# Patient Record
Sex: Female | Born: 1935 | Race: White | Hispanic: No | Smoking: Never smoker
Health system: Southern US, Community
[De-identification: ages and names within clinical notes are randomized; demographics above are authoritative.]

## PROBLEM LIST (undated history)

## (undated) ENCOUNTER — Emergency Department: Discharge status: Absent without leave | Payer: Medicare Other

## (undated) DIAGNOSIS — I38 Endocarditis, valve unspecified: Secondary | ICD-10-CM

## (undated) DIAGNOSIS — I5189 Other ill-defined heart diseases: Secondary | ICD-10-CM

## (undated) DIAGNOSIS — R51 Headache with orthostatic component, not elsewhere classified: Secondary | ICD-10-CM

## (undated) DIAGNOSIS — R52 Pain, unspecified: Secondary | ICD-10-CM

## (undated) DIAGNOSIS — I779 Disorder of arteries and arterioles, unspecified: Secondary | ICD-10-CM

## (undated) DIAGNOSIS — E039 Hypothyroidism, unspecified: Secondary | ICD-10-CM

## (undated) DIAGNOSIS — I209 Angina pectoris, unspecified: Secondary | ICD-10-CM

## (undated) DIAGNOSIS — E785 Hyperlipidemia, unspecified: Secondary | ICD-10-CM

## (undated) DIAGNOSIS — I447 Left bundle-branch block, unspecified: Secondary | ICD-10-CM

## (undated) DIAGNOSIS — K551 Chronic vascular disorders of intestine: Secondary | ICD-10-CM

## (undated) DIAGNOSIS — R42 Dizziness and giddiness: Secondary | ICD-10-CM

## (undated) DIAGNOSIS — I2510 Atherosclerotic heart disease of native coronary artery without angina pectoris: Secondary | ICD-10-CM

## (undated) DIAGNOSIS — I480 Paroxysmal atrial fibrillation: Secondary | ICD-10-CM

## (undated) DIAGNOSIS — H9190 Unspecified hearing loss, unspecified ear: Secondary | ICD-10-CM

## (undated) DIAGNOSIS — M1990 Unspecified osteoarthritis, unspecified site: Secondary | ICD-10-CM

## (undated) DIAGNOSIS — I10 Essential (primary) hypertension: Secondary | ICD-10-CM

## (undated) DIAGNOSIS — K819 Cholecystitis, unspecified: Secondary | ICD-10-CM

## (undated) DIAGNOSIS — J439 Emphysema, unspecified: Secondary | ICD-10-CM

## (undated) DIAGNOSIS — R011 Cardiac murmur, unspecified: Secondary | ICD-10-CM

## (undated) DIAGNOSIS — K219 Gastro-esophageal reflux disease without esophagitis: Secondary | ICD-10-CM

## (undated) HISTORY — DX: Other ill-defined heart diseases: I51.89

## (undated) HISTORY — DX: Hyperlipidemia, unspecified: E78.5

## (undated) HISTORY — DX: Disorder of arteries and arterioles, unspecified: I77.9

## (undated) HISTORY — PX: CATARACT EXTRACTION: SUR2

## (undated) HISTORY — DX: Endocarditis, valve unspecified: I38

## (undated) HISTORY — DX: Dizziness and giddiness: R42

## (undated) HISTORY — PX: CORONARY ANGIOPLASTY: SHX604

## (undated) HISTORY — DX: Cholecystitis, unspecified: K81.9

## (undated) HISTORY — DX: Headache: R51

## (undated) HISTORY — DX: Essential (primary) hypertension: I10

## (undated) HISTORY — PX: KNEE SURGERY: SHX244

## (undated) HISTORY — DX: Atherosclerotic heart disease of native coronary artery without angina pectoris: I25.10

## (undated) HISTORY — DX: Cardiac murmur, unspecified: R01.1

## (undated) HISTORY — DX: Left bundle-branch block, unspecified: I44.7

## (undated) HISTORY — DX: Chronic vascular disorders of intestine: K55.1

## (undated) HISTORY — PX: BREAST BIOPSY: SHX20

## (undated) HISTORY — DX: Gastro-esophageal reflux disease without esophagitis: K21.9

## (undated) HISTORY — DX: Headache with orthostatic component, not elsewhere classified: R51.0

## (undated) HISTORY — DX: Paroxysmal atrial fibrillation: I48.0

## (undated) HISTORY — DX: Emphysema, unspecified: J43.9

---
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
## 1968-07-17 HISTORY — PX: OTHER SURGICAL HISTORY: SHX169

## 1998-07-17 HISTORY — PX: CARDIAC CATHETERIZATION: SHX172

## 1999-07-18 HISTORY — PX: CORONARY ARTERY BYPASS GRAFT: SHX141

## 2004-05-10 ENCOUNTER — Ambulatory Visit: Payer: Self-pay | Admitting: Ophthalmology

## 2004-05-17 ENCOUNTER — Ambulatory Visit: Payer: Self-pay | Admitting: Ophthalmology

## 2004-10-31 ENCOUNTER — Ambulatory Visit: Payer: Self-pay | Admitting: Internal Medicine

## 2005-12-01 ENCOUNTER — Ambulatory Visit: Payer: Self-pay | Admitting: Internal Medicine

## 2006-09-21 ENCOUNTER — Ambulatory Visit: Payer: Self-pay | Admitting: Cardiology

## 2007-02-15 ENCOUNTER — Ambulatory Visit: Payer: Self-pay | Admitting: Internal Medicine

## 2007-02-21 ENCOUNTER — Ambulatory Visit: Payer: Self-pay | Admitting: Internal Medicine

## 2008-03-17 ENCOUNTER — Ambulatory Visit: Payer: Self-pay | Admitting: Cardiology

## 2008-03-24 ENCOUNTER — Ambulatory Visit: Payer: Self-pay

## 2008-03-26 ENCOUNTER — Ambulatory Visit: Payer: Self-pay | Admitting: Internal Medicine

## 2008-09-22 ENCOUNTER — Ambulatory Visit: Payer: Self-pay | Admitting: Cardiology

## 2009-04-07 ENCOUNTER — Ambulatory Visit: Payer: Self-pay | Admitting: Internal Medicine

## 2009-04-15 ENCOUNTER — Encounter: Payer: Self-pay | Admitting: Cardiology

## 2009-04-15 ENCOUNTER — Ambulatory Visit: Payer: Self-pay | Admitting: Cardiology

## 2009-04-15 DIAGNOSIS — I25810 Atherosclerosis of coronary artery bypass graft(s) without angina pectoris: Secondary | ICD-10-CM | POA: Insufficient documentation

## 2009-04-15 DIAGNOSIS — I447 Left bundle-branch block, unspecified: Secondary | ICD-10-CM

## 2009-07-17 HISTORY — PX: BREAST BIOPSY: SHX20

## 2010-04-12 ENCOUNTER — Ambulatory Visit: Payer: Self-pay | Admitting: Internal Medicine

## 2010-04-13 ENCOUNTER — Ambulatory Visit: Payer: Self-pay | Admitting: Internal Medicine

## 2010-05-16 ENCOUNTER — Ambulatory Visit: Payer: Self-pay | Admitting: General Surgery

## 2010-06-10 ENCOUNTER — Ambulatory Visit: Payer: Self-pay | Admitting: Internal Medicine

## 2010-06-10 ENCOUNTER — Encounter: Payer: Self-pay | Admitting: Internal Medicine

## 2010-06-10 DIAGNOSIS — K219 Gastro-esophageal reflux disease without esophagitis: Secondary | ICD-10-CM

## 2010-06-10 DIAGNOSIS — R011 Cardiac murmur, unspecified: Secondary | ICD-10-CM | POA: Insufficient documentation

## 2010-08-16 NOTE — Assessment & Plan Note (Signed)
Summary: EC6/AMD   Visit Type:  Follow-up Referring Provider:  Maisie Fus Wall,M.D. Primary Provider:  Elio Forget.  CC:  Denies chest pain or shortness of breath..  History of Present Illness: Kayla [name] is an 77 y/o woman with h/o CAD s/p CABG in 2001 at [facility], LBBB, GERD and HTN. Myoview in 2009 was normal.   Previously followed by Dr. Daleen Squibb. Here for routine f/u.   Doing well. 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 Denies CP or SOB. No problems with meds. Only complaint is that she has severe GERD which is well controlled with Zantac. BP well controlled. Cholesterol recently checked by Dr. Welton Flakes - and was told it was good.   Current Medications (verified): 1)  Levothyroxine Sodium 100 Mcg Tabs (Levothyroxine Sodium) .[name].. 1 By Mouth Once Daily 2)  Amlodipine Besylate 2.5 Mg Tabs (Amlodipine Besylate) .... Take One Tablet By Mouth Daily 3)  Diovan Hct 320-12.5 Mg Tabs (Valsartan-Hydrochlorothiazide) .[name].. 1 By Mouth Once Daily 4)  Aspir-Low 81 Mg Tbec (Aspirin) .... One Tablet Once Daily 5)  B Complex-B12  Tabs (B Complex Vitamins) .... Daily 6)  Zantac 150 Mg Tabs (Ranitidine Hcl) .... One Tablet Once Daily 7)  Vitamin D3 2000 Unit Caps (Cholecalciferol) .... One Tablet Once Daily 8)  Calcium/vitamin D/minerals 600-200 Mg-Unit Tabs (Calcium Carbonate-Vit D-Min) .... One Tablet Daily  Allergies (verified): No Known Drug Allergies  Past History:  Past Medical History: Last updated: April 23, 2009 CAD Hypertension Mild orthostatics Chronic left bundle branch block  Past Surgical History: Last updated: 2009/04/23 CBAG Right leg steel plate implant in right eye thyroid   Family History: Last updated: 04/23/2009 Father: deceased 70; accident Mother:deceased 79; heart failure Siblings:4 sisters-livinig 1 sister deceaed 59; heart failure 1 brother deceased 26; heart attack 1 brother: deceaed 53; freak accident  Social History: Last updated: 04-23-09 Part Time   Widowed  Tobacco Use - No.  Alcohol Use - no Regular Exercise - yes Drug Use - no  Risk Factors: Exercise: yes (04/23/2009)  Risk Factors: Smoking Status: never (April 23, 2009)  Review of Systems       As per HPI and past medical history; otherwise all systems negative.   Vital Signs:  Patient profile:   77 year old female Height:      62 inches Weight:      166.50 pounds BMI:     30.56 Pulse rate:   62 / minute BP sitting:   120 / 68  (left arm) Cuff size:   large  Vitals Entered By: Bishop Dublin, CMA (June 10, 2010 12:41 PM)  Physical Exam  General:  Elderly well appearing. no resp difficulty HEENT: normal Neck: supple. no JVD. Carotids 2+ bilat; no bruits. No lymphadenopathy or thryomegaly appreciated. Cor: PMI nondisplaced. Regular rate & rhythm. No rubs, gallops. soft 2/6 SEM at RUSB. S2 preserved  Lungs: clear Abdomen: soft, nontender, nondistended. No hepatosplenomegaly. No bruits or masses. Good bowel sounds. Extremities: no cyanosis, clubbing, rash, edema Neuro: alert & orientedx3, cranial nerves grossly intact. moves all 4 extremities w/o difficulty. affect pleasant    Impression & Recommendations:  Problem # 1:  CAD, AUTOLOGOUS BYPASS GRAFT (ICD-414.02) Stable. No evidence of ischemia. Continue current regimen.  Problem # 2:  MURMUR (ICD-785.2) Appears to have mild aortic sclerosis on exam - doubt signifcant stenosis. Will continue to follow. Can cosnider ech in future as needed.   Problem # 3:  HEARTBURN (ICD-787.1) Suggested that if this gets worse may want to try PPI rather than  H2 blocker. She will f/u with Dr. Park Breed.   Patient Instructions: 1)  Your physician recommends that you schedule a follow-up appointment in: 1 year 2)  Your physician recommends that you continue on your current medications as directed. Please refer to the Current Medication list given to you today.

## 2010-11-23 ENCOUNTER — Ambulatory Visit: Payer: Self-pay | Admitting: General Surgery

## 2010-11-29 NOTE — Assessment & Plan Note (Signed)
[facility] OFFICE NOTE   NAME:Kayla [name]                        MRN:          [id number]  DATE:03/17/2008                            DOB:          1927/07/16    [name] returns today for further management of her coronary artery  disease and left bundle-branch block.   I saw her last on September 21, 2006.  Since that time, she has had some  increased fatigue and dyspnea on exertion.  She also has some occasional  exertional chest discomfort when she gets on her exercise bike.  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   She is status post coronary artery bypass grafting x3 by Dr. Quincy Simmonds at [facility] in 2001.  I have yet to receive records that  requested.   She has no known allergies.   Her current meds are:  1. Centrum Multivitamin.  2. Levothyroxine 100 mcg a day.  3. B12 shot every 30 days.  4. Aspirin 81 mg a day.  5. Triamterene/hydrochlorothiazide 37.5/25 mg 1 daily.  6. Diovan 160 mg a day.   She does not smoke.  She does use caffeine.   PHYSICAL EXAMINATION:  GENERAL:  Today, she is very pleasant.  VITAL SIGNS:  Her blood pressure is 140/80, pulse 64 and regular, and  she weighs 173, which is actually up 16 pounds since a 77-year and half  ago.  HEENT:  Normocephalic and atraumatic.  PERRLA.  Extraocular movements  are intact.  Sclerae are clear.  Facial symmetry is normal.  Dentition  satisfactory.  NECK:  Carotid upstrokes are equal bilaterally without bruits.  No JVD.  Thyroid is not enlarged.  Trachea is midline.  LUNGS:  Clear to auscultation and percussion.  HEART:  A nondisplaced PMI.  There is a soft systolic murmur along the  left sternal border of no clinical consequence.  S2 splits.  ABDOMEN:  Soft.  Good bowel sounds.  No pulsatile mass.  EXTREMITIES:  No edema.  Pulses are present.  She has some venous  varicosities.  No sign of  DVT.  MUSCULOSKELETAL:  No major thoracic or back deformities.  She does have  chronic arthritic changes.  SKIN:  No significant ecchymoses.   EKG shows normal sinus rhythm with a left bundle, which is old.   ASSESSMENT AND PLAN:  Kayla [name] seems to be having maybe some  exertional angina.  That was hard to sort out.  Her weight has gone up  substantially.  A lot of this could be just deconditioning.   PLAN:  1. Adenosine rest stress Myoview.  We need to quantitate her LV      function as well as evaluate any significant ischemia.  We have to      interpret this in light of her left bundle.  2. I have made no change in her medical program.   Assuming that her adenosine Myoview is unremarkable, we will see her  back in a year.     Maisie Fus  Argie Ramming, MD, [facility]  Electronically Signed    TCW/MedQ  DD: 03/17/2008  DT: 03/18/2008  Job #: [id number]   cc:   Beverely Risen, MD

## 2010-11-29 NOTE — Assessment & Plan Note (Signed)
[facility] OFFICE NOTE   NAME:Kayla [name], Kayla [name]                        MRN:          [id number]  DATE:09/22/2008                            DOB:          [birth year]    Kayla [name] comes in today.  She has had some nonspecific dizziness,  sometimes posture related but mostly just spontaneously.  She has had a  little bit of chest tightness here and there, but is not clearly  exertional.   She has known coronary artery disease.  Last visit, please refer to that  note, we were concerned about some inconsistent chest discomfort.  Stress Myoview showed EF of 68% with no ischemia.   She saw Dr. Beverely Risen, who ordered a Holter monitor.  I have reviewed  this in entirety.  It shows sinus rhythm, sinus brady, and her left  bundle-branch block, which is old.  Her symptoms correlate with sinus  brady and sinus rhythm.  I have reviewed these and agreed with the  findings.  She does have some PVCs, but they are infrequent.  She also  has a PACs, but no significant sustained SVT and certainly no V-tach.   Her blood pressure has been elevated and she has been having some  headaches.  Dr. Welton Flakes advised to go up on her Diovan from 160 to 320, but  she has not done that yet.   Her daughter is with her today.   Her meds are the same as last visit, except now she is on Flonase,  Zyrtec, and some calcium and D.   PHYSICAL EXAMINATION:  VITAL SIGNS:  Her blood pressure today is 180/80,  her pulse is 66 and regular, and her weight is 170.4.  GENERAL:  She is alert and oriented x3.  She is overweight.  Skin is  warm and dry.  Her affect is depressed.  She is a poor historian.  HEENT:  Essentially normal.  NECK:  Supple.  Carotid upstrokes were equal bilaterally without bruits.  Thyroid is not enlarged.  Trachea is midline.  LUNGS:  Clear to auscultation and percussion.  HEART:  A nondisplaced PMI, normal S1 paradoxus S2.  ABDOMEN:   Soft, good bowel sounds.  EXTREMITIES:  No edema.  Pulses are present.   Apparently, she has had a carotid Doppler which showed minimal plaque.  I do not have a copy of this.   EKG demonstrates normal sinus rhythm with a left bundle, which is old.   ASSESSMENT:  1. Essential hypertension, poorly controlled systolically.  This may      be associated with some of her symptoms.  2. Mild orthostatic symptoms.  3. Stable coronary artery disease.  4. Chronic left bundle-branch block.  5. Symptoms of dizziness, do not correlate with an arrhythmia.   RECOMMENDATIONS:  1. Increase Diovan as outlined by Dr. Welton Flakes.  2. Check blood pressure at home with a goal of less than or equal to      140/90.   I will see her back in 6 months.      Thomas C. Wall,  MD, [facility]  Electronically Signed    TCW/MedQ  DD: 09/22/2008  DT: 09/22/2008  Job #: [id number]   cc:   Beverely Risen, MD

## 2010-12-02 NOTE — Assessment & Plan Note (Signed)
[facility]                            [place] OFFICE NOTE   NAME:Kayla [name], Kayla [name]                        MRN:          [id number]  DATE:09/21/2006                            DOB:          03-31-1927    I was asked by Dr. Welton Flakes to evaluate and establish the new patient,  Kayla [name].  She is a delightful 77 year old widowed white female,  mother of a patient of mine, who comes today for the above reason.   She is having no complaints of angina or ischemia.  She denies any  orthopnea, PND, or peripheral edema.   She has had coronary artery bypass grafting x3 by Dr. Quincy Simmonds at  [facility] in 2001.  Details are not known.  She has also been told  that she has a leaky valve, but really does not need to be too concerned  about it.   PAST MEDICAL HISTORY:  She has no known dye allergies.   CURRENT MEDICATIONS:  1. Centrum.  2. Multivitamin.  3. Aspirin 325 a day.  4. Levoxyl 112 mcg a day.  5. Diovan 160 daily.  6. Triamterine/hydrochlorothiazide 37.5/25 daily.   She does not smoke, drink, or use any illicit drugs.  She does use  caffeine.  She does enjoy her stationary bike and walking.   Her previous surgeries, other than her bypass, she has had a steel plate  put in her right leg.  She has had a thyroidectomy in 1979.   FAMILY HISTORY:  Remarkable for heart disease, particularly prematurely  in her brother, who died at age 39.   SOCIAL HISTORY:  She is retired.  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  She  is widowed and has 6 children.  She lives just down the street here in  [place].   REVIEW OF SYSTEMS:  All review of systems points were reviewed.  She  does have chronic arthritis and the thyroid disease mentioned above.   EXAM:  Her blood pressure is 126/78, pulse is 68 and regular, weight is  157.  She is 5 feet 1 inches.  HEENT:  Normocephalic, atraumatic.  PERRLA.  Extraocular muscles are  intact.  Sclerae clear.  Facial  symmetry is normal.  PERRLA.  Extraocular movements intact.  Carotid upstrokes are equal bilaterally without bruits.  There is no  JVD.  Thyroid is not enlarged.  Neck is supple.  LUNGS:  Clear to auscultation.  HEART:  Reveals a normal S1 and a paradoxically split S2.  She has a  left bundle on her EKG.  There is a soft systolic murmur along the left  sternal border.  I could not here any mitral regurgitation murmur.  ABDOMEN:  Soft with good bowel sounds.  No midline bruit.  There is no  hepatomegaly.  EXTREMITIES:  No cyanosis or clubbing.  She does have 1+ bilateral  pitting edema.  SHE has some distal varicosities in the lower  extremities.  NEURO:  Exam is intact.   EKG shows normal sinus rhythm with a left bundle branch block.   ASSESSMENT:  1. Coronary  artery disease status post coronary artery bypass grafting      x3, details unknown.  She is currently having no symptoms of      angina, ischemia, or congestive heart failure.  2. Hypertension, well-controlled.  3. Question lipid status.  She is not on a Statin, but she says that      her cholesterol is really good.  I will leave this to Dr. Welton Flakes.  4. Left bundle branch block.  I assume this is old.   Her last evaluation was by Dr. Welton Flakes, cardiologist here in [place].  We will send request for his records, including what sounds like an  echocardiogram, as well as an EKG and perhaps a stress test.   I will see her back again in a year otherwise.     Thomas C. Daleen Squibb, MD, [facility]  Electronically Signed    TCW/MedQ  DD: 09/21/2006  DT: 09/21/2006  Job #: [id number]   cc:   Dr. Everlean Cherry

## 2011-01-02 ENCOUNTER — Encounter: Payer: Self-pay | Admitting: Cardiovascular Disease

## 2011-06-01 ENCOUNTER — Ambulatory Visit: Payer: Self-pay | Admitting: General Surgery

## 2011-06-23 ENCOUNTER — Encounter: Payer: Self-pay | Admitting: Cardiovascular Disease

## 2011-07-07 ENCOUNTER — Ambulatory Visit (INDEPENDENT_AMBULATORY_CARE_PROVIDER_SITE_OTHER): Payer: Medicare PPO | Admitting: Cardiovascular Disease

## 2011-07-07 ENCOUNTER — Encounter: Payer: Self-pay | Admitting: Cardiovascular Disease

## 2011-07-07 DIAGNOSIS — E785 Hyperlipidemia, unspecified: Secondary | ICD-10-CM | POA: Insufficient documentation

## 2011-07-07 DIAGNOSIS — R011 Cardiac murmur, unspecified: Secondary | ICD-10-CM

## 2011-07-07 DIAGNOSIS — R12 Heartburn: Secondary | ICD-10-CM

## 2011-07-07 DIAGNOSIS — E782 Mixed hyperlipidemia: Secondary | ICD-10-CM | POA: Insufficient documentation

## 2011-07-07 DIAGNOSIS — I25810 Atherosclerosis of coronary artery bypass graft(s) without angina pectoris: Secondary | ICD-10-CM

## 2011-07-07 MED ORDER — LOVASTATIN 20 MG PO TABS: 20.0000 mg | ORAL_TABLET | Freq: Every day | ORAL | Status: DC

## 2011-07-07 NOTE — Assessment & Plan Note (Signed)
Suspect mid degree of aortic valve stenosis. No symptoms.

## 2011-07-07 NOTE — Assessment & Plan Note (Signed)
Not at goal LDL <70. H/o CABG Will start lovastatin 20 mg daily

## 2011-07-07 NOTE — Patient Instructions (Signed)
You are doing well. Please start lovastatin one a day in the evening  Please call us if you have new issues that need to be addressed before your next appt.  Your physician wants you to follow-up in: 6 months.  You will receive a reminder letter in the mail two months in advance. If you don't receive a letter, please call our office to schedule the follow-up appointment.

## 2011-07-07 NOTE — Assessment & Plan Note (Signed)
Currently with no symptoms of angina. No further workup at this time. Not on a statin.

## 2011-07-07 NOTE — Progress Notes (Signed)
Patient ID: Kayla [name], female    DOB: 1926/12/06, 77 y.o.   MRN: [id number]  HPI Comments: Kayla [name] is an 77 y/o woman with h/o CAD s/p CABG in 2001 at [facility], LBBB, hyperlipidemia, GERD and HTN.  Myoview in 2009 was normal.  Here for routine f/u.     Doing well. 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 Denies CP or SOB. No problems with meds.BP well controlled. She does report that the diovan is expensive even though it is supposed to be generic. "My cholesterol is god I was told".  EKG shows NSR with LBBB, rate 63 bpm  CHol 177, LDL 104, HDL 53      Outpatient Encounter Prescriptions as of 07/07/2011  Medication Sig Dispense Refill  . amLODipine (NORVASC) 2.5 MG tablet Take 2.5 mg by mouth daily.        [name] aspirin 81 MG tablet Take 81 mg by mouth daily.        . B Complex Vitamins (B COMPLEX-B12) TABS Take 1 tablet by mouth daily.        . Calcium Carbonate-Vitamin D (CALCIUM-VITAMIN D) 600-200 MG-UNIT CAPS Take 1 tablet by mouth daily.        . Cholecalciferol (VITAMIN D3) 2000 UNITS TABS Take 1 tablet by mouth daily.        . Garlic 1000 MG CAPS Take 1 capsule by mouth.        . levothyroxine (SYNTHROID, LEVOTHROID) 100 MCG tablet Take 100 mcg by mouth daily.        . ranitidine (ZANTAC) 150 MG capsule Take 150 mg by mouth daily.        . valsartan-hydrochlorothiazide (DIOVAN-HCT) 320-12.5 MG per tablet Take 1 tablet by mouth daily.           Review of Systems  Constitutional: Negative.   HENT: Negative.   Eyes: Negative.   Respiratory: Negative.   Cardiovascular: Negative.   Gastrointestinal: Negative.   Musculoskeletal: Negative.   Skin: Negative.   Neurological: Negative.   Hematological: Negative.   Psychiatric/Behavioral: Negative.   All other systems reviewed and are negative.    BP 128/70  Pulse 63  Ht 5\' 2"  (1.575 m)  Wt 168 lb 1.9 oz (76.259 kg)  BMI 30.75 kg/m2   Physical Exam  Nursing note and vitals reviewed. Constitutional: She is  oriented to person, place, and time. She appears well-developed and well-nourished.  HENT:  Head: Normocephalic.  Nose: Nose normal.  Mouth/Throat: Oropharynx is clear and moist.  Eyes: Conjunctivae are normal. Pupils are equal, round, and reactive to light.  Neck: Normal range of motion. Neck supple. No JVD present.  Cardiovascular: Normal rate, regular rhythm, S1 normal, S2 normal and intact distal pulses.  Exam reveals no gallop and no friction rub.   Murmur heard.  Crescendo systolic murmur is present with a grade of 2/6       Nonpitting LE swelling  Pulmonary/Chest: Effort normal and breath sounds normal. No respiratory distress. She has no wheezes. She has no rales. She exhibits no tenderness.  Abdominal: Soft. Bowel sounds are normal. She exhibits no distension. There is no tenderness.  Musculoskeletal: Normal range of motion. She exhibits edema. She exhibits no tenderness.  Lymphadenopathy:    She has no cervical adenopathy.  Neurological: She is alert and oriented to person, place, and time. Coordination normal.  Skin: Skin is warm and dry. No rash noted. No erythema.  Psychiatric: She has a normal mood and affect. Her  behavior is normal. Judgment and thought content normal.         Assessment and Plan

## 2012-03-22 ENCOUNTER — Encounter: Payer: Self-pay | Admitting: Cardiovascular Disease

## 2012-03-22 ENCOUNTER — Ambulatory Visit (INDEPENDENT_AMBULATORY_CARE_PROVIDER_SITE_OTHER): Payer: Medicare Other | Admitting: Cardiovascular Disease

## 2012-03-22 VITALS — BP 110/60 | HR 71 | Ht 62.0 in | Wt 160.5 lb

## 2012-03-22 DIAGNOSIS — R011 Cardiac murmur, unspecified: Secondary | ICD-10-CM

## 2012-03-22 DIAGNOSIS — E785 Hyperlipidemia, unspecified: Secondary | ICD-10-CM

## 2012-03-22 DIAGNOSIS — I25810 Atherosclerosis of coronary artery bypass graft(s) without angina pectoris: Secondary | ICD-10-CM

## 2012-03-22 DIAGNOSIS — I447 Left bundle-branch block, unspecified: Secondary | ICD-10-CM

## 2012-03-22 NOTE — Assessment & Plan Note (Signed)
Currently with no symptoms of angina. No further workup at this time. Continue current medication regimen. 

## 2012-03-22 NOTE — Assessment & Plan Note (Signed)
Chronic left bundle branch block. No further workup at this time 

## 2012-03-22 NOTE — Assessment & Plan Note (Signed)
Currently on low-dose statin. Goal LDL less than 70. Most recent lipids not available. Repeat blood work scheduled with PMD December 2013

## 2012-03-22 NOTE — Assessment & Plan Note (Signed)
Murmur likely from aortic valve sclerosis. No symptoms. We did discuss echocardiogram. She would like to do this on her next visit.

## 2012-03-22 NOTE — Patient Instructions (Addendum)
You are doing well. No medication changes were made. If you continue to have dizzy episodes, cut the losartan HCT in 1/2 or hold the amlodipine  Please call us if you have new issues that need to be addressed before your next appt.  Your physician wants you to follow-up in: 6 months.  You will receive a reminder letter in the mail two months in advance. If you don't receive a letter, please call our office to schedule the follow-up appointment.

## 2012-03-22 NOTE — Progress Notes (Signed)
Patient ID: Kayla [name], female    DOB: Nov 09, 1926, 78 y.o.   MRN: [id number]  HPI Comments: Kayla [name] is an 78 y/o woman with h/o CAD s/p CABG in 2001 at [facility], LBBB, hyperlipidemia, GERD and HTN.  Myoview in 2009 was normal.  Here for routine f/u.      she reports that she is doing well. She does not do regular exercise. She does have occasional dizziness when standing up. She had significant stress when she was recently in [place] for several months at a time. She is scheduled to have blood work in December 2013.  EKG shows NSR with LBBB, rate 72 bpm  Previous  CHol 177, LDL 104, HDL 53      Outpatient Encounter Prescriptions as of 03/22/2012  Medication Sig Dispense Refill  . amLODipine (NORVASC) 2.5 MG tablet Take 2.5 mg by mouth daily.        [name] aspirin 81 MG tablet Take 81 mg by mouth daily.        . B Complex Vitamins (B COMPLEX-B12) TABS Take 1 tablet by mouth daily.        . Calcium Carbonate-Vitamin D (CALCIUM-VITAMIN D) 600-200 MG-UNIT CAPS Take 1 tablet by mouth daily.        . Cholecalciferol (VITAMIN D3) 2000 UNITS TABS Take 1 tablet by mouth daily.        . Garlic 1000 MG CAPS Take 1 capsule by mouth.        . levothyroxine (SYNTHROID, LEVOTHROID) 100 MCG tablet Take 100 mcg by mouth daily.        [name] losartan-hydrochlorothiazide (HYZAAR) 100-25 MG per tablet Take 1 tablet by mouth daily.      [name] lovastatin (MEVACOR) 20 MG tablet Take 1 tablet (20 mg total) by mouth at bedtime.  30 tablet  11  . ranitidine (ZANTAC) 150 MG capsule Take 150 mg by mouth daily.        [name] DISCONTD: valsartan-hydrochlorothiazide (DIOVAN-HCT) 320-12.5 MG per tablet Take 1 tablet by mouth daily.           Review of Systems  Constitutional: Negative.   HENT: Negative.   Eyes: Negative.   Respiratory: Negative.   Cardiovascular: Negative.   Gastrointestinal: Negative.   Musculoskeletal: Negative.   Skin: Negative.   Neurological: Positive for dizziness.  Hematological: Negative.     Psychiatric/Behavioral: Negative.   All other systems reviewed and are negative.    BP 110/60  Pulse 71  Ht 5\' 2"  (1.575 m)  Wt 160 lb 8 oz (72.802 kg)  BMI 29.36 kg/m2  Physical Exam  Nursing note and vitals reviewed. Constitutional: She is oriented to person, place, and time. She appears well-developed and well-nourished.  HENT:  Head: Normocephalic.  Nose: Nose normal.  Mouth/Throat: Oropharynx is clear and moist.  Eyes: Conjunctivae are normal. Pupils are equal, round, and reactive to light.  Neck: Normal range of motion. Neck supple. No JVD present.  Cardiovascular: Normal rate, regular rhythm, S1 normal, S2 normal and intact distal pulses.  Exam reveals no gallop and no friction rub.   Murmur heard.  Crescendo systolic murmur is present with a grade of 2/6  Pulmonary/Chest: Effort normal and breath sounds normal. No respiratory distress. She has no wheezes. She has no rales. She exhibits no tenderness.  Abdominal: Soft. Bowel sounds are normal. She exhibits no distension. There is no tenderness.  Musculoskeletal: Normal range of motion. She exhibits no edema and no tenderness.  Lymphadenopathy:    She  has no cervical adenopathy.  Neurological: She is alert and oriented to person, place, and time. Coordination normal.  Skin: Skin is warm and dry. No rash noted. No erythema.  Psychiatric: She has a normal mood and affect. Her behavior is normal. Judgment and thought content normal.         Assessment and Plan

## 2012-06-03 ENCOUNTER — Ambulatory Visit: Payer: Self-pay

## 2012-07-17 HISTORY — PX: UPPER GI ENDOSCOPY: SHX6162

## 2012-09-14 ENCOUNTER — Observation Stay (HOSPITAL_COMMUNITY)
Admission: EM | Admit: 2012-09-14 | Discharge: 2012-09-15 | Disposition: A | Discharge status: Home / Self Care | Payer: Medicare Other | Attending: Internal Medicine | Admitting: Internal Medicine

## 2012-09-14 ENCOUNTER — Other Ambulatory Visit: Payer: Self-pay

## 2012-09-14 ENCOUNTER — Emergency Department (HOSPITAL_COMMUNITY): Payer: Medicare Other

## 2012-09-14 ENCOUNTER — Encounter (HOSPITAL_COMMUNITY): Payer: Self-pay | Admitting: *Deleted

## 2012-09-14 DIAGNOSIS — R0989 Other specified symptoms and signs involving the circulatory and respiratory systems: Secondary | ICD-10-CM | POA: Insufficient documentation

## 2012-09-14 DIAGNOSIS — I209 Angina pectoris, unspecified: Secondary | ICD-10-CM | POA: Insufficient documentation

## 2012-09-14 DIAGNOSIS — I2510 Atherosclerotic heart disease of native coronary artery without angina pectoris: Principal | ICD-10-CM | POA: Insufficient documentation

## 2012-09-14 DIAGNOSIS — R0609 Other forms of dyspnea: Secondary | ICD-10-CM | POA: Insufficient documentation

## 2012-09-14 DIAGNOSIS — I10 Essential (primary) hypertension: Secondary | ICD-10-CM | POA: Insufficient documentation

## 2012-09-14 DIAGNOSIS — R0789 Other chest pain: Secondary | ICD-10-CM

## 2012-09-14 HISTORY — DX: Unspecified osteoarthritis, unspecified site: M19.90

## 2012-09-14 LAB — CBC WITH DIFFERENTIAL/PLATELET
Basophils Absolute: 0 10*3/uL (ref 0.0–0.1)
Eosinophils Relative: 4 % (ref 0–5)
Lymphocytes Relative: 18 % (ref 12–46)
Neutro Abs: 4.8 10*3/uL (ref 1.7–7.7)
Platelets: 162 10*3/uL (ref 150–400)
RDW: 13.6 % (ref 11.5–15.5)
WBC: 6.9 10*3/uL (ref 4.0–10.5)

## 2012-09-14 LAB — COMPREHENSIVE METABOLIC PANEL
ALT: 12 U/L (ref 0–35)
AST: 16 U/L (ref 0–37)
Albumin: 3.5 g/dL (ref 3.5–5.2)
CO2: 27 mEq/L (ref 19–32)
Calcium: 9.1 mg/dL (ref 8.4–10.5)
Chloride: 101 mEq/L (ref 96–112)
GFR calc non Af Amer: 32 mL/min — ABNORMAL LOW (ref >90)
Sodium: 138 mEq/L (ref 135–145)

## 2012-09-14 LAB — POCT I-STAT TROPONIN I

## 2012-09-14 LAB — PROTIME-INR: INR: 0.95 (ref 0.00–1.49)

## 2012-09-14 LAB — TROPONIN I: Troponin I: <0.3 ng/mL (ref <0.30)

## 2012-09-14 MED ORDER — AMLODIPINE BESYLATE 2.5 MG PO TABS
2.5000 mg | ORAL_TABLET | Freq: Every day | ORAL | Status: DC
  Administered 2012-09-14 – 2012-09-15 (×2): 2.5 mg via ORAL
  Filled 2012-09-14 (×2): qty 1

## 2012-09-14 MED ORDER — LOSARTAN POTASSIUM-HCTZ 100-25 MG PO TABS: 1.0000 | ORAL_TABLET | Freq: Every day | ORAL | Status: DC

## 2012-09-14 MED ORDER — NITROGLYCERIN 2 % TD OINT
1.0000 [in_us] | TOPICAL_OINTMENT | Freq: Once | TRANSDERMAL | Status: AC
  Administered 2012-09-14: 1 [in_us] via TOPICAL
  Filled 2012-09-14: qty 1

## 2012-09-14 MED ORDER — FAMOTIDINE 20 MG PO TABS
20.0000 mg | ORAL_TABLET | Freq: Two times a day (BID) | ORAL | Status: DC
  Administered 2012-09-14 – 2012-09-15 (×3): 20 mg via ORAL
  Filled 2012-09-14 (×4): qty 1

## 2012-09-14 MED ORDER — ASPIRIN 81 MG PO CHEW
81.0000 mg | CHEWABLE_TABLET | Freq: Every day | ORAL | Status: DC
  Administered 2012-09-14 – 2012-09-15 (×2): 81 mg via ORAL
  Filled 2012-09-14 (×2): qty 1

## 2012-09-14 MED ORDER — ENOXAPARIN SODIUM 40 MG/0.4ML ~~LOC~~ SOLN
40.0000 mg | SUBCUTANEOUS | Status: DC
  Administered 2012-09-14 – 2012-09-15 (×2): 40 mg via SUBCUTANEOUS
  Filled 2012-09-14 (×2): qty 0.4

## 2012-09-14 MED ORDER — MORPHINE SULFATE 2 MG/ML IJ SOLN
2.0000 mg | Freq: Once | INTRAMUSCULAR | Status: AC
  Administered 2012-09-14: 2 mg via INTRAVENOUS
  Filled 2012-09-14: qty 1

## 2012-09-14 MED ORDER — LEVOTHYROXINE SODIUM 100 MCG PO TABS
100.0000 ug | ORAL_TABLET | Freq: Every day | ORAL | Status: DC
Start: 2012-09-14 — End: 2012-09-15
  Administered 2012-09-14 – 2012-09-15 (×2): 100 ug via ORAL
  Filled 2012-09-14 (×3): qty 1

## 2012-09-14 MED ORDER — HYDROCHLOROTHIAZIDE 25 MG PO TABS
25.0000 mg | ORAL_TABLET | Freq: Every day | ORAL | Status: DC
  Administered 2012-09-14 – 2012-09-15 (×2): 25 mg via ORAL
  Filled 2012-09-14 (×2): qty 1

## 2012-09-14 MED ORDER — SODIUM CHLORIDE 0.9 % IJ SOLN
3.0000 mL | Freq: Two times a day (BID) | INTRAMUSCULAR | Status: DC
  Administered 2012-09-14 – 2012-09-15 (×3): 3 mL via INTRAVENOUS

## 2012-09-14 MED ORDER — LOSARTAN POTASSIUM 50 MG PO TABS
100.0000 mg | ORAL_TABLET | Freq: Every day | ORAL | Status: DC
  Administered 2012-09-14 – 2012-09-15 (×2): 100 mg via ORAL
  Filled 2012-09-14 (×2): qty 2

## 2012-09-14 MED ORDER — SIMVASTATIN 10 MG PO TABS
10.0000 mg | ORAL_TABLET | Freq: Every day | ORAL | Status: DC
  Administered 2012-09-14: 10 mg via ORAL
  Filled 2012-09-14 (×2): qty 1

## 2012-09-14 NOTE — Plan of Care (Signed)
Problem: Phase I Progression Outcomes Goal: Anginal pain relieved Outcome: Completed/Met Date Met:  09/14/12 Pt has not had any c/o pain, goal met Goal: Aspirin unless contraindicated Outcome: Completed/Met Date Met:  09/14/12 Pt taking ASA, goal met Goal: Voiding-avoid urinary catheter unless indicated Outcome: Completed/Met Date Met:  09/14/12 Pt voiding adequate amts of urine no need for foley

## 2012-09-14 NOTE — H&P (Signed)
Patient ID: Kayla [name] MRN: [id number], DOB/AGE: [birth year]   Admit date: 09/14/2012   Primary Physician: Lyndon Code, MD Primary Cardiologist: Dr. Odis Luster   Problem List  Past Medical History  Diagnosis Date  . CAD (coronary artery disease)   . Hypertension   . Orthostatic headache     Mild  . Bundle branch block, left     Chronic    Past Surgical History  Procedure Laterality Date  . Coronary artery bypass graft  2001     Allergies  No Known Allergies  HPI  79 y/o female with a h/o CAD s/p CABG in 2001 at [facility] and no subsequent cardiac issues (normal Myoview in 2009) who p/w chest discomfort.  She describes burning/tightness across her chest that awoke her from sleep last night.  No radiation; there was associated dyspnea and nausea.  It lasted hours.  It improved with NTG.  It did not feel like her pain that she had prior to her bypass.  Otherwise, she has been in her normal state of health.  Home Medications  Prior to Admission medications   Medication Sig Start Date End Date Taking? Authorizing Provider  amLODipine (NORVASC) 2.5 MG tablet Take 2.5 mg by mouth daily.     Yes Historical Provider, MD  aspirin 81 MG tablet Take 81 mg by mouth daily.     Yes Historical Provider, MD  B Complex Vitamins (B COMPLEX-B12) TABS Take 1 tablet by mouth daily.     Yes Historical Provider, MD  calcium carbonate (TUMS - DOSED IN MG ELEMENTAL CALCIUM) 500 MG chewable tablet Chew 1 tablet by mouth daily.   Yes Historical Provider, MD  Cholecalciferol (VITAMIN D3) 2000 UNITS TABS Take 1 tablet by mouth daily.     Yes Historical Provider, MD  Garlic 1000 MG CAPS Take 1 capsule by mouth.     Yes Historical Provider, MD  levothyroxine (SYNTHROID, LEVOTHROID) 100 MCG tablet Take 100 mcg by mouth daily.     Yes Historical Provider, MD  losartan-hydrochlorothiazide (HYZAAR) 100-25 MG per tablet Take 1 tablet by mouth daily.   Yes Historical Provider, MD  lovastatin (MEVACOR) 20 MG  tablet Take 1 tablet (20 mg total) by mouth at bedtime. 07/07/11 09/14/12 Yes Antonieta Iba, MD  ranitidine (ZANTAC) 150 MG capsule Take 150 mg by mouth daily.     Yes Historical Provider, MD    Family History  Family History  Problem Relation Age of Onset  . Heart failure Mother 79  . Heart failure Sister 70  . Heart attack Brother 95    Social History  History   Social History  . Marital Status: Widowed    Spouse Name: N/A    Number of Children: N/A  . Years of Education: N/A   Occupational History  . Part time    Social History Main Topics  . Smoking status: Never Smoker   . Smokeless tobacco: Not on file  . Alcohol Use: No  . Drug Use: No  . Sexually Active: Not on file   Other Topics Concern  . Not on file   Social History Narrative   Regular exercise: Yes     Review of Systems 12 point review of systems reviewed and are otherwise negative except as noted above.  Physical Exam  Blood pressure 123/59, pulse 63, temperature 98.3 F (36.8 C), temperature source Oral, resp. rate 13, SpO2 99.00%.  General: Pleasant, NAD Psych: Normal affect. Neuro: Alert and oriented X 3. Moves  all extremities spontaneously. HEENT: Normal  Neck: Supple without bruits or JVD. Lungs:  Resp regular and unlabored; scant RLL crackles posteriorly Heart: RRR no s3, s4; 2/6 systolic murmur heard throughout precordium Abdomen: Soft, non-tender, non-distended Extremities: trace ankle edema. DP/Radials 2+ and equal bilaterally.  Labs  Troponin 0.01  Lab Results  Component Value Date   WBC 6.9 09/14/2012   HGB 11.5* 09/14/2012   HCT 33.8* 09/14/2012   MCV 89.7 09/14/2012   PLT 162 09/14/2012    Recent Labs Lab 09/14/12 0250  NA 138  K 4.3  CL 101  CO2 27  BUN 34*  CREATININE 1.48*  CALCIUM 9.1  PROT 6.1  BILITOT 0.2*  ALKPHOS 51  ALT 12  AST 16  GLUCOSE 152*     Radiology/Studies  Dg Chest Portable 1 View  09/14/2012  *RADIOLOGY REPORT*  Clinical Data: Chest pain   PORTABLE CHEST - 1 VIEW  Comparison: None.  Findings: Heart size upper normal. Hypoaeration with mild interstitial prominence.  Status post median sternotomy and CABG. Bibasilar areas of scarring or atelectasis.  Otherwise, no confluent airspace opacity.  No pleural effusion or pneumothorax. No acute osseous finding.  IMPRESSION: Heart size upper normal status post CABG.  Mild lung base atelectasis or scarring.  Mild interstitial prominence may be accentuated by hypoaeration. Mild interstitial edema not excluded.   Original Report Authenticated By: Jearld Lesch, M.D.     ECG- wandering baseline; NSR, LBBB (old), PVC  ASSESSMENT AND PLAN  1) Chest discomfort- ddx includes NSTEMI, although I doubt this is the case given initial negative troponin and given how comfortable she looks.  Nonetheless, her grafts are >2 years old.  Will repeat troponins x2 and repeat ECG.  Will not heparinize at this point.  Continue outpatient CAD meds.  2) CAD s/p CABG- continue aspirin, statin  3) HTN- continue home BP meds  4) Murmur- likely aortic sclerosis vs. Anemia (flow murmur); will get TTE as this was to be done as outpatient  5) Hypothyroid- continue home Levothyroxine  6) CKD- unknown baseline  7) Anemia- unknown baseline, no signs of bleeding  8) Prophylaxis- Lovenox 40 mg daily   Signed, Samyukta Cura, MD 09/14/2012, 6:08 AM

## 2012-09-14 NOTE — Progress Notes (Signed)
The patient arrived to 67.  The patient was oriented to the unit and placed on telemetry.  VS were taken and the patient was assessed.  The call bell was placed within reach.

## 2012-09-14 NOTE — ED Notes (Signed)
Per EMS:  Around 45 min ago pt began experiencing sub sternal, non-radiating chest pain.  Pt thought it was indigestion and took some tums without relief and that's when she called EMS.  Pt took 324 ASA at home.  En route pt had 2 SL NTG and that took her pain from 7/10 to 5/10.  Pt denies SOB.  Pt was diaphoretic.  No n/v, denies lightheadedness/dizziness.  Pt c&o x 4.

## 2012-09-14 NOTE — Progress Notes (Signed)
Patient ID: Kayla [name], female   DOB: [birth year], 79 y.o.   MRN: [id number] H and P reviewed. No chest pain. Will observe, check enzymes and plan dc tomorrow if no pain and enzymes negative for outpatient stress test.  Lewayne Bunting, M.D.

## 2012-09-14 NOTE — ED Provider Notes (Signed)
History     CSN: [id number]  Arrival date & time 09/14/12  0200   First MD Initiated Contact with Patient 09/14/12 0222      No chief complaint on file.   (Consider location/radiation/quality/duration/timing/severity/associated sxs/prior treatment) HPI  Patient reports she has 3 vessel bypass surgery done in the year 2000. She reports she's been doing well. She reports a 2345 this evening she was sleeping and was awakened with a central chest pain that she states was "hurting" and describes it as aching and burning. She states she took 1 regular aspirin at home just prior to EMS arrival and they gave her 2 sublingual nitroglycerin which brought her pain down from a 10 out of 10 to a current 1/10. She states this is similar to the chest pain she had before when she needed to have her bypass surgery. She describes nausea and diaphoresis but denies vomiting or radiation of the pain.  PCP Dr Herbert Seta [facility] Dr Tildon Husky  Past Medical History  Diagnosis Date  . CAD (coronary artery disease)   . Hypertension   . Orthostatic headache     Mild  . Bundle branch block, left     Chronic    Past Surgical History  Procedure Laterality Date  . Coronary artery bypass graft  2001    Family History  Problem Relation Age of Onset  . Heart failure Mother 60  . Heart failure Sister 76  . Heart attack Brother 42    History  Substance Use Topics  . Smoking status: Never Smoker   . Smokeless tobacco: Not on file  . Alcohol Use: No  lives alone  OB History   Grav Para Term Preterm Abortions TAB SAB Ect Mult Living                  Review of Systems  All other systems reviewed and are negative.    Allergies  Review of patient's allergies indicates no known allergies.  Home Medications   Current Outpatient Rx  Name  Route  Sig  Dispense  Refill  . amLODipine (NORVASC) 2.5 MG tablet   Oral   Take 2.5 mg by mouth daily.           [name] aspirin 81 MG tablet    Oral   Take 81 mg by mouth daily.           . B Complex Vitamins (B COMPLEX-B12) TABS   Oral   Take 1 tablet by mouth daily.           . calcium carbonate (TUMS - DOSED IN MG ELEMENTAL CALCIUM) 500 MG chewable tablet   Oral   Chew 1 tablet by mouth daily.         . Cholecalciferol (VITAMIN D3) 2000 UNITS TABS   Oral   Take 1 tablet by mouth daily.           . Garlic 1000 MG CAPS   Oral   Take 1 capsule by mouth.           . levothyroxine (SYNTHROID, LEVOTHROID) 100 MCG tablet   Oral   Take 100 mcg by mouth daily.           [name] losartan-hydrochlorothiazide (HYZAAR) 100-25 MG per tablet   Oral   Take 1 tablet by mouth daily.         [name] lovastatin (MEVACOR) 20 MG tablet   Oral   Take 1 tablet (20 mg total) by mouth  at bedtime.   30 tablet   11   . ranitidine (ZANTAC) 150 MG capsule   Oral   Take 150 mg by mouth daily.             BP 155/55  Pulse 76  Temp(Src) 98.3 F (36.8 C) (Oral)  Resp 16  SpO2 100%  Vital signs normal    Physical Exam  Nursing note and vitals reviewed. Constitutional: She is oriented to person, place, and time. She appears well-developed and well-nourished.  Non-toxic appearance. She does not appear ill. No distress.  Appears uncomfortable  HENT:  Head: Normocephalic and atraumatic.  Right Ear: External ear normal.  Left Ear: External ear normal.  Nose: Nose normal. No mucosal edema or rhinorrhea.  Mouth/Throat: Oropharynx is clear and moist and mucous membranes are normal. No dental abscesses or edematous.  Eyes: Conjunctivae and EOM are normal. Pupils are equal, round, and reactive to light.  Neck: Normal range of motion and full passive range of motion without pain. Neck supple.  Cardiovascular: Normal rate and regular rhythm.  Exam reveals no gallop and no friction rub.   Murmur heard. Faint systolic murmer present (states is old)  Pulmonary/Chest: Effort normal and breath sounds normal. No respiratory distress. She  has no wheezes. She has no rhonchi. She has no rales. She exhibits no tenderness and no crepitus.  Abdominal: Soft. Normal appearance and bowel sounds are normal. She exhibits no distension. There is no tenderness. There is no rebound and no guarding.  Musculoskeletal: Normal range of motion. She exhibits no edema and no tenderness.  Moves all extremities well.   Neurological: She is alert and oriented to person, place, and time. She has normal strength. No cranial nerve deficit.  Skin: Skin is warm, dry and intact. No rash noted. No erythema. No pallor.  Psychiatric: Her speech is normal and behavior is normal. Her mood appears not anxious.  Flat affect    ED Course  Procedures (including critical care time)  Medications  nitroGLYCERIN (NITROGLYN) 2 % ointment 1 inch (1 inch Topical Given 09/14/12 0252)   Recheck 03:30 states her chest pain is gone, has mild tightness now.   05:00 Dr Orvis Brill, [facility] admit to tele  Results for orders placed during the hospital encounter of 09/14/12  CBC WITH DIFFERENTIAL      Result Value Range   WBC 6.9  4.0 - 10.5 K/uL   RBC 3.77 (*) 3.87 - 5.11 MIL/uL   Hemoglobin 11.5 (*) 12.0 - 15.0 g/dL   HCT 16.1 (*) 09.6 - 04.5 %   MCV 89.7  78.0 - 100.0 fL   MCH 30.5  26.0 - 34.0 pg   MCHC 34.0  30.0 - 36.0 g/dL   RDW 40.9  81.1 - 91.4 %   Platelets 162  150 - 400 K/uL   Neutrophils Relative 69  43 - 77 %   Neutro Abs 4.8  1.7 - 7.7 K/uL   Lymphocytes Relative 18  12 - 46 %   Lymphs Abs 1.2  0.7 - 4.0 K/uL   Monocytes Relative 9  3 - 12 %   Monocytes Absolute 0.6  0.1 - 1.0 K/uL   Eosinophils Relative 4  0 - 5 %   Eosinophils Absolute 0.3  0.0 - 0.7 K/uL   Basophils Relative 0  0 - 1 %   Basophils Absolute 0.0  0.0 - 0.1 K/uL  COMPREHENSIVE METABOLIC PANEL      Result Value Range  Sodium 138  135 - 145 mEq/L   Potassium 4.3  3.5 - 5.1 mEq/L   Chloride 101  96 - 112 mEq/L   CO2 27  19 - 32 mEq/L   Glucose, Bld 152 (*) 70 - 99 mg/dL    BUN 34 (*) 6 - 23 mg/dL   Creatinine, Ser 1.61 (*) 0.50 - 1.10 mg/dL   Calcium 9.1  8.4 - 09.6 mg/dL   Total Protein 6.1  6.0 - 8.3 g/dL   Albumin 3.5  3.5 - 5.2 g/dL   AST 16  0 - 37 U/L   ALT 12  0 - 35 U/L   Alkaline Phosphatase 51  39 - 117 U/L   Total Bilirubin 0.2 (*) 0.3 - 1.2 mg/dL   GFR calc non Af Amer 32 (*) >90 mL/min   GFR calc Af Amer 37 (*) >90 mL/min  APTT      Result Value Range   aPTT 29  24 - 37 seconds  PROTIME-INR      Result Value Range   Prothrombin Time 12.6  11.6 - 15.2 seconds   INR 0.95  0.00 - 1.49  POCT I-STAT TROPONIN I      Result Value Range   Troponin i, poc 0.01  0.00 - 0.08 ng/mL   Comment 3            Vital signs normal except renal insuffic, hyperglycemia, mild anemia   Dg Chest Portable 1 View  09/14/2012  *RADIOLOGY REPORT*  Clinical Data: Chest pain  PORTABLE CHEST - 1 VIEW  Comparison: None.  Findings: Heart size upper normal. Hypoaeration with mild interstitial prominence.  Status post median sternotomy and CABG. Bibasilar areas of scarring or atelectasis.  Otherwise, no confluent airspace opacity.  No pleural effusion or pneumothorax. No acute osseous finding.  IMPRESSION: Heart size upper normal status post CABG.  Mild lung base atelectasis or scarring.  Mild interstitial prominence may be accentuated by hypoaeration. Mild interstitial edema not excluded.   Original Report Authenticated By: Jearld Lesch, M.D.      Date: 09/14/2012  Rate: 78  Rhythm: normal sinus rhythm and premature ventricular contractions (PVC)  QRS Axis: left  Intervals: normal  ST/T Wave abnormalities: nonspecific ST/T changes  Conduction Disutrbances: LBBB  Narrative Interpretation: Q waves anterior leads  Old EKG Reviewed: unchanged from 03/22/12    1. Chest pain     Plan admission  Devoria Albe, MD, FACEP   MDM          Ward Givens, MD 09/14/12 [phone]

## 2012-09-14 NOTE — ED Notes (Signed)
Lab in with pt, xray outside room.

## 2012-09-15 MED ORDER — LOVASTATIN 20 MG PO TABS: 20.0000 mg | ORAL_TABLET | Freq: Every day | ORAL | Status: DC

## 2012-09-15 NOTE — Progress Notes (Signed)
Patient ID: Kayla [name], female   DOB: 09-17-1926, 79 y.o.   MRN: [id number] Subjective:  Chest pain resolved.  Objective:  Vital Signs in the last 24 hours: Temp:  [97.9 F (36.6 C)-98.7 F (37.1 C)] 97.9 F (36.6 C) (03/02 0456) Pulse Rate:  [65-77] 65 (03/02 0456) Resp:  [16-20] 20 (03/02 0456) BP: (120-143)/(49-53) 143/50 mmHg (03/02 0456) SpO2:  [97 %-99 %] 97 % (03/02 0456) Weight:  [160 lb 12.8 oz (72.938 kg)] 160 lb 12.8 oz (72.938 kg) (03/02 0456)  Intake/Output from previous day: 03/01 0701 - 03/02 0700 In: 860 [P.O.:860] Out: 1100 [Urine:1100] Intake/Output from this shift:    Physical Exam: Well appearing NAD HEENT: Unremarkable Neck:  No JVD, no thyromegally Lungs:  Clear with no wheezes HEART:  Regular rate rhythm, no murmurs, no rubs, no clicks Abd:  soft, positive bowel sounds, no organomegally, no rebound, no guarding Ext:  2 plus pulses, no edema, no cyanosis, no clubbing Skin:  No rashes no nodules Neuro:  CN II through XII intact, motor grossly intact  Lab Results:  Recent Labs  09/14/12 0250  WBC 6.9  HGB 11.5*  PLT 162    Recent Labs  09/14/12 0250  NA 138  K 4.3  CL 101  CO2 27  GLUCOSE 152*  BUN 34*  CREATININE 1.48*    Recent Labs  09/14/12 1005 09/14/12 1537  TROPONINI <0.30 <0.30   Hepatic Function Panel  Recent Labs  09/14/12 0250  PROT 6.1  ALBUMIN 3.5  AST 16  ALT 12  ALKPHOS 51  BILITOT 0.2*   No results found for this basename: CHOL,  in the last 72 hours No results found for this basename: PROTIME,  in the last 72 hours  Imaging: Dg Chest Portable 1 View  09/14/2012  *RADIOLOGY REPORT*  Clinical Data: Chest pain  PORTABLE CHEST - 1 VIEW  Comparison: None.  Findings: Heart size upper normal. Hypoaeration with mild interstitial prominence.  Status post median sternotomy and CABG. Bibasilar areas of scarring or atelectasis.  Otherwise, no confluent airspace opacity.  No pleural effusion or pneumothorax. No  acute osseous finding.  IMPRESSION: Heart size upper normal status post CABG.  Mild lung base atelectasis or scarring.  Mild interstitial prominence may be accentuated by hypoaeration. Mild interstitial edema not excluded.   Original Report Authenticated By: Jearld Lesch, M.D.     Cardiac Studies: Tele - nsr Assessment/Plan:  1. Chest pain - her enzymes are negative and my suspicion is that she may have had angina. I would like an outpatient stress myoview. Ok for discharge as pain now resolved and cardiac enzymes are negative.  2. HTN - continue outpatient meds. 3. Dyslipidemia - continue statin, low fat diet.  LOS: 1 day    Gregg Taylor,M.D. 09/15/2012, 8:38 AM

## 2012-09-15 NOTE — Discharge Summary (Signed)
Physician Discharge Summary  Patient ID: Kayla [name] MRN: [id number] DOB/AGE: [birth year] 79 y.o.  Admit date: 09/14/2012 Discharge date: 09/15/2012  Primary Discharge Diagnosis: 1.Angina  Secondary Discharge Diagnosis: 1. CAD 2001 [employer] 2. Hypertension 3. Chronic LBBB 4. Hyperlipidemia  Significant Diagnostic Studies:None  Consults: None  Hospital Course:     Kayla [name] is a 79 y/o patient of Dr. Mariah Milling, seen in the [place] office with known history of CAD with CABG in 2001 at [facility]  (normal Myoview in 2009) who presented with chest discomfort. She described burning/tightness across her chest that awoke her from sleep last night. No radiation; there was associated dyspnea and nausea. It lasted hours. It improved with NTG. It did not feel like her pain that she had prior to her bypass. She was admitted to rule out cardiac etiology of chest pain for 24 hour observation. Cardiac enzymes were cycled and found to be negative, EKG did not reveal ACS or new changes, but this was difficult to discern due to chronic LBBB. Pain resolved overnight without need for additional NTG. She was seen and examined by Dr.Taylor on day of discharge and found to be stable to go home. No medication changes or cardiac testing was completed during admission, with recommendation for OP stress test through Dr. Windell Hummingbird office in [place].     Discharge Exam: Blood pressure 143/50, pulse 65, temperature 97.9 F (36.6 C), temperature source Oral, resp. rate 20, height 5\' 2"  (1.575 m), weight 160 lb 12.8 oz (72.938 kg), SpO2 97.00%. Labs:   Lab Results  Component Value Date   WBC 6.9 09/14/2012   HGB 11.5* 09/14/2012   HCT 33.8* 09/14/2012   MCV 89.7 09/14/2012   PLT 162 09/14/2012    Recent Labs Lab 09/14/12 0250  NA 138  K 4.3  CL 101  CO2 27  BUN 34*  CREATININE 1.48*  CALCIUM 9.1  PROT 6.1  BILITOT 0.2*  ALKPHOS 51  ALT 12  AST 16  GLUCOSE 152*   Lab Results  Component Value Date   TROPONINI <0.30 09/14/2012        Radiology: Dg Chest Portable 1 View  09/14/2012  *RADIOLOGY REPORT*  Clinical Data: Chest pain  PORTABLE CHEST - 1 VIEW  Comparison: None.  Findings: Heart size upper normal. Hypoaeration with mild interstitial prominence.  Status post median sternotomy and CABG. Bibasilar areas of scarring or atelectasis.  Otherwise, no confluent airspace opacity.  No pleural effusion or pneumothorax. No acute osseous finding.  IMPRESSION: Heart size upper normal status post CABG.  Mild lung base atelectasis or scarring.  Mild interstitial prominence may be accentuated by hypoaeration. Mild interstitial edema not excluded.   Original Report Authenticated By: Jearld Lesch, M.D.     EKG: Sinus rhythm with occasional Premature ventricular complexes             LBBB rate of 68 bpm.  FOLLOW UP PLANS AND APPOINTMENTS Discharge Orders   Future Appointments Provider Department Dept Phone   09/27/2012 10:15 AM Antonieta Iba, MD [facility] [phone]   Future Orders Complete By Expires     Diet - low sodium heart healthy  As directed     Increase activity slowly  As directed         Medication List    TAKE these medications       amLODipine 2.5 MG tablet  Commonly known as:  NORVASC  Take 2.5 mg by mouth daily.     aspirin  81 MG tablet  Take 81 mg by mouth daily.     B Complex-B12 Tabs  Take 1 tablet by mouth daily.     calcium carbonate 500 MG chewable tablet  Commonly known as:  TUMS - dosed in mg elemental calcium  Chew 1 tablet by mouth daily.     Garlic 1000 MG Caps  Take 1 capsule by mouth.     levothyroxine 100 MCG tablet  Commonly known as:  SYNTHROID, LEVOTHROID  Take 100 mcg by mouth daily.     losartan-hydrochlorothiazide 100-25 MG per tablet  Commonly known as:  HYZAAR  Take 1 tablet by mouth daily.     lovastatin 20 MG tablet  Commonly known as:  MEVACOR  Take 1 tablet (20 mg total) by mouth at bedtime.      ranitidine 150 MG capsule  Commonly known as:  ZANTAC  Take 150 mg by mouth daily.     Vitamin D3 2000 UNITS Tabs  Take 1 tablet by mouth daily.           Follow-up Information   Follow up with Julien Nordmann, MD. (Our office will call you for appointment)    Contact information:   [address] [place] [address] [phone]         Time spent with patient to include physician time:35 mintues Signed: Joni Reining 09/15/2012, 9:56 AM Co-Sign MD

## 2012-09-16 NOTE — Progress Notes (Signed)
Utilization Review Completed.   Kimberly Tucker, RN, BSN Nurse Case Manager  [phone]  

## 2012-09-27 ENCOUNTER — Encounter: Payer: Self-pay | Admitting: Cardiovascular Disease

## 2012-09-27 ENCOUNTER — Ambulatory Visit (INDEPENDENT_AMBULATORY_CARE_PROVIDER_SITE_OTHER): Payer: Medicare Other | Admitting: Cardiovascular Disease

## 2012-09-27 VITALS — BP 162/78 | HR 58 | Ht 62.0 in | Wt 163.0 lb

## 2012-09-27 DIAGNOSIS — R12 Heartburn: Secondary | ICD-10-CM

## 2012-09-27 DIAGNOSIS — I447 Left bundle-branch block, unspecified: Secondary | ICD-10-CM

## 2012-09-27 DIAGNOSIS — R0602 Shortness of breath: Secondary | ICD-10-CM

## 2012-09-27 DIAGNOSIS — I25810 Atherosclerosis of coronary artery bypass graft(s) without angina pectoris: Secondary | ICD-10-CM

## 2012-09-27 DIAGNOSIS — R011 Cardiac murmur, unspecified: Secondary | ICD-10-CM

## 2012-09-27 MED ORDER — OMEPRAZOLE 20 MG PO CPDR: 20.0000 mg | DELAYED_RELEASE_CAPSULE | Freq: Two times a day (BID) | ORAL | Status: DC

## 2012-09-27 NOTE — Assessment & Plan Note (Signed)
Suggested she continue her statin 

## 2012-09-27 NOTE — Patient Instructions (Addendum)
Please start omeprazole twice a day for GERD Continue zantac twice a day for one more week, and then ok to take as needed   We will schedule you for an echocardiogram for murmur, shortness of breath For add  Please call us if you have new issues that need to be addressed before your next appt.  Your physician wants you to follow-up in: 3 months.

## 2012-09-27 NOTE — Assessment & Plan Note (Signed)
Echocardiogram scheduled for murmur also given symptoms of shortness of breath and recent chest pain

## 2012-09-27 NOTE — Progress Notes (Signed)
Patient ID: Kayla [name], female    DOB: Dec 29, 1926, 79 y.o.   MRN: [id number]  HPI Comments: Ms. Speelman is an 79 y/o woman with h/o CAD s/p CABG in 2001 at [facility], LBBB, hyperlipidemia, GERD and HTN.  Myoview in 2009 was normal.  Here for routine f/u.     Recent admission to the hospital for chest discomfort. Rule out and was sent home She reports having significant cough, GERD symptoms, indigestion and congestion in her chest. Worse with laying supine, worse after meals, some belching relieves the pressure. Never had workup by GI in the past. Recently increased Zantac to twice a day. Has not tried proton pump inhibitors.Denies any chest pain that she feels is concerning for coronary disease. She feels most of her symptoms are from GI and indigestion. She was told to have a stress test as an outpatient  EKG shows NSR with LBBB, rate 58 bpm  Previous  CHol 177, LDL 104, HDL 53      Outpatient Encounter Prescriptions as of 09/27/2012  Medication Sig Dispense Refill  . amLODipine (NORVASC) 2.5 MG tablet Take 2.5 mg by mouth daily.       [name] aspirin 81 MG tablet Take 81 mg by mouth daily.        . B Complex Vitamins (B COMPLEX-B12) TABS Take 1 tablet by mouth daily.        . calcium carbonate (TUMS - DOSED IN MG ELEMENTAL CALCIUM) 500 MG chewable tablet Chew 1 tablet by mouth daily.      . Cholecalciferol (VITAMIN D3) 2000 UNITS TABS Take 1 tablet by mouth daily.        [name] Dextromethorphan-Guaifenesin (CORICIDIN HBP CONGESTION/COUGH) 10-200 MG CAPS Take by mouth as needed.      [name] levothyroxine (SYNTHROID, LEVOTHROID) 100 MCG tablet Take 100 mcg by mouth daily.        [name] losartan-hydrochlorothiazide (HYZAAR) 100-25 MG per tablet Take 1 tablet by mouth daily.      [name] lovastatin (MEVACOR) 20 MG tablet Take 1 tablet (20 mg total) by mouth at bedtime.  30 tablet  11  . ranitidine (ZANTAC) 150 MG capsule Take 150 mg by mouth daily.        . [DISCONTINUED] Garlic 1000 MG CAPS Take 1 capsule by mouth.          No facility-administered encounter medications on file as of 09/27/2012.     Review of Systems  Constitutional: Negative.   HENT: Negative.   Eyes: Negative.   Respiratory: Positive for chest tightness.   Cardiovascular: Negative.   Gastrointestinal: Negative.        GERD symptoms  Musculoskeletal: Negative.   Skin: Negative.   Neurological: Positive for dizziness.  Psychiatric/Behavioral: Negative.   All other systems reviewed and are negative.    BP 162/78  Pulse 58  Ht 5\' 2"  (1.575 m)  Wt 163 lb (73.936 kg)  BMI 29.81 kg/m2  Physical Exam  Nursing note and vitals reviewed. Constitutional: She is oriented to person, place, and time. She appears well-developed and well-nourished.  HENT:  Head: Normocephalic.  Nose: Nose normal.  Mouth/Throat: Oropharynx is clear and moist.  Eyes: Conjunctivae are normal. Pupils are equal, round, and reactive to light.  Neck: Normal range of motion. Neck supple. No JVD present.  Cardiovascular: Normal rate, regular rhythm, S1 normal, S2 normal and intact distal pulses.  Exam reveals no gallop and no friction rub.   Murmur heard.  Crescendo systolic murmur is present with a  grade of 2/6  Pulmonary/Chest: Effort normal and breath sounds normal. No respiratory distress. She has no wheezes. She has no rales. She exhibits no tenderness.  Abdominal: Soft. Bowel sounds are normal. She exhibits no distension. There is no tenderness.  Musculoskeletal: Normal range of motion. She exhibits no edema and no tenderness.  Lymphadenopathy:    She has no cervical adenopathy.  Neurological: She is alert and oriented to person, place, and time. Coordination normal.  Skin: Skin is warm and dry. No rash noted. No erythema.  Psychiatric: She has a normal mood and affect. Her behavior is normal. Judgment and thought content normal.    Assessment and Plan

## 2012-09-27 NOTE — Assessment & Plan Note (Signed)
Suspect she is having significant GERD symptoms. She does have a strong family history of GERD. We have recommended she start on omeprazole 20 g twice a day.

## 2012-09-27 NOTE — Assessment & Plan Note (Signed)
If no improvement in her chest congestion and GERD symptoms on omeprazole, we have suggested she consider a stress test. She will call us at symptoms do not improve.

## 2012-10-02 ENCOUNTER — Telehealth: Payer: Self-pay

## 2012-10-02 NOTE — Telephone Encounter (Signed)
lmtcb

## 2012-10-02 NOTE — Telephone Encounter (Signed)
Please call pt daughter Eunice Blase, she has question about her mom having a stress test. She states she is not sure if her mom is up to having the stress test, and would like to talk with a nurse. Please advise

## 2012-10-03 NOTE — Telephone Encounter (Signed)
lmtcb

## 2012-10-04 ENCOUNTER — Telehealth: Payer: Self-pay

## 2012-10-04 NOTE — Telephone Encounter (Signed)
Pts dtr called says pt is ready to schedule stress tests (as discussed at last OV with Dr. Mariah Milling).  I was able to speak with Dr. Mariah Milling who says ok to order lexiscan.   Dtr asks if this can be done same day as echo 3/25 I told her I would call to see if this is available and call her back at [phone].

## 2012-10-04 NOTE — Telephone Encounter (Signed)
I called dtr to give her instructions for lexi scheduled for 10/08/12 dtr wishes to cancel lexi and "just go with echo for now" Hospital made aware

## 2012-10-08 ENCOUNTER — Other Ambulatory Visit: Payer: Self-pay

## 2012-10-08 ENCOUNTER — Other Ambulatory Visit (INDEPENDENT_AMBULATORY_CARE_PROVIDER_SITE_OTHER): Payer: Medicare Other

## 2012-10-08 DIAGNOSIS — I2510 Atherosclerotic heart disease of native coronary artery without angina pectoris: Secondary | ICD-10-CM

## 2012-10-08 DIAGNOSIS — R0602 Shortness of breath: Secondary | ICD-10-CM

## 2012-10-08 DIAGNOSIS — R011 Cardiac murmur, unspecified: Secondary | ICD-10-CM

## 2012-10-08 DIAGNOSIS — I25810 Atherosclerosis of coronary artery bypass graft(s) without angina pectoris: Secondary | ICD-10-CM

## 2012-10-08 DIAGNOSIS — I447 Left bundle-branch block, unspecified: Secondary | ICD-10-CM

## 2012-10-11 ENCOUNTER — Telehealth: Payer: Self-pay

## 2012-10-11 NOTE — Telephone Encounter (Signed)
error 

## 2012-10-11 NOTE — Telephone Encounter (Signed)
Pt daughter called and left new # to contact with results

## 2012-10-11 NOTE — Telephone Encounter (Signed)
Aortic valve and tricuspid valve have mild to moderate regurgitation Ejection fraction is mildly depressed at 45-50%, likely from conduction abnormality and old bypass surgery Right heart pressures are high normal  Overall, not a bad study If she has worsening chest pain, would proceed with stress test

## 2012-10-11 NOTE — Telephone Encounter (Signed)
Please review echo results

## 2012-10-14 NOTE — Telephone Encounter (Signed)
dtr informed Understanding verb She says pt continues to have coughing spells that occur after meals She asks if she should f/u with PCP about this I advised either PCP or GI She will have pt do either

## 2012-11-20 ENCOUNTER — Telehealth: Payer: Self-pay

## 2012-11-20 NOTE — Telephone Encounter (Signed)
Ok to have EGD with propofol? Scheduled for 5/14

## 2012-11-21 NOTE — Telephone Encounter (Signed)
Call received from April from [facility]. She is needing to know today if the patient can hold aspirin starting tomorrow. I spoke with Dr. Mariah Milling, he reports he is usually not asked for a patient to hold aspirin for an EGD. He would prefer to continue, but if it is really necessary to stop aspirin, the patient may hold for 5 days prior, but she will be at increased cardiovascular risk. I have called April back and made her aware that Dr. Mariah Milling prefers the patient not hold aspirin prior to EGD. She states Dr. Bluford Kaufmann will do on aspirin, he just prefers to hold in case a biopsy is required to decrease bleeding. They will proceed on aspirin. I asked if they need a written clearance for the patient to have this done, per April, they do not.

## 2012-11-27 ENCOUNTER — Ambulatory Visit: Payer: Self-pay | Admitting: Gastroenterology

## 2012-11-29 LAB — PATHOLOGY REPORT

## 2013-01-08 ENCOUNTER — Ambulatory Visit (INDEPENDENT_AMBULATORY_CARE_PROVIDER_SITE_OTHER): Payer: Medicare Other | Admitting: Cardiovascular Disease

## 2013-01-08 ENCOUNTER — Encounter: Payer: Self-pay | Admitting: Cardiovascular Disease

## 2013-01-08 VITALS — BP 159/74 | HR 69 | Ht 62.0 in | Wt 164.0 lb

## 2013-01-08 DIAGNOSIS — I25810 Atherosclerosis of coronary artery bypass graft(s) without angina pectoris: Secondary | ICD-10-CM

## 2013-01-08 DIAGNOSIS — E785 Hyperlipidemia, unspecified: Secondary | ICD-10-CM

## 2013-01-08 DIAGNOSIS — I10 Essential (primary) hypertension: Secondary | ICD-10-CM

## 2013-01-08 NOTE — Patient Instructions (Addendum)
You are doing well. No medication changes were made.  Please call us if you have new issues that need to be addressed before your next appt.  Your physician wants you to follow-up in: 6 months.  You will receive a reminder letter in the mail two months in advance. If you don't receive a letter, please call our office to schedule the follow-up appointment.   

## 2013-01-08 NOTE — Assessment & Plan Note (Signed)
Currently with no symptoms of angina. No further workup at this time. Continue current medication regimen. 

## 2013-01-08 NOTE — Progress Notes (Signed)
Patient ID: Kayla [name], female    DOB: 1927/02/18, 79 y.o.   MRN: [id number]  HPI Comments: Kayla [name] is an 79 y/o woman with h/o CAD s/p CABG in 2001 at [facility], LBBB, hyperlipidemia, GERD and HTN.  Myoview in 2009 was normal.  Here for routine f/u.     Previous admission to the hospital for chest discomfort. Rule out and was sent home GI/Gerd sx have been better with a PPI.  Overall she feels well with no complaints. She does have baseline mild chronic shortness of breath, worse with stairs and hills, and when she first gets up. This has not changed over the past several years. No significant chest pain or concern for angina. Blood pressure is well controlled at home. Typically in 130 systolic range  EKG shows NSR with LBBB, no change from prior EKG  Previous  CHol 177, LDL 104, HDL 53      Outpatient Encounter Prescriptions as of 01/08/2013  Medication Sig Dispense Refill  . amLODipine (NORVASC) 5 MG tablet Take 5 mg by mouth daily.       [name] aspirin 81 MG tablet Take 81 mg by mouth daily.        . B Complex Vitamins (B COMPLEX-B12) TABS Take 1 tablet by mouth daily.        . Cyanocobalamin (VITAMIN B 12 PO) Take 3,000 Units by mouth daily.      [name] levothyroxine (SYNTHROID, LEVOTHROID) 100 MCG tablet Take 100 mcg by mouth daily.        [name] losartan-hydrochlorothiazide (HYZAAR) 100-25 MG per tablet Take 1 tablet by mouth daily.      [name] lovastatin (MEVACOR) 20 MG tablet Take 1 tablet (20 mg total) by mouth at bedtime.  30 tablet  11  . omeprazole (PRILOSEC) 20 MG capsule Take 1 capsule (20 mg total) by mouth 2 (two) times daily.  60 capsule  11  . ranitidine (ZANTAC) 150 MG capsule Take 150 mg by mouth daily.        [name] VITAMIN D, CHOLECALCIFEROL, PO Take 5,000 Units by mouth daily.        Review of Systems  Constitutional: Negative.   HENT: Negative.   Eyes: Negative.   Respiratory: Positive for shortness of breath.   Cardiovascular: Negative.   Gastrointestinal: Negative.    GERD symptoms  Musculoskeletal: Negative.   Skin: Negative.   Psychiatric/Behavioral: Negative.   All other systems reviewed and are negative.    BP 159/74  Pulse 69  Ht 5\' 2"  (1.575 m)  Wt 164 lb (74.39 kg)  BMI 29.99 kg/m2  Physical Exam  Nursing note and vitals reviewed. Constitutional: She is oriented to person, place, and time. She appears well-developed and well-nourished.  HENT:  Head: Normocephalic.  Nose: Nose normal.  Mouth/Throat: Oropharynx is clear and moist.  Eyes: Conjunctivae are normal. Pupils are equal, round, and reactive to light.  Neck: Normal range of motion. Neck supple. No JVD present.  Cardiovascular: Normal rate, regular rhythm, S1 normal, S2 normal and intact distal pulses.  Exam reveals no gallop and no friction rub.   Murmur heard.  Crescendo systolic murmur is present with a grade of 2/6  Pulmonary/Chest: Effort normal and breath sounds normal. No respiratory distress. She has no wheezes. She has no rales. She exhibits no tenderness.  Abdominal: Soft. Bowel sounds are normal. She exhibits no distension. There is no tenderness.  Musculoskeletal: Normal range of motion. She exhibits no edema and no tenderness.  Lymphadenopathy:  She has no cervical adenopathy.  Neurological: She is alert and oriented to person, place, and time. Coordination normal.  Skin: Skin is warm and dry. No rash noted. No erythema.  Psychiatric: She has a normal mood and affect. Her behavior is normal. Judgment and thought content normal.    Assessment and Plan

## 2013-01-09 DIAGNOSIS — I10 Essential (primary) hypertension: Secondary | ICD-10-CM | POA: Insufficient documentation

## 2013-01-09 NOTE — Assessment & Plan Note (Signed)
Encouraged her to continue to monitor her blood pressure at home.

## 2013-01-09 NOTE — Assessment & Plan Note (Signed)
We'll try to obtain her most recent lipid panel for our records

## 2013-06-26 ENCOUNTER — Ambulatory Visit: Payer: Self-pay

## 2013-07-15 ENCOUNTER — Ambulatory Visit: Payer: Medicare Other | Admitting: Cardiovascular Disease

## 2013-07-24 ENCOUNTER — Ambulatory Visit: Payer: Medicare Other | Admitting: Cardiovascular Disease

## 2013-07-28 ENCOUNTER — Ambulatory Visit (INDEPENDENT_AMBULATORY_CARE_PROVIDER_SITE_OTHER): Payer: Medicare Other | Admitting: Cardiovascular Disease

## 2013-07-28 ENCOUNTER — Encounter: Payer: Self-pay | Admitting: Cardiovascular Disease

## 2013-07-28 VITALS — BP 120/62 | HR 68 | Ht 62.0 in | Wt 161.2 lb

## 2013-07-28 DIAGNOSIS — I447 Left bundle-branch block, unspecified: Secondary | ICD-10-CM

## 2013-07-28 DIAGNOSIS — R011 Cardiac murmur, unspecified: Secondary | ICD-10-CM

## 2013-07-28 DIAGNOSIS — E785 Hyperlipidemia, unspecified: Secondary | ICD-10-CM

## 2013-07-28 DIAGNOSIS — I10 Essential (primary) hypertension: Secondary | ICD-10-CM

## 2013-07-28 DIAGNOSIS — I25810 Atherosclerosis of coronary artery bypass graft(s) without angina pectoris: Secondary | ICD-10-CM

## 2013-07-28 MED ORDER — LEVOTHYROXINE SODIUM 100 MCG PO TABS: 100.0000 ug | ORAL_TABLET | Freq: Every day | ORAL | Status: DC

## 2013-07-28 NOTE — Progress Notes (Signed)
Patient ID: Kayla [name], female    DOB: 02/03/1927, 80 y.o.   MRN: [id number]  HPI Comments: Kayla [name] is an 80 y/o woman with h/o CAD s/p CABG in 2001 at [facility], LBBB, hyperlipidemia, GERD and HTN.  Myoview in 2009 was normal.  Here for routine f/u.     Previous admission to the hospital for chest discomfort. Rule out and was sent home Previous GERD symptoms, takes proton pump inhibitor On today's visit has no complaints. His walking with a girlfriend on a frequent basis with no symptoms of chest pain, shortness of breath. Blood pressure is well controlled at home. Typically in 130 systolic range  EKG shows NSR with LBBB, no change from prior EKG  Lab work from July 2014  CHol 121, LDL 54, HDL 53      Outpatient Encounter Prescriptions as of 07/28/2013  Medication Sig  . amLODipine (NORVASC) 5 MG tablet Take 5 mg by mouth daily.   [name]. aspirin 81 MG tablet Take 81 mg by mouth daily.    . B Complex Vitamins (B COMPLEX-B12) TABS Take 1 tablet by mouth daily.    . Cyanocobalamin (VITAMIN B 12 PO) Take 3,000 Units by mouth daily.  [name]. levothyroxine (SYNTHROID, LEVOTHROID) 100 MCG tablet Take 100 mcg by mouth daily.    [name]. losartan-hydrochlorothiazide (HYZAAR) 100-25 MG per tablet Take 1 tablet by mouth daily.  [name]. lovastatin (MEVACOR) 20 MG tablet Take 1 tablet (20 mg total) by mouth at bedtime.  . meclizine (ANTIVERT) 12.5 MG tablet Take 12.5 mg by mouth 3 (three) times daily as needed.   [name]. omeprazole (PRILOSEC) 20 MG capsule Take 1 capsule (20 mg total) by mouth 2 (two) times daily.  . ranitidine (ZANTAC) 150 MG capsule Take 150 mg by mouth daily.    [name]. VITAMIN D, CHOLECALCIFEROL, PO Take 5,000 Units by mouth daily.     Review of Systems  Constitutional: Negative.   HENT: Negative.   Eyes: Negative.   Cardiovascular: Negative.   Gastrointestinal: Negative.        GERD symptoms  Endocrine: Negative.   Musculoskeletal: Negative.   Skin: Negative.   Allergic/Immunologic: Negative.    Neurological: Negative.   Hematological: Negative.   Psychiatric/Behavioral: Negative.   All other systems reviewed and are negative.    BP 120/62  Pulse 68  Ht 5\' 2"  (1.575 m)  Wt 161 lb 4 oz (73.143 kg)  BMI 29.49 kg/m2  Physical Exam  Nursing note and vitals reviewed. Constitutional: She is oriented to person, place, and time. She appears well-developed and well-nourished.  HENT:  Head: Normocephalic.  Nose: Nose normal.  Mouth/Throat: Oropharynx is clear and moist.  Eyes: Conjunctivae are normal. Pupils are equal, round, and reactive to light.  Neck: Normal range of motion. Neck supple. No JVD present.  Cardiovascular: Normal rate, regular rhythm, S1 normal, S2 normal and intact distal pulses.  Exam reveals no gallop and no friction rub.   Murmur heard.  Crescendo systolic murmur is present with a grade of 2/6  Pulmonary/Chest: Effort normal and breath sounds normal. No respiratory distress. She has no wheezes. She has no rales. She exhibits no tenderness.  Abdominal: Soft. Bowel sounds are normal. She exhibits no distension. There is no tenderness.  Musculoskeletal: Normal range of motion. She exhibits no edema and no tenderness.  Lymphadenopathy:    She has no cervical adenopathy.  Neurological: She is alert and oriented to person, place, and time. Coordination normal.  Skin: Skin is warm and  dry. No rash noted. No erythema.  Psychiatric: She has a normal mood and affect. Her behavior is normal. Judgment and thought content normal.    Assessment and Plan

## 2013-07-28 NOTE — Assessment & Plan Note (Signed)
Blood pressure is well controlled on today's visit. No changes made to the medications. 

## 2013-07-28 NOTE — Assessment & Plan Note (Signed)
Cholesterol is at goal on the current lipid regimen. No changes to the medications were made.  

## 2013-07-28 NOTE — Patient Instructions (Signed)
You are doing well. No medication changes were made.  Please call us if you have new issues that need to be addressed before your next appt.  Your physician wants you to follow-up in: 6 months.  You will receive a reminder letter in the mail two months in advance. If you don't receive a letter, please call our office to schedule the follow-up appointment.   

## 2013-07-28 NOTE — Assessment & Plan Note (Signed)
Currently with no symptoms of angina. No further workup at this time. Continue current medication regimen. 

## 2013-07-28 NOTE — Assessment & Plan Note (Signed)
Low-grade murmur consistent with aortic valve sclerosis, possible mild stenosis. Asymptomatic

## 2014-01-27 ENCOUNTER — Ambulatory Visit: Payer: Medicare Other | Admitting: Cardiovascular Disease

## 2014-03-30 ENCOUNTER — Ambulatory Visit (INDEPENDENT_AMBULATORY_CARE_PROVIDER_SITE_OTHER): Payer: Medicare Other | Admitting: Cardiovascular Disease

## 2014-03-30 ENCOUNTER — Encounter: Payer: Self-pay | Admitting: Cardiovascular Disease

## 2014-03-30 ENCOUNTER — Ambulatory Visit: Payer: Medicare Other | Admitting: Cardiovascular Disease

## 2014-03-30 VITALS — BP 158/78 | HR 62 | Ht 61.0 in | Wt 158.8 lb

## 2014-03-30 DIAGNOSIS — R0602 Shortness of breath: Secondary | ICD-10-CM

## 2014-03-30 DIAGNOSIS — I499 Cardiac arrhythmia, unspecified: Secondary | ICD-10-CM

## 2014-03-30 DIAGNOSIS — I447 Left bundle-branch block, unspecified: Secondary | ICD-10-CM

## 2014-03-30 DIAGNOSIS — I25810 Atherosclerosis of coronary artery bypass graft(s) without angina pectoris: Secondary | ICD-10-CM

## 2014-03-30 DIAGNOSIS — E785 Hyperlipidemia, unspecified: Secondary | ICD-10-CM

## 2014-03-30 DIAGNOSIS — I10 Essential (primary) hypertension: Secondary | ICD-10-CM

## 2014-03-30 DIAGNOSIS — I491 Atrial premature depolarization: Secondary | ICD-10-CM

## 2014-03-30 DIAGNOSIS — R011 Cardiac murmur, unspecified: Secondary | ICD-10-CM

## 2014-03-30 NOTE — Assessment & Plan Note (Signed)
Rare palpitations heard on clinical exam today with normal sinus rhythm. Suspect here irregular rhythm her 1 month ago is secondary to ectopy. Suggested she closely monitor her heart rate at home using a pulsed meter on her phone. If clinically abnormal, Holter monitor could be ordered

## 2014-03-30 NOTE — Assessment & Plan Note (Signed)
Blood pressure elevated today. She did not take her morning medications. Recommended that she continue to closely monitor her blood pressure at home

## 2014-03-30 NOTE — Assessment & Plan Note (Signed)
Mild to moderate aortic valve insufficiency and tricuspid valve regurgitation on prior echocardiogram in 2014

## 2014-03-30 NOTE — Assessment & Plan Note (Signed)
Cholesterol is at goal on the current lipid regimen. No changes to the medications were made.  

## 2014-03-30 NOTE — Assessment & Plan Note (Signed)
Currently with no symptoms of angina. No further workup at this time. Continue current medication regimen. 

## 2014-03-30 NOTE — Patient Instructions (Addendum)
You are doing well. No medication changes were made.  Please monitor your blood pressure at home Goal blood pressure <140, <90 on the bottom  Please monitor your rhythm for extra beats  Please call us if you have new issues that need to be addressed before your next appt.  Your physician wants you to follow-up in: 6 months.  You will receive a reminder letter in the mail two months in advance. If you don't receive a letter, please call our office to schedule the follow-up appointment.

## 2014-03-30 NOTE — Progress Notes (Signed)
Patient ID: Kayla [name], female    DOB: [birth year], 80 y.o.   MRN: [id number]  HPI Comments: Kayla [name] is an 80 y/o woman with h/o CAD s/p CABG in 2001 at [facility], LBBB, hyperlipidemia, GERD and HTN.  Myoview in 2009 was normal.  Here for routine f/u.    In followup today, she reports that she is doing well. She is participating in cardiac rehabilitation She was told one month ago that she had an irregular rhythm. She was asymptomatic. No further irregular rhythm finding since that time Denies any lower extremity edema In general has no complaints. Blood pressure at home typically in the 130 range   Previous admission to the hospital for chest discomfort. Rule out and was sent home Previous GERD symptoms, takes proton pump inhibitor  EKG shows NSR with rate 62 beats per minute, LBBB, no change from prior EKG  Lab work from July 2014  CHol 121, LDL 54, HDL 53      Outpatient Encounter Prescriptions as of 03/30/2014  Medication Sig  . amLODipine (NORVASC) 5 MG tablet Take 5 mg by mouth daily.   [name] aspirin 81 MG tablet Take 81 mg by mouth daily.    . B Complex Vitamins (B COMPLEX-B12) TABS Take 1 tablet by mouth daily.    . Cyanocobalamin (VITAMIN B 12 PO) Take 3,000 Units by mouth daily.  [name] levothyroxine (SYNTHROID, LEVOTHROID) 100 MCG tablet Take 1 tablet (100 mcg total) by mouth daily.  [name] losartan-hydrochlorothiazide (HYZAAR) 100-25 MG per tablet Take 1 tablet by mouth daily.  [name] lovastatin (MEVACOR) 20 MG tablet Take 1 tablet (20 mg total) by mouth at bedtime.  . meclizine (ANTIVERT) 12.5 MG tablet Take 12.5 mg by mouth 3 (three) times daily as needed.   [name] omeprazole (PRILOSEC) 20 MG capsule Take 1 capsule (20 mg total) by mouth 2 (two) times daily.  . ranitidine (ZANTAC) 150 MG capsule Take 150 mg by mouth daily.    [name] VITAMIN D, CHOLECALCIFEROL, PO Take 5,000 Units by mouth daily.    Review of Systems  Constitutional: Negative.   HENT: Negative.   Eyes: Negative.    Respiratory: Negative.   Cardiovascular: Negative.   Gastrointestinal: Negative.        GERD symptoms  Endocrine: Negative.   Musculoskeletal: Negative.   Skin: Negative.   Allergic/Immunologic: Negative.   Neurological: Negative.   Hematological: Negative.   Psychiatric/Behavioral: Negative.   All other systems reviewed and are negative.   BP 158/78  Pulse 62  Ht  (1.549 m)  Wt 158 lb 12 oz (72.009 kg)  BMI 30.01 kg/m2  Physical Exam  Nursing note and vitals reviewed. Constitutional: She is oriented to person, place, and time. She appears well-developed and well-nourished.  HENT:  Head: Normocephalic.  Nose: Nose normal.  Mouth/Throat: Oropharynx is clear and moist.  Eyes: Conjunctivae are normal. Pupils are equal, round, and reactive to light.  Neck: Normal range of motion. Neck supple. No JVD present.  Cardiovascular: Normal rate, regular rhythm, S1 normal, S2 normal and intact distal pulses.  Exam reveals no gallop and no friction rub.   Murmur heard.  Crescendo systolic murmur is present with a grade of 2/6  Pulmonary/Chest: Effort normal and breath sounds normal. No respiratory distress. She has no wheezes. She has no rales. She exhibits no tenderness.  Abdominal: Soft. Bowel sounds are normal. She exhibits no distension. There is no tenderness.  Musculoskeletal: Normal range of motion. She exhibits no edema and  no tenderness.  Lymphadenopathy:    She has no cervical adenopathy.  Neurological: She is alert and oriented to person, place, and time. Coordination normal.  Skin: Skin is warm and dry. No rash noted. No erythema.  Psychiatric: She has a normal mood and affect. Her behavior is normal. Judgment and thought content normal.    Assessment and Plan

## 2014-06-29 ENCOUNTER — Ambulatory Visit: Payer: Self-pay

## 2014-08-21 DIAGNOSIS — H2512 Age-related nuclear cataract, left eye: Secondary | ICD-10-CM | POA: Diagnosis not present

## 2014-08-28 DIAGNOSIS — R300 Dysuria: Secondary | ICD-10-CM | POA: Diagnosis not present

## 2014-08-28 DIAGNOSIS — E559 Vitamin D deficiency, unspecified: Secondary | ICD-10-CM | POA: Diagnosis not present

## 2014-08-28 DIAGNOSIS — Z0001 Encounter for general adult medical examination with abnormal findings: Secondary | ICD-10-CM | POA: Diagnosis not present

## 2014-08-28 DIAGNOSIS — I10 Essential (primary) hypertension: Secondary | ICD-10-CM | POA: Diagnosis not present

## 2014-08-28 DIAGNOSIS — E782 Mixed hyperlipidemia: Secondary | ICD-10-CM | POA: Diagnosis not present

## 2014-08-28 DIAGNOSIS — E039 Hypothyroidism, unspecified: Secondary | ICD-10-CM | POA: Diagnosis not present

## 2014-09-14 DIAGNOSIS — N182 Chronic kidney disease, stage 2 (mild): Secondary | ICD-10-CM | POA: Diagnosis not present

## 2014-09-14 DIAGNOSIS — I10 Essential (primary) hypertension: Secondary | ICD-10-CM | POA: Diagnosis not present

## 2014-09-14 DIAGNOSIS — M810 Age-related osteoporosis without current pathological fracture: Secondary | ICD-10-CM | POA: Diagnosis not present

## 2014-09-30 ENCOUNTER — Ambulatory Visit (INDEPENDENT_AMBULATORY_CARE_PROVIDER_SITE_OTHER): Payer: Medicare Other | Admitting: Cardiovascular Disease

## 2014-09-30 ENCOUNTER — Encounter: Payer: Self-pay | Admitting: Cardiovascular Disease

## 2014-09-30 VITALS — BP 130/54 | HR 72 | Ht 62.0 in | Wt 161.8 lb

## 2014-09-30 DIAGNOSIS — E039 Hypothyroidism, unspecified: Secondary | ICD-10-CM | POA: Diagnosis not present

## 2014-09-30 DIAGNOSIS — I10 Essential (primary) hypertension: Secondary | ICD-10-CM | POA: Diagnosis not present

## 2014-09-30 DIAGNOSIS — R944 Abnormal results of kidney function studies: Secondary | ICD-10-CM | POA: Diagnosis not present

## 2014-09-30 DIAGNOSIS — E785 Hyperlipidemia, unspecified: Secondary | ICD-10-CM

## 2014-09-30 DIAGNOSIS — R011 Cardiac murmur, unspecified: Secondary | ICD-10-CM

## 2014-09-30 DIAGNOSIS — R010 Benign and innocent cardiac murmurs: Secondary | ICD-10-CM

## 2014-09-30 DIAGNOSIS — E875 Hyperkalemia: Secondary | ICD-10-CM | POA: Diagnosis not present

## 2014-09-30 DIAGNOSIS — I25718 Atherosclerosis of autologous vein coronary artery bypass graft(s) with other forms of angina pectoris: Secondary | ICD-10-CM | POA: Diagnosis not present

## 2014-09-30 DIAGNOSIS — I447 Left bundle-branch block, unspecified: Secondary | ICD-10-CM | POA: Diagnosis not present

## 2014-09-30 NOTE — Patient Instructions (Signed)
You are doing well. No medication changes were made.  Please call us if you have new issues that need to be addressed before your next appt.  Your physician wants you to follow-up in: 6 months.  You will receive a reminder letter in the mail two months in advance. If you don't receive a letter, please call our office to schedule the follow-up appointment.   

## 2014-09-30 NOTE — Assessment & Plan Note (Signed)
Cholesterol is at goal on the current lipid regimen. No changes to the medications were made.  

## 2014-09-30 NOTE — Assessment & Plan Note (Signed)
Currently with no symptoms of angina. No further workup at this time. Continue current medication regimen. 

## 2014-09-30 NOTE — Assessment & Plan Note (Signed)
Blood pressure is well controlled on today's visit. No changes made to the medications. 

## 2014-09-30 NOTE — Assessment & Plan Note (Signed)
Mild to moderate aortic valve insufficiency and tricuspid valve regurgitation on prior echocardiogram in 2014 Murmur intensity has not increased over the past several years

## 2014-09-30 NOTE — Assessment & Plan Note (Signed)
Chronic left bundle branch block. No further workup at this time

## 2014-09-30 NOTE — Progress Notes (Signed)
Patient ID: Kayla [name], female    DOB: October 21, 1926, 81 y.o.   MRN: [id number]  HPI Comments: Kayla [name] is an 81 y/o woman with h/o CAD s/p CABG in 2001 at [facility], LBBB, hyperlipidemia, GERD and HTN.  Myoview in 2009 was normal.  Here for routine f/u  Of her coronary artery disease  In follow-up she reports that she is doing well. She denies any chest pain or shortness of breath with exertion She is active, exercises 2 days per week, goes out  with her girlfriend almost daily Denies any lower extremity edema In general has no complaints. Blood pressure at home typically in the 130 range Recent lab work showing essentially normal labs, creatinine 1.2, minimal white blood cell count in the urine. She reports primary care/Dr. Beverely Risen has ordered a kidney and bladder ultrasound. She is unclear why she needs this  EKG on today's visit shows normal sinus rhythm with left bundle branch block, no change from prior EKG   Other past medical history Previous admission to the hospital for chest discomfort. Rule out and was sent home Previous GERD symptoms, takes proton pump inhibitor  Lab work from July 2014  CHol 121, LDL 54, HDL 53     No Known Allergies  Outpatient Encounter Prescriptions as of 09/30/2014  Medication Sig  . amLODipine (NORVASC) 5 MG tablet Take 5 mg by mouth daily.   [name] aspirin 81 MG tablet Take 81 mg by mouth daily.    . B Complex Vitamins (B COMPLEX-B12) TABS Take 1 tablet by mouth daily.    . Cholecalciferol (VITAMIN D) 2000 UNITS tablet Take 2,000 Units by mouth daily.  . Cyanocobalamin (VITAMIN B 12 PO) Take 3,000 Units by mouth daily.  . hydrALAZINE (APRESOLINE) 10 MG tablet Take 10 mg by mouth 3 (three) times daily.  [name] levothyroxine (SYNTHROID, LEVOTHROID) 88 MCG tablet Take 88 mcg by mouth daily before breakfast.  . losartan-hydrochlorothiazide (HYZAAR) 50-12.5 MG per tablet Take 1 tablet by mouth daily.  [name] lovastatin (MEVACOR) 20 MG tablet Take 1 tablet (20  mg total) by mouth at bedtime.  . ranitidine (ZANTAC) 150 MG capsule Take 150 mg by mouth daily.    . [DISCONTINUED] levothyroxine (SYNTHROID, LEVOTHROID) 100 MCG tablet Take 1 tablet (100 mcg total) by mouth daily. (Patient not taking: Reported on 09/30/2014)  . [DISCONTINUED] losartan-hydrochlorothiazide (HYZAAR) 100-25 MG per tablet Take 1 tablet by mouth daily.  . [DISCONTINUED] meclizine (ANTIVERT) 12.5 MG tablet Take 12.5 mg by mouth 3 (three) times daily as needed.   . [DISCONTINUED] omeprazole (PRILOSEC) 20 MG capsule Take 1 capsule (20 mg total) by mouth 2 (two) times daily. (Patient not taking: Reported on 09/30/2014)  . [DISCONTINUED] VITAMIN D, CHOLECALCIFEROL, PO Take 5,000 Units by mouth daily.    Past Medical History  Diagnosis Date  . Hypertension   . Orthostatic headache     Mild  . Arthritis   . CAD (coronary artery disease)   . Hyperlipidemia   . Heart murmur   . Bundle branch block, left     Chronic  . GERD (gastroesophageal reflux disease)     Past Surgical History  Procedure Laterality Date  . Thyroid    . Knee surgery    . Cataract extraction Right   . Cardiac catheterization  2000    [facility]  . Coronary artery bypass graft  2001  . Upper gi endoscopy  2014    Social History  reports that she has never smoked.  She has never used smokeless tobacco. She reports that she does not drink alcohol or use illicit drugs.  Family History family history includes Heart attack in her sister; Heart attack (age of onset: 7042) in her brother; Heart failure (age of onset: 4348) in her sister; Heart failure (age of onset: 2476) in her mother.  Review of Systems  Constitutional: Negative.   Respiratory: Negative.   Cardiovascular: Negative.   Gastrointestinal: Negative.        GERD symptoms  Musculoskeletal: Negative.   Skin: Negative.   Neurological: Negative.   Hematological: Negative.   Psychiatric/Behavioral: Negative.   All other systems reviewed and are  negative.   BP 130/54 mmHg  Pulse 72  Ht 5\' 2"  (1.575 m)  Wt 161 lb 12 oz (73.369 kg)  BMI 29.58 kg/m2  Physical Exam  Constitutional: She is oriented to person, place, and time. She appears well-developed and well-nourished.  HENT:  Head: Normocephalic.  Nose: Nose normal.  Mouth/Throat: Oropharynx is clear and moist.  Eyes: Conjunctivae are normal. Pupils are equal, round, and reactive to light.  Neck: Normal range of motion. Neck supple. No JVD present.  Cardiovascular: Normal rate, regular rhythm, S1 normal, S2 normal and intact distal pulses.  Exam reveals no gallop and no friction rub.   Murmur heard.  Crescendo systolic murmur is present with a grade of 2/6  Pulmonary/Chest: Effort normal and breath sounds normal. No respiratory distress. She has no wheezes. She has no rales. She exhibits no tenderness.  Abdominal: Soft. Bowel sounds are normal. She exhibits no distension. There is no tenderness.  Musculoskeletal: Normal range of motion. She exhibits no edema or tenderness.  Lymphadenopathy:    She has no cervical adenopathy.  Neurological: She is alert and oriented to person, place, and time. Coordination normal.  Skin: Skin is warm and dry. No rash noted. No erythema.  Psychiatric: She has a normal mood and affect. Her behavior is normal. Judgment and thought content normal.    Assessment and Plan  Nursing note and vitals reviewed.

## 2014-10-01 DIAGNOSIS — N182 Chronic kidney disease, stage 2 (mild): Secondary | ICD-10-CM | POA: Diagnosis not present

## 2014-10-15 DIAGNOSIS — N182 Chronic kidney disease, stage 2 (mild): Secondary | ICD-10-CM | POA: Diagnosis not present

## 2014-10-15 DIAGNOSIS — H6120 Impacted cerumen, unspecified ear: Secondary | ICD-10-CM | POA: Diagnosis not present

## 2014-10-15 DIAGNOSIS — H60512 Acute actinic otitis externa, left ear: Secondary | ICD-10-CM | POA: Diagnosis not present

## 2014-10-15 DIAGNOSIS — I10 Essential (primary) hypertension: Secondary | ICD-10-CM | POA: Diagnosis not present

## 2014-10-15 DIAGNOSIS — R944 Abnormal results of kidney function studies: Secondary | ICD-10-CM | POA: Diagnosis not present

## 2014-11-02 DIAGNOSIS — R42 Dizziness and giddiness: Secondary | ICD-10-CM | POA: Diagnosis not present

## 2014-11-02 DIAGNOSIS — H6122 Impacted cerumen, left ear: Secondary | ICD-10-CM | POA: Diagnosis not present

## 2014-11-02 DIAGNOSIS — H903 Sensorineural hearing loss, bilateral: Secondary | ICD-10-CM | POA: Diagnosis not present

## 2014-11-12 ENCOUNTER — Ambulatory Visit
Admit: 2014-11-12 | Disposition: A | Discharge status: Home / Self Care | Payer: Self-pay | Attending: Otolaryngology | Admitting: Otolaryngology

## 2014-11-12 DIAGNOSIS — R42 Dizziness and giddiness: Secondary | ICD-10-CM | POA: Diagnosis not present

## 2014-11-12 DIAGNOSIS — H905 Unspecified sensorineural hearing loss: Secondary | ICD-10-CM | POA: Diagnosis not present

## 2014-11-12 DIAGNOSIS — G319 Degenerative disease of nervous system, unspecified: Secondary | ICD-10-CM | POA: Diagnosis not present

## 2014-11-17 DIAGNOSIS — J301 Allergic rhinitis due to pollen: Secondary | ICD-10-CM | POA: Diagnosis not present

## 2014-11-17 DIAGNOSIS — J019 Acute sinusitis, unspecified: Secondary | ICD-10-CM | POA: Diagnosis not present

## 2014-11-17 DIAGNOSIS — R42 Dizziness and giddiness: Secondary | ICD-10-CM | POA: Diagnosis not present

## 2014-12-11 DIAGNOSIS — R42 Dizziness and giddiness: Secondary | ICD-10-CM | POA: Diagnosis not present

## 2014-12-11 DIAGNOSIS — H9313 Tinnitus, bilateral: Secondary | ICD-10-CM | POA: Diagnosis not present

## 2015-01-20 DIAGNOSIS — R609 Edema, unspecified: Secondary | ICD-10-CM | POA: Diagnosis not present

## 2015-01-20 DIAGNOSIS — N390 Urinary tract infection, site not specified: Secondary | ICD-10-CM | POA: Diagnosis not present

## 2015-01-20 DIAGNOSIS — N182 Chronic kidney disease, stage 2 (mild): Secondary | ICD-10-CM | POA: Diagnosis not present

## 2015-01-20 DIAGNOSIS — I10 Essential (primary) hypertension: Secondary | ICD-10-CM | POA: Diagnosis not present

## 2015-02-17 ENCOUNTER — Telehealth: Payer: Self-pay | Admitting: *Deleted

## 2015-02-17 DIAGNOSIS — R0781 Pleurodynia: Secondary | ICD-10-CM | POA: Diagnosis not present

## 2015-02-17 DIAGNOSIS — R079 Chest pain, unspecified: Secondary | ICD-10-CM | POA: Diagnosis not present

## 2015-02-17 NOTE — Telephone Encounter (Signed)
Patient says she hasn't heard anything and is still having pain.  She is on her way to urgent care.

## 2015-02-17 NOTE — Telephone Encounter (Signed)
Noted  

## 2015-02-17 NOTE — Telephone Encounter (Signed)
Pt calling stating that she is having a "dull ache" in her rib cage.  It comes and goes.  It is on the left side.  Started last night. She called her pcp and they were not able to work her in Think its may indigestion but not sure.   PCP told her they could work her in tmrw, but was told by Dr Mariah Milling if she had any problems we would help. She is calling us just to see if we can see her.  Stated to her that if pain got worst that she should go to Urgent care but I will send this to our nurses.   Please advise.

## 2015-03-11 DIAGNOSIS — H2512 Age-related nuclear cataract, left eye: Secondary | ICD-10-CM | POA: Diagnosis not present

## 2015-04-12 ENCOUNTER — Encounter: Payer: Self-pay | Admitting: Cardiovascular Disease

## 2015-04-12 ENCOUNTER — Ambulatory Visit (INDEPENDENT_AMBULATORY_CARE_PROVIDER_SITE_OTHER): Payer: Medicare Other | Admitting: Cardiovascular Disease

## 2015-04-12 VITALS — BP 118/54 | HR 67 | Ht 62.0 in | Wt 164.5 lb

## 2015-04-12 DIAGNOSIS — I447 Left bundle-branch block, unspecified: Secondary | ICD-10-CM

## 2015-04-12 DIAGNOSIS — I25718 Atherosclerosis of autologous vein coronary artery bypass graft(s) with other forms of angina pectoris: Secondary | ICD-10-CM

## 2015-04-12 DIAGNOSIS — E785 Hyperlipidemia, unspecified: Secondary | ICD-10-CM

## 2015-04-12 DIAGNOSIS — R0602 Shortness of breath: Secondary | ICD-10-CM

## 2015-04-12 DIAGNOSIS — R011 Cardiac murmur, unspecified: Secondary | ICD-10-CM

## 2015-04-12 DIAGNOSIS — R010 Benign and innocent cardiac murmurs: Secondary | ICD-10-CM

## 2015-04-12 DIAGNOSIS — M7989 Other specified soft tissue disorders: Secondary | ICD-10-CM | POA: Insufficient documentation

## 2015-04-12 DIAGNOSIS — I10 Essential (primary) hypertension: Secondary | ICD-10-CM

## 2015-04-12 DIAGNOSIS — R944 Abnormal results of kidney function studies: Secondary | ICD-10-CM | POA: Diagnosis not present

## 2015-04-12 DIAGNOSIS — I350 Nonrheumatic aortic (valve) stenosis: Secondary | ICD-10-CM | POA: Insufficient documentation

## 2015-04-12 NOTE — Assessment & Plan Note (Signed)
Cholesterol is at goal on the current lipid regimen. No changes to the medications were made.  

## 2015-04-12 NOTE — Assessment & Plan Note (Signed)
Currently with no symptoms of angina. No further workup at this time. Continue current medication regimen. 

## 2015-04-12 NOTE — Progress Notes (Signed)
Patient ID: Kayla [name], female    DOB: [birth year], 81 y.o.   MRN: [id number]  HPI Comments: Ms. Blandino is an 81 y/o woman with h/o CAD s/p CABG in 2001 at [facility], LBBB, hyperlipidemia, GERD and HTN.  Myoview in 2009 was normal.  Here for routine f/u  Of her coronary artery disease Mild aortic valve stenosis  In follow-up, she reports that she is well. Blood pressure is well controlled, typically 130 systolic at home Significant leg swelling after long car trips. Is not wearing compression hose. She denies any chest pain or shortness of breath with exertion She is active, exercises 2 days per week, goes out  with her girlfriend almost daily  Lab work reviewed with her showing total cholesterol 120, LDL 43  EKG on today's visit shows normal sinus rhythm with  rate 67 bpm, eft bundle branch block, PVC noted   Other past medical history Previous admission to the hospital for chest discomfort. Rule out and was sent home Previous GERD symptoms, takes proton pump inhibitor   No Known Allergies  Outpatient Encounter Prescriptions as of 04/12/2015  Medication Sig  . amLODipine (NORVASC) 5 MG tablet Take 5 mg by mouth daily.   [name] aspirin 81 MG tablet Take 81 mg by mouth daily.    . B Complex Vitamins (B COMPLEX-B12) TABS Take 1 tablet by mouth daily.    . Cholecalciferol (VITAMIN D) 2000 UNITS tablet Take 2,000 Units by mouth daily.  . Cyanocobalamin (VITAMIN B 12 PO) Take 3,000 Units by mouth daily.  . hydrALAZINE (APRESOLINE) 10 MG tablet Take 10 mg by mouth 3 (three) times daily.  [name] levothyroxine (SYNTHROID, LEVOTHROID) 88 MCG tablet Take 88 mcg by mouth daily before breakfast.  . losartan-hydrochlorothiazide (HYZAAR) 50-12.5 MG per tablet Take 1 tablet by mouth daily.  . ranitidine (ZANTAC) 150 MG capsule Take 150 mg by mouth daily.    [name] lovastatin (MEVACOR) 20 MG tablet Take 1 tablet (20 mg total) by mouth at bedtime.   No facility-administered encounter medications on file as of  04/12/2015.    Past Medical History  Diagnosis Date  . Hypertension   . Orthostatic headache     Mild  . Arthritis   . CAD (coronary artery disease)   . Hyperlipidemia   . Heart murmur   . Bundle branch block, left     Chronic  . GERD (gastroesophageal reflux disease)     Past Surgical History  Procedure Laterality Date  . Thyroid    . Knee surgery    . Cataract extraction Right   . Cardiac catheterization  2000    [facility]  . Coronary artery bypass graft  2001  . Upper gi endoscopy  2014    Social History  reports that she has never smoked. She has never used smokeless tobacco. She reports that she does not drink alcohol or use illicit drugs.  Family History family history includes Heart attack in her sister; Heart attack (age of onset: 74) in her brother; Heart failure (age of onset: 75) in her sister; Heart failure (age of onset: 29) in her mother.  Review of Systems  Constitutional: Negative.   Respiratory: Negative.   Cardiovascular: Negative.   Gastrointestinal: Negative.   Musculoskeletal: Negative.   Skin: Negative.   Neurological: Negative.   Hematological: Negative.   Psychiatric/Behavioral: Negative.   All other systems reviewed and are negative.   BP 118/54 mmHg  Pulse 67  Ht  (1.575 m)  Wt  164 lb 8 oz (74.617 kg)  BMI 30.08 kg/m2  Physical Exam  Constitutional: She is oriented to person, place, and time. She appears well-developed and well-nourished.  HENT:  Head: Normocephalic.  Nose: Nose normal.  Mouth/Throat: Oropharynx is clear and moist.  Eyes: Conjunctivae are normal. Pupils are equal, round, and reactive to light.  Neck: Normal range of motion. Neck supple. No JVD present.  Cardiovascular: Normal rate, regular rhythm, S1 normal, S2 normal and intact distal pulses.  Exam reveals no gallop and no friction rub.   Murmur heard.  Crescendo systolic murmur is present with a grade of 2/6  Pulmonary/Chest: Effort normal and breath sounds  normal. No respiratory distress. She has no wheezes. She has no rales. She exhibits no tenderness.  Abdominal: Soft. Bowel sounds are normal. She exhibits no distension. There is no tenderness.  Musculoskeletal: Normal range of motion. She exhibits no edema or tenderness.  Lymphadenopathy:    She has no cervical adenopathy.  Neurological: She is alert and oriented to person, place, and time. Coordination normal.  Skin: Skin is warm and dry. No rash noted. No erythema.  Psychiatric: She has a normal mood and affect. Her behavior is normal. Judgment and thought content normal.    Assessment and Plan  Nursing note and vitals reviewed.

## 2015-04-12 NOTE — Assessment & Plan Note (Addendum)
Suggested she talk with primary care. Hydralazine could be held, losartan increased up to 100 mg daily

## 2015-04-12 NOTE — Patient Instructions (Addendum)
You are doing well.  Wear compression hose for leg swelling (amazon.[employer])  Ask primary care about stopping hydralazine But increase the losartan HCTZ up to 100/12.5 daily  If renal function getting worse, Hold the HCTZ, change to losartan 100 daily  Please call us if you have new issues that need to be addressed before your next appt.  Your physician wants you to follow-up in: 6 months.  You will receive a reminder letter in the mail two months in advance. If you don't receive a letter, please call our office to schedule the follow-up appointment.

## 2015-04-12 NOTE — Assessment & Plan Note (Signed)
Leg swelling is nonpitting, secondary to venous insufficiency. Recommended leg elevation, compression hose She is currently on HCTZ. If creatinine climbs, this could be discontinued

## 2015-04-12 NOTE — Assessment & Plan Note (Signed)
Likely with aortic valve sclerosis, possible borderline to mild aortic valve stenosis. Clinically asymptomatic

## 2015-04-26 ENCOUNTER — Other Ambulatory Visit: Payer: Self-pay | Admitting: Nurse Practitioner

## 2015-04-26 ENCOUNTER — Ambulatory Visit
Admission: RE | Admit: 2015-04-26 | Discharge: 2015-04-26 | Disposition: A | Discharge status: Home / Self Care | Payer: Medicare Other | Source: Ambulatory Visit | Attending: Nurse Practitioner | Admitting: Nurse Practitioner

## 2015-04-26 DIAGNOSIS — M25512 Pain in left shoulder: Secondary | ICD-10-CM

## 2015-04-26 DIAGNOSIS — E871 Hypo-osmolality and hyponatremia: Secondary | ICD-10-CM | POA: Diagnosis not present

## 2015-04-26 DIAGNOSIS — E039 Hypothyroidism, unspecified: Secondary | ICD-10-CM | POA: Diagnosis not present

## 2015-04-26 DIAGNOSIS — I10 Essential (primary) hypertension: Secondary | ICD-10-CM | POA: Diagnosis not present

## 2015-04-26 DIAGNOSIS — N182 Chronic kidney disease, stage 2 (mild): Secondary | ICD-10-CM | POA: Diagnosis not present

## 2015-05-03 DIAGNOSIS — M7552 Bursitis of left shoulder: Secondary | ICD-10-CM | POA: Diagnosis not present

## 2015-07-29 DIAGNOSIS — E039 Hypothyroidism, unspecified: Secondary | ICD-10-CM | POA: Diagnosis not present

## 2015-07-29 DIAGNOSIS — N182 Chronic kidney disease, stage 2 (mild): Secondary | ICD-10-CM | POA: Diagnosis not present

## 2015-07-29 DIAGNOSIS — E782 Mixed hyperlipidemia: Secondary | ICD-10-CM | POA: Diagnosis not present

## 2015-07-29 DIAGNOSIS — K219 Gastro-esophageal reflux disease without esophagitis: Secondary | ICD-10-CM | POA: Diagnosis not present

## 2015-07-29 DIAGNOSIS — I10 Essential (primary) hypertension: Secondary | ICD-10-CM | POA: Diagnosis not present

## 2015-08-04 ENCOUNTER — Other Ambulatory Visit: Payer: Self-pay | Admitting: Nurse Practitioner

## 2015-08-04 DIAGNOSIS — Z1231 Encounter for screening mammogram for malignant neoplasm of breast: Secondary | ICD-10-CM

## 2015-08-04 DIAGNOSIS — E2839 Other primary ovarian failure: Secondary | ICD-10-CM

## 2015-08-04 DIAGNOSIS — M810 Age-related osteoporosis without current pathological fracture: Secondary | ICD-10-CM

## 2015-08-18 ENCOUNTER — Ambulatory Visit
Admission: RE | Admit: 2015-08-18 | Discharge: 2015-08-18 | Disposition: A | Discharge status: Home / Self Care | Payer: Medicare Other | Source: Ambulatory Visit | Attending: Nurse Practitioner | Admitting: Nurse Practitioner

## 2015-08-18 ENCOUNTER — Other Ambulatory Visit: Payer: Self-pay | Admitting: Nurse Practitioner

## 2015-08-18 DIAGNOSIS — Z1231 Encounter for screening mammogram for malignant neoplasm of breast: Secondary | ICD-10-CM

## 2015-08-18 DIAGNOSIS — E2839 Other primary ovarian failure: Secondary | ICD-10-CM | POA: Diagnosis not present

## 2015-08-18 DIAGNOSIS — M810 Age-related osteoporosis without current pathological fracture: Secondary | ICD-10-CM | POA: Diagnosis not present

## 2015-10-08 DIAGNOSIS — E039 Hypothyroidism, unspecified: Secondary | ICD-10-CM | POA: Diagnosis not present

## 2015-10-08 DIAGNOSIS — Z0001 Encounter for general adult medical examination with abnormal findings: Secondary | ICD-10-CM | POA: Diagnosis not present

## 2015-10-08 DIAGNOSIS — I10 Essential (primary) hypertension: Secondary | ICD-10-CM | POA: Diagnosis not present

## 2015-10-08 DIAGNOSIS — M810 Age-related osteoporosis without current pathological fracture: Secondary | ICD-10-CM | POA: Diagnosis not present

## 2015-10-08 DIAGNOSIS — I2584 Coronary atherosclerosis due to calcified coronary lesion: Secondary | ICD-10-CM | POA: Diagnosis not present

## 2015-10-08 DIAGNOSIS — E782 Mixed hyperlipidemia: Secondary | ICD-10-CM | POA: Diagnosis not present

## 2015-10-11 ENCOUNTER — Encounter (INDEPENDENT_AMBULATORY_CARE_PROVIDER_SITE_OTHER): Payer: Self-pay

## 2015-10-11 ENCOUNTER — Ambulatory Visit (INDEPENDENT_AMBULATORY_CARE_PROVIDER_SITE_OTHER): Payer: Medicare Other | Admitting: Cardiovascular Disease

## 2015-10-11 ENCOUNTER — Encounter: Payer: Self-pay | Admitting: Cardiovascular Disease

## 2015-10-11 VITALS — BP 142/70 | HR 75 | Ht 61.0 in | Wt 165.2 lb

## 2015-10-11 DIAGNOSIS — I447 Left bundle-branch block, unspecified: Secondary | ICD-10-CM

## 2015-10-11 DIAGNOSIS — I10 Essential (primary) hypertension: Secondary | ICD-10-CM | POA: Diagnosis not present

## 2015-10-11 DIAGNOSIS — I350 Nonrheumatic aortic (valve) stenosis: Secondary | ICD-10-CM | POA: Diagnosis not present

## 2015-10-11 DIAGNOSIS — I25718 Atherosclerosis of autologous vein coronary artery bypass graft(s) with other forms of angina pectoris: Secondary | ICD-10-CM | POA: Diagnosis not present

## 2015-10-11 DIAGNOSIS — R600 Localized edema: Secondary | ICD-10-CM

## 2015-10-11 DIAGNOSIS — E785 Hyperlipidemia, unspecified: Secondary | ICD-10-CM

## 2015-10-11 NOTE — Patient Instructions (Signed)
You are doing well. No medication changes were made.  Please call us if you have new issues that need to be addressed before your next appt.  Your physician wants you to follow-up in: 6 months.  You will receive a reminder letter in the mail two months in advance. If you don't receive a letter, please call our office to schedule the follow-up appointment.   

## 2015-10-11 NOTE — Assessment & Plan Note (Signed)
Currently with no symptoms of angina. No further workup at this time. Continue current medication regimen. 

## 2015-10-11 NOTE — Assessment & Plan Note (Signed)
Cholesterol is at goal on the current lipid regimen. No changes to the medications were made.  

## 2015-10-11 NOTE — Assessment & Plan Note (Signed)
Blood pressure is well controlled on today's visit. No changes made to the medications. 

## 2015-10-11 NOTE — Progress Notes (Signed)
Patient ID: Kayla [name], female    DOB: 03-Aug-1926, 82 y.o.   MRN: [id number]  HPI Comments: Kayla [name] is an 82 y/o woman with h/o CAD s/p CABG in 2001 at [facility], LBBB, hyperlipidemia, GERD and HTN.  Myoview in 2009 was normal.  Here for routine f/u  Of her coronary artery disease Mild aortic valve stenosis History of PVCs  In follow-up today, she reports that she is doing well, blood pressure well controlled Active, does exercise class at the rehabilitation center Denies any chest symptoms concerning for angina Significant leg swelling after long car trips. Is not wearing compression hose  Lab work reviewed with her showing total cholesterol 120, LDL 43  EKG on today's visit shows normal sinus rhythm with  rate 67 bpm, eft bundle branch block, PVC noted  Other past medical history Previous admission to the hospital for chest discomfort. Rule out and was sent home Previous GERD symptoms, takes proton pump inhibitor   No Known Allergies  Outpatient Encounter Prescriptions as of 80/27/2017  Medication Sig  . amLODipine (NORVASC) 5 MG tablet Take 5 mg by mouth daily.   [name] aspirin 81 MG tablet Take 81 mg by mouth daily.    . B Complex Vitamins (B COMPLEX-B12) TABS Take 1 tablet by mouth daily.    . Cholecalciferol (VITAMIN D) 2000 UNITS tablet Take 2,000 Units by mouth daily.  . Cyanocobalamin (VITAMIN B 12 PO) Take 3,000 Units by mouth daily.  . fluticasone (VERAMYST) 27.5 MCG/SPRAY nasal spray Place 2 sprays into the nose daily.  . hydrALAZINE (APRESOLINE) 10 MG tablet Take 10 mg by mouth 3 (three) times daily.  [name] levothyroxine (SYNTHROID, LEVOTHROID) 88 MCG tablet Take 88 mcg by mouth daily before breakfast.  . losartan-hydrochlorothiazide (HYZAAR) 50-12.5 MG per tablet Take 1 tablet by mouth daily.  [name] lovastatin (MEVACOR) 20 MG tablet Take 1 tablet (20 mg total) by mouth at bedtime.  . meclizine (ANTIVERT) 12.5 MG tablet Take 12.5 mg by mouth 2 (two) times daily as needed.    . ranitidine (ZANTAC) 150 MG capsule Take 150 mg by mouth daily.    . [DISCONTINUED] ranitidine (ZANTAC) 150 MG capsule Take by mouth. Reported on 10/11/2015   No facility-administered encounter medications on file as of 10/11/2015.    Past Medical History  Diagnosis Date  . Hypertension   . Orthostatic headache     Mild  . Arthritis   . CAD (coronary artery disease)   . Hyperlipidemia   . Heart murmur   . Bundle branch block, left     Chronic  . GERD (gastroesophageal reflux disease)     Past Surgical History  Procedure Laterality Date  . Thyroid    . Knee surgery    . Cataract extraction Right   . Cardiac catheterization  2000    [facility]  . Coronary artery bypass graft  2001  . Upper gi endoscopy  2014  . Breast biopsy Left 2011    neg, stereo done by Dr. Evette Cristal    Social History  reports that she has never smoked. She has never used smokeless tobacco. She reports that she does not drink alcohol or use illicit drugs.  Family History family history includes Heart attack in her sister; Heart attack (age of onset: 35) in her brother; Heart failure (age of onset: 54) in her sister; Heart failure (age of onset: 68) in her mother.  Review of Systems  Constitutional: Negative.   Respiratory: Negative.   Cardiovascular: Negative.  Gastrointestinal: Negative.   Musculoskeletal: Negative.   Skin: Negative.   Neurological: Negative.   Hematological: Negative.   Psychiatric/Behavioral: Negative.   All other systems reviewed and are negative.   80P 142/70 mmHg  Pulse 75  Ht 5\' 1"  (1.549 m)  Wt 165 lb 4 oz (74.957 kg)  BMI 31.24 kg/m2  Physical Exam  Constitutional: She is oriented to person, place, and time. She appears well-developed and well-nourished.  HENT:  Head: Normocephalic.  Nose: Nose normal.  Mouth/Throat: Oropharynx is clear and moist.  Eyes: Conjunctivae are normal. Pupils are equal, round, and reactive to light.  Neck: Normal range of motion. Neck  supple. No JVD present.  Cardiovascular: Normal rate, regular rhythm, S1 normal, S2 normal and intact distal pulses.  Exam reveals no gallop and no friction rub.   Murmur heard.  Crescendo systolic murmur is present with a grade of 2/6  Pulmonary/Chest: Effort normal and breath sounds normal. No respiratory distress. She has no wheezes. She has no rales. She exhibits no tenderness.  Abdominal: Soft. Bowel sounds are normal. She exhibits no distension. There is no tenderness.  Musculoskeletal: Normal range of motion. She exhibits no edema or tenderness.  Lymphadenopathy:    She has no cervical adenopathy.  Neurological: She is alert and oriented to person, place, and time. Coordination normal.  Skin: Skin is warm and dry. No rash noted. No erythema.  Psychiatric: She has a normal mood and affect. Her behavior is normal. Judgment and thought content normal.    Assessment and Plan  Nursing note and vitals reviewed.

## 2015-10-11 NOTE — Assessment & Plan Note (Signed)
Secondary to venous insufficiency. Recommended compression hose, leg elevation

## 2015-10-13 DIAGNOSIS — I10 Essential (primary) hypertension: Secondary | ICD-10-CM | POA: Diagnosis not present

## 2015-10-13 DIAGNOSIS — E559 Vitamin D deficiency, unspecified: Secondary | ICD-10-CM | POA: Diagnosis not present

## 2015-10-13 DIAGNOSIS — Z0001 Encounter for general adult medical examination with abnormal findings: Secondary | ICD-10-CM | POA: Diagnosis not present

## 2015-10-13 DIAGNOSIS — R945 Abnormal results of liver function studies: Secondary | ICD-10-CM | POA: Diagnosis not present

## 2015-10-13 DIAGNOSIS — E782 Mixed hyperlipidemia: Secondary | ICD-10-CM | POA: Diagnosis not present

## 2016-01-17 DIAGNOSIS — E871 Hypo-osmolality and hyponatremia: Secondary | ICD-10-CM | POA: Diagnosis not present

## 2016-01-17 DIAGNOSIS — N182 Chronic kidney disease, stage 2 (mild): Secondary | ICD-10-CM | POA: Diagnosis not present

## 2016-01-17 DIAGNOSIS — I679 Cerebrovascular disease, unspecified: Secondary | ICD-10-CM | POA: Diagnosis not present

## 2016-01-17 DIAGNOSIS — H8143 Vertigo of central origin, bilateral: Secondary | ICD-10-CM | POA: Diagnosis not present

## 2016-01-17 DIAGNOSIS — I10 Essential (primary) hypertension: Secondary | ICD-10-CM | POA: Diagnosis not present

## 2016-02-09 DIAGNOSIS — I6523 Occlusion and stenosis of bilateral carotid arteries: Secondary | ICD-10-CM | POA: Diagnosis not present

## 2016-02-09 DIAGNOSIS — R0989 Other specified symptoms and signs involving the circulatory and respiratory systems: Secondary | ICD-10-CM | POA: Diagnosis not present

## 2016-02-21 ENCOUNTER — Other Ambulatory Visit: Payer: Self-pay | Admitting: Internal Medicine

## 2016-02-21 DIAGNOSIS — R9439 Abnormal result of other cardiovascular function study: Secondary | ICD-10-CM

## 2016-02-28 ENCOUNTER — Ambulatory Visit: Payer: Self-pay | Admitting: Cardiovascular Disease

## 2016-03-02 ENCOUNTER — Ambulatory Visit (INDEPENDENT_AMBULATORY_CARE_PROVIDER_SITE_OTHER): Payer: Medicare Other | Admitting: Cardiovascular Disease

## 2016-03-02 ENCOUNTER — Encounter: Payer: Self-pay | Admitting: Cardiovascular Disease

## 2016-03-02 VITALS — BP 148/60 | HR 63 | Ht 61.0 in | Wt 166.2 lb

## 2016-03-02 DIAGNOSIS — I25718 Atherosclerosis of autologous vein coronary artery bypass graft(s) with other forms of angina pectoris: Secondary | ICD-10-CM

## 2016-03-02 DIAGNOSIS — I10 Essential (primary) hypertension: Secondary | ICD-10-CM

## 2016-03-02 DIAGNOSIS — I350 Nonrheumatic aortic (valve) stenosis: Secondary | ICD-10-CM | POA: Diagnosis not present

## 2016-03-02 DIAGNOSIS — E785 Hyperlipidemia, unspecified: Secondary | ICD-10-CM | POA: Diagnosis not present

## 2016-03-02 DIAGNOSIS — I6521 Occlusion and stenosis of right carotid artery: Secondary | ICD-10-CM

## 2016-03-02 NOTE — Progress Notes (Signed)
Cardiology Office Note  Date:  03/02/2016   ID:  Kayla [name], DOB 02/26/1927, MRN [id number]  PCP:  Kayla [name]   Chief Complaint  Patient presents with  . Other    6 month follow up. Meds reviewed by the patient verbally. Pt. had a carotid u/s and would like to discuss the results and the pt. c/o dizziness at times.     HPI:  Kayla [name] is an 82 y/o woman with h/o CAD s/p CABG in 2001 at [facility], LBBB, hyperlipidemia, GERD and HTN.  Myoview in 2009 was normal.  Here for routine f/u  Of her coronary artery disease Mild aortic valve stenosis History of PVCs  In follow-up, she reports that she is doing well overall Reports that she did Hurt her left hip, heard a pop Slowly getting better Some pain in left knee   blood pressure well controlled at home, mildly elevated on initial check today in the office Active, does exercise class at the rehabilitation center Denies any chest symptoms concerning for angina  Carotid ultrasound done at outside facility showing 60% right carotid stenosis, minimal on the left  Lab work reviewed with her showing total cholesterol 126, March 2017  EKG on today's visit shows normal sinus rhythm with  rate 67 bpm, left bundle branch block,   Other past medical history Previous admission to the hospital for chest discomfort. Rule out and was sent home Previous GERD symptoms, takes proton pump inhibitor  PMH:   has a past medical history of Arthritis; Bundle branch block, left; CAD (coronary artery disease); GERD (gastroesophageal reflux disease); Heart murmur; Hyperlipidemia; Hypertension; and Orthostatic headache.  PSH:    Past Surgical History:  Procedure Laterality Date  . BREAST BIOPSY Left 2011   neg, stereo done by Kayla [name]  . CARDIAC CATHETERIZATION  2000   [facility]  . CATARACT EXTRACTION Right   . CORONARY ARTERY BYPASS GRAFT  2001  . KNEE SURGERY    . thyroid    . UPPER GI ENDOSCOPY  2014    Current Outpatient  Prescriptions  Medication Sig Dispense Refill  . amLODipine (NORVASC) 5 MG tablet Take 5 mg by mouth daily.     [name]. aspirin 81 MG tablet Take 81 mg by mouth daily.      . B Complex Vitamins (B COMPLEX-B12) TABS Take 1 tablet by mouth daily.      . Cholecalciferol (VITAMIN D) 2000 UNITS tablet Take 2,000 Units by mouth daily.    . Cyanocobalamin (VITAMIN B 12 PO) Take 3,000 Units by mouth daily.    . fluticasone (VERAMYST) 27.5 MCG/SPRAY nasal spray Place 2 sprays into the nose daily.    [name]. levothyroxine (SYNTHROID, LEVOTHROID) 88 MCG tablet Take 88 mcg by mouth daily before breakfast.    . losartan-hydrochlorothiazide (HYZAAR) 100-12.5 MG tablet Take 1 tablet by mouth daily.    [name]. lovastatin (MEVACOR) 20 MG tablet Take 20 mg by mouth at bedtime.    . meclizine (ANTIVERT) 12.5 MG tablet Take 12.5 mg by mouth 2 (two) times daily as needed.     [name]. omeprazole (PRILOSEC) 40 MG capsule Take 40 mg by mouth daily.    . ranitidine (ZANTAC) 150 MG capsule Take 150 mg by mouth daily.       No current facility-administered medications for this visit.      Allergies:   Review of patient's allergies indicates no known allergies.   Social History:  The patient  reports that she has  never smoked. She has never used smokeless tobacco. She reports that she does not drink alcohol or use drugs.   Family History:   family history includes Heart attack in her sister; Heart attack (age of onset: 7742) in her brother; Heart failure (age of onset: 4548) in her sister; Heart failure (age of onset: 4876) in her mother.    Review of Systems: Review of Systems  Constitutional: Negative.   Respiratory: Negative.   Cardiovascular: Negative.   Gastrointestinal: Negative.   Musculoskeletal: Positive for joint pain.  Neurological: Negative.   Psychiatric/Behavioral: Negative.   All other systems reviewed and are negative.    PHYSICAL EXAM: VS:  BP (!) 148/60 (BP Location: Left Arm, Patient Position: Sitting, Cuff Size:  Normal)   Pulse 63   Ht 5\' 1"  (1.549 m)   Wt 166 lb 4 oz (75.4 kg)   BMI 31.41 kg/m  , BMI Body mass index is 31.41 kg/m. GEN: Well nourished, well developed, in no acute distress  HEENT: normal  Neck: no JVD, carotid bruits, or masses Cardiac: RRR; 1+ SEM RSB,  no rubs, or gallops,no edema  Respiratory:  clear to auscultation bilaterally, normal work of breathing GI: soft, nontender, nondistended, + BS MS: no deformity or atrophy  Skin: warm and dry, no rash Neuro:  Strength and sensation are intact Psych: euthymic mood, full affect    Recent Labs: No results found for requested labs within last 8760 hours.    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 No further workup at this time. Continue current medication regimen.  Essential hypertension - Plan: EKG 12-Lead Blood pressure Borderline elevated, recommended she monitor blood pressure at home. No changes made to the medications.  Hyperlipidemia - Plan: VAS US CAROTID Cholesterol is at goal on the current lipid regimen. No changes to the medications were made.  Carotid stenosis, right - Plan: VAS US CAROTID Results reviewed with her in detail Order place to have Repeat carotid ultrasound in one year.  She prefers to have ultrasound done through our office rather than through primary care 60% on the right, done at outside facility Interestingly, had carotid ultrasound several years ago and had no notable disease at that  time. Likely technical error    Total encounter time more than 25 minutes  Greater than 50% was spent in counseling and coordination of care with the patient   Disposition:   F/U  6 months   Orders Placed This Encounter  Procedures  . EKG 12-Lead     Signed, Dossie Arbourim Ruthann Angulo, M.D., Ph.D. 03/02/2016  [facility], [place] [phone]

## 2016-03-02 NOTE — Patient Instructions (Signed)
Medication Instructions:   No changes  Labwork:  No new labs  Testing/Procedures:  No new testing today Carotid ultrasound for one year away, for stenosis on there right  Follow-Up: It was a pleasure seeing you in the office today. Please call us if you have new issues that need to be addressed before your next appt.  [phone]  Your physician wants you to follow-up in: 6 months.  You will receive a reminder letter in the mail two months in advance. If you don't receive a letter, please call our office to schedule the follow-up appointment.  If you need a refill on your cardiac medications before your next appointment, please call your pharmacy.

## 2016-03-03 ENCOUNTER — Ambulatory Visit: Payer: Medicare Other

## 2016-03-07 DIAGNOSIS — M71559 Other bursitis, not elsewhere classified, unspecified hip: Secondary | ICD-10-CM | POA: Diagnosis not present

## 2016-03-07 DIAGNOSIS — M7070 Other bursitis of hip, unspecified hip: Secondary | ICD-10-CM | POA: Insufficient documentation

## 2016-03-27 ENCOUNTER — Ambulatory Visit: Payer: Self-pay | Admitting: Cardiovascular Disease

## 2016-03-28 ENCOUNTER — Emergency Department: Payer: Medicare Other

## 2016-03-28 ENCOUNTER — Emergency Department
Admission: EM | Admit: 2016-03-28 | Discharge: 2016-03-28 | Disposition: A | Discharge status: Home / Self Care | Payer: Medicare Other | Attending: Emergency Medicine | Admitting: Emergency Medicine

## 2016-03-28 ENCOUNTER — Encounter: Payer: Self-pay | Admitting: Emergency Medicine

## 2016-03-28 DIAGNOSIS — K8050 Calculus of bile duct without cholangitis or cholecystitis without obstruction: Secondary | ICD-10-CM | POA: Diagnosis not present

## 2016-03-28 DIAGNOSIS — Z7982 Long term (current) use of aspirin: Secondary | ICD-10-CM | POA: Insufficient documentation

## 2016-03-28 DIAGNOSIS — I2510 Atherosclerotic heart disease of native coronary artery without angina pectoris: Secondary | ICD-10-CM | POA: Diagnosis not present

## 2016-03-28 DIAGNOSIS — R1011 Right upper quadrant pain: Secondary | ICD-10-CM

## 2016-03-28 DIAGNOSIS — Z79899 Other long term (current) drug therapy: Secondary | ICD-10-CM | POA: Insufficient documentation

## 2016-03-28 DIAGNOSIS — K8020 Calculus of gallbladder without cholecystitis without obstruction: Secondary | ICD-10-CM | POA: Diagnosis not present

## 2016-03-28 DIAGNOSIS — I10 Essential (primary) hypertension: Secondary | ICD-10-CM | POA: Insufficient documentation

## 2016-03-28 DIAGNOSIS — Z951 Presence of aortocoronary bypass graft: Secondary | ICD-10-CM | POA: Insufficient documentation

## 2016-03-28 DIAGNOSIS — R112 Nausea with vomiting, unspecified: Secondary | ICD-10-CM | POA: Diagnosis not present

## 2016-03-28 LAB — CBC
HCT: 38.3 % (ref 35.0–47.0)
HEMOGLOBIN: 13.2 g/dL (ref 12.0–16.0)
MCH: 30.6 pg (ref 26.0–34.0)
MCHC: 34.6 g/dL (ref 32.0–36.0)
MCV: 88.6 fL (ref 80.0–100.0)
Platelets: 183 10*3/uL (ref 150–440)
RBC: 4.32 MIL/uL (ref 3.80–5.20)
RDW: 15.6 % — ABNORMAL HIGH (ref 11.5–14.5)
WBC: 7.5 10*3/uL (ref 3.6–11.0)

## 2016-03-28 LAB — COMPREHENSIVE METABOLIC PANEL
ALT: 14 U/L (ref 14–54)
AST: 17 U/L (ref 15–41)
Albumin: 4.3 g/dL (ref 3.5–5.0)
Alkaline Phosphatase: 55 U/L (ref 38–126)
Anion gap: 8 (ref 5–15)
BUN: 19 mg/dL (ref 6–20)
CHLORIDE: 98 mmol/L — AB (ref 101–111)
CO2: 27 mmol/L (ref 22–32)
Calcium: 9 mg/dL (ref 8.9–10.3)
Creatinine, Ser: 1.17 mg/dL — ABNORMAL HIGH (ref 0.44–1.00)
GFR calc Af Amer: 47 mL/min — ABNORMAL LOW (ref >60)
GFR calc non Af Amer: 41 mL/min — ABNORMAL LOW (ref >60)
Glucose, Bld: 112 mg/dL — ABNORMAL HIGH (ref 65–99)
Potassium: 3.9 mmol/L (ref 3.5–5.1)
SODIUM: 133 mmol/L — AB (ref 135–145)
Total Bilirubin: 0.7 mg/dL (ref 0.3–1.2)
Total Protein: 7.2 g/dL (ref 6.5–8.1)

## 2016-03-28 LAB — LIPASE, BLOOD: LIPASE: 15 U/L (ref 11–51)

## 2016-03-28 MED ORDER — OXYCODONE-ACETAMINOPHEN 5-325 MG PO TABS: 1.0000 | ORAL_TABLET | Freq: Four times a day (QID) | ORAL | 0 refills | Status: DC | PRN

## 2016-03-28 MED ORDER — IOPAMIDOL (ISOVUE-300) INJECTION 61%
75.0000 mL | Freq: Once | INTRAVENOUS | Status: AC | PRN
  Administered 2016-03-28: 75 mL via INTRAVENOUS

## 2016-03-28 MED ORDER — ONDANSETRON 4 MG PO TBDP: 4.0000 mg | ORAL_TABLET | Freq: Three times a day (TID) | ORAL | 0 refills | Status: DC | PRN

## 2016-03-28 MED ORDER — ONDANSETRON HCL 4 MG/2ML IJ SOLN: 4.0000 mg | Freq: Once | INTRAMUSCULAR | Status: DC

## 2016-03-28 MED ORDER — IOPAMIDOL (ISOVUE-300) INJECTION 61%
30.0000 mL | Freq: Once | INTRAVENOUS | Status: AC | PRN
  Administered 2016-03-28: 30 mL via ORAL

## 2016-03-28 NOTE — ED Notes (Signed)
Pt to xray and ultrasound  

## 2016-03-28 NOTE — ED Triage Notes (Signed)
This pt was triaged by this Clinical research associatewriter.

## 2016-03-28 NOTE — ED Notes (Signed)
Sprite and crackers provided  Pt to attempt PO challenge

## 2016-03-28 NOTE — ED Triage Notes (Signed)
Pt with RUQ pain and vomiting started this am.

## 2016-03-28 NOTE — ED Provider Notes (Signed)
[facility] Emergency Department Provider Note  ____________________________________________  Time seen: Approximately 2:55 PM  I have reviewed the triage vital signs and the nursing notes.   HISTORY  Chief Complaint Abdominal Pain (RUQ)    HPI Kayla [name] is a 82 y.o. female who complains of right upper quadrant abdominal pain since 4 AM this morning. It's been constant, worse with eating. No alleviating factors. Not exertional. Vomited this morning. No chest pain shortness of breath fever chills or sweats. Nonradiating.   Past Medical History:  Diagnosis Date  . Arthritis   . Bundle branch block, left    Chronic  . CAD (coronary artery disease)   . GERD (gastroesophageal reflux disease)   . Heart murmur   . Hyperlipidemia   . Hypertension   . Orthostatic headache    Mild     Patient Active Problem List   Diagnosis Date Noted  . Bilateral leg edema 10/11/2015  . Aortic valve stenosis 04/12/2015  . Leg swelling 04/12/2015  . Atrial ectopy 03/30/2014  . Essential hypertension 01/09/2013  . Hyperlipidemia 07/07/2011  . MURMUR 06/10/2010  . HEARTBURN 06/10/2010  . CAD, AUTOLOGOUS BYPASS GRAFT 04/15/2009  . LBBB (left bundle branch block) 04/15/2009     Past Surgical History:  Procedure Laterality Date  . BREAST BIOPSY Left 2011   neg, stereo done by Dr. Evette Cristal  . CARDIAC CATHETERIZATION  2000   [facility]  . CATARACT EXTRACTION Right   . CORONARY ARTERY BYPASS GRAFT  2001  . KNEE SURGERY    . thyroid    . UPPER GI ENDOSCOPY  2014     Prior to Admission medications   Medication Sig Start Date End Date Taking? Authorizing Provider  amLODipine (NORVASC) 5 MG tablet Take 5 mg by mouth daily.  10/15/12  Yes Historical Provider, MD  aspirin 81 MG tablet Take 81 mg by mouth daily.     Yes Historical Provider, MD  B Complex Vitamins (B COMPLEX-B12) TABS Take 1 tablet by mouth daily.     Yes Historical Provider, MD  Cholecalciferol  (VITAMIN D) 2000 UNITS tablet Take 2,000 Units by mouth daily.   Yes Historical Provider, MD  Cyanocobalamin (VITAMIN B 12 PO) Take 3,000 Units by mouth daily.   Yes Historical Provider, MD  levothyroxine (SYNTHROID, LEVOTHROID) 88 MCG tablet Take 88 mcg by mouth daily before breakfast.   Yes Historical Provider, MD  losartan-hydrochlorothiazide (HYZAAR) 100-12.5 MG tablet Take 1 tablet by mouth daily.   Yes Historical Provider, MD  lovastatin (MEVACOR) 20 MG tablet Take 20 mg by mouth at bedtime.   Yes Historical Provider, MD  meclizine (ANTIVERT) 12.5 MG tablet Take 12.5 mg by mouth daily.    Yes Historical Provider, MD  meloxicam (MOBIC) 15 MG tablet Take 15 mg by mouth daily.   Yes Historical Provider, MD  omeprazole (PRILOSEC) 40 MG capsule Take 40 mg by mouth every evening.    Yes Historical Provider, MD  ranitidine (ZANTAC) 150 MG capsule Take 150 mg by mouth daily.     Yes Historical Provider, MD  ondansetron (ZOFRAN ODT) 4 MG disintegrating tablet Take 1 tablet (4 mg total) by mouth every 8 (eight) hours as needed for nausea or vomiting. 03/28/16   Sharman Cheek, MD  oxyCODONE-acetaminophen (ROXICET) 5-325 MG tablet Take 1 tablet by mouth every 6 (six) hours as needed for severe pain. 03/28/16   Sharman Cheek, MD     Allergies Review of patient's allergies indicates no known allergies.  Family History  Problem Relation Age of Onset  . Heart failure Mother 55  . Heart failure Sister 74  . Heart attack Sister   . Heart attack Brother 58    Social History Social History  Substance Use Topics  . Smoking status: Never Smoker  . Smokeless tobacco: Never Used  . Alcohol use No    Review of Systems  Constitutional:   No fever or chills.  ENT:   No sore throat. No rhinorrhea. Cardiovascular:   No chest pain. Respiratory:   No dyspnea or cough. Gastrointestinal:   Right upper quadrant abdominal pain with vomiting without diarrhea.    10-point ROS otherwise  negative.  ____________________________________________   PHYSICAL EXAM:  VITAL SIGNS: ED Triage Vitals  Enc Vitals Group     BP 03/28/16 1124 (!) 180/80     Pulse Rate 03/28/16 1124 70     Resp 03/28/16 1124 18     Temp 03/28/16 1124 98.1 F (36.7 C)     Temp Source 03/28/16 1124 Oral     SpO2 03/28/16 1124 98 %     Weight 03/28/16 1124 166 lb (75.3 kg)     Height 03/28/16 1124 5\' 1"  (1.549 m)     Head Circumference --      Peak Flow --      Pain Score 03/28/16 1140 6     Pain Loc --      Pain Edu? --      Excl. in GC? --     Vital signs reviewed, nursing assessments reviewed.   Constitutional:   Alert and oriented. Well appearing and in no distress. Eyes:   No scleral icterus. No conjunctival pallor. PERRL. EOMI.  No nystagmus. ENT   Head:   Normocephalic and atraumatic.   Nose:   No congestion/rhinnorhea. No septal hematoma   Mouth/Throat:   MMM, no pharyngeal erythema. No peritonsillar mass.    Neck:   No stridor. No SubQ emphysema. No meningismus. Hematological/Lymphatic/Immunilogical:   No cervical lymphadenopathy. Cardiovascular:   RRR. Symmetric bilateral radial and DP pulses.  No murmurs.  Respiratory:   Normal respiratory effort without tachypnea nor retractions. Breath sounds are clear and equal bilaterally. No wheezes/rales/rhonchi. Chest wall nontender Gastrointestinal:   Soft with focal right upper quadrant tenderness. Non distended. There is no CVA tenderness.  No rebound, rigidity, or guarding. Genitourinary:   deferred Musculoskeletal:   Nontender with normal range of motion in all extremities. No joint effusions.  No lower extremity tenderness.  No edema. Neurologic:   Normal speech and language.  CN 2-10 normal. Motor grossly intact. No gross focal neurologic deficits are appreciated.  Skin:    Skin is warm, dry and intact. No rash noted.  No petechiae, purpura, or bullae.  ____________________________________________    LABS  (pertinent positives/negatives) (all labs ordered are listed, but only abnormal results are displayed) Labs Reviewed  COMPREHENSIVE METABOLIC PANEL - Abnormal; Notable for the following:       Result Value   Sodium 133 (*)    Chloride 98 (*)    Glucose, Bld 112 (*)    Creatinine, Ser 1.17 (*)    GFR calc non Af Amer 41 (*)    GFR calc Af Amer 47 (*)    All other components within normal limits  CBC - Abnormal; Notable for the following:    RDW 15.6 (*)    All other components within normal limits  LIPASE, BLOOD   ____________________________________________   EKG  Interpreted  by me Sinus rhythm rate of 70, normal axis and intervals. Left bundle branch block. Normal ST segments. Isolated T-wave inversion in aVL which is nonspecific, likely repoll abnormality. There are 2 PVCs strip.  ____________________________________________    RADIOLOGY  Chest x-ray unremarkable except for possible evidence of bowel herniation through the diaphragm and the chest Ultrasound reveals cholelithiasis without cholecystitis. CT abdomen and pelvis unremarkable. Hiatal hernia but no bowel herniation.  ____________________________________________   PROCEDURES Procedures  ____________________________________________   INITIAL IMPRESSION / ASSESSMENT AND PLAN / ED COURSE  Pertinent labs & imaging results that were available during my care of the patient were reviewed by me and considered in my medical decision making (see chart for details).  Patient presents prep quadrant abdominal pain and tenderness associated with vomiting since this morning. She is not in distress and does not appear septic but we'll get an ultrasound to evaluate for cholelithiasis or cholecystitis. Also check chest x-ray to ensure she does not have a basilar pneumonia.     Clinical Course  Comment By Time  US/CXR reveals gallstones and possible bowel hernia through diaphragm.  CT A/P ordered to eval possible  hernia. If non-surgical, will PO challenge to eval for suitability of outpt f/u of biliary colic.  Sharman CheekPhillip Jamez Ambrocio, MD 09/12 1704    ----------------------------------------- 8:21 PM on 03/28/2016 -----------------------------------------  Imaging reveals cholelithiasis, no other acute abnormalities. Symptoms are resolved. Pain is tolerating oral intake. Appears to be biliary colic. We'll provide Percocet and Zofran prescription, follow-up with surgery. Patient is agreeable with this plan. Hiatal hernia is old according to the patient ____________________________________________   FINAL CLINICAL IMPRESSION(S) / ED DIAGNOSES  Final diagnoses:  Right upper quadrant pain  Biliary colic       Portions of this note were generated with dragon dictation software. Dictation errors may occur despite best attempts at proofreading.    Sharman CheekPhillip Katrece Roediger, MD 03/28/16 2021

## 2016-03-30 ENCOUNTER — Encounter: Payer: Self-pay | Admitting: *Deleted

## 2016-03-30 DIAGNOSIS — I10 Essential (primary) hypertension: Secondary | ICD-10-CM | POA: Diagnosis not present

## 2016-03-30 DIAGNOSIS — I6523 Occlusion and stenosis of bilateral carotid arteries: Secondary | ICD-10-CM | POA: Diagnosis not present

## 2016-03-30 DIAGNOSIS — K810 Acute cholecystitis: Secondary | ICD-10-CM | POA: Diagnosis not present

## 2016-03-30 DIAGNOSIS — N182 Chronic kidney disease, stage 2 (mild): Secondary | ICD-10-CM | POA: Diagnosis not present

## 2016-04-06 ENCOUNTER — Encounter: Payer: Self-pay | Admitting: General Surgery

## 2016-04-06 ENCOUNTER — Ambulatory Visit (INDEPENDENT_AMBULATORY_CARE_PROVIDER_SITE_OTHER): Payer: Medicare Other | Admitting: General Surgery

## 2016-04-06 VITALS — BP 144/80 | HR 72 | Resp 16 | Ht 61.0 in | Wt 166.0 lb

## 2016-04-06 DIAGNOSIS — K8020 Calculus of gallbladder without cholecystitis without obstruction: Secondary | ICD-10-CM | POA: Diagnosis not present

## 2016-04-06 NOTE — Patient Instructions (Addendum)
The patient is aware to call back for any questions or concerns.  Laparoscopic Cholecystectomy Laparoscopic cholecystectomy is surgery to remove the gallbladder. The gallbladder is located in the upper right part of the abdomen, behind the liver. It is a storage sac for bile, which is produced in the liver. Bile aids in the digestion and absorption of fats. Cholecystectomy is often done for inflammation of the gallbladder (cholecystitis). This condition is usually caused by a buildup of gallstones (cholelithiasis) in the gallbladder. Gallstones can block the flow of bile, and that can result in inflammation and pain. In severe cases, emergency surgery may be required. If emergency surgery is not required, you will have time to prepare for the procedure. Laparoscopic surgery is an alternative to open surgery. Laparoscopic surgery has a shorter recovery time. Your common bile duct may also need to be examined during the procedure. If stones are found in the common bile duct, they may be removed. LET [facility] CARE PROVIDER KNOW ABOUT:  Any allergies you have.  All medicines you are taking, including vitamins, herbs, eye drops, creams, and over-the-counter medicines.  Previous problems you or members of your family have had with the use of anesthetics.  Any blood disorders you have.  Previous surgeries you have had.  Any medical conditions you have. RISKS AND COMPLICATIONS Generally, this is a safe procedure. However, problems may occur, including:  Infection.  Bleeding.  Allergic reactions to medicines.  Damage to other structures or organs.  A stone remaining in the common bile duct.  A bile leak from the cyst duct that is clipped when your gallbladder is removed.  The need to convert to open surgery, which requires a larger incision in the abdomen. This may be necessary if your surgeon thinks that it is not safe to continue with a laparoscopic procedure. BEFORE THE  PROCEDURE  Ask your health care provider about:  Changing or stopping your regular medicines. This is especially important if you are taking diabetes medicines or blood thinners.  Taking medicines such as aspirin and ibuprofen. These medicines can thin your blood. Do not take these medicines before your procedure if your health care provider instructs you not to.  Follow instructions from your health care provider about eating or drinking restrictions.  Let your health care provider know if you develop a cold or an infection before surgery.  Plan to have someone take you home after the procedure.  Ask your health care provider how your surgical site will be marked or identified.  You may be given antibiotic medicine to help prevent infection. PROCEDURE  To reduce your risk of infection:  Your health care team will wash or sanitize their hands.  Your skin will be washed with soap.  An IV tube may be inserted into one of your veins.  You will be given a medicine to make you fall asleep (general anesthetic).  A breathing tube will be placed in your mouth.  The surgeon will make several small cuts (incisions) in your abdomen.  A thin, lighted tube (laparoscope) that has a tiny camera on the end will be inserted through one of the small incisions. The camera on the laparoscope will send a picture to a TV screen (monitor) in the operating room. This will give the surgeon a good view inside your abdomen.  A gas will be pumped into your abdomen. This will expand your abdomen to give the surgeon more room to perform the surgery.  Other tools that are needed  for the procedure will be inserted through the other incisions. The gallbladder will be removed through one of the incisions.  After your gallbladder has been removed, the incisions will be closed with stitches (sutures), staples, or skin glue.  Your incisions may be covered with a bandage (dressing). The procedure may vary among  health care providers and hospitals. AFTER THE PROCEDURE  Your blood pressure, heart rate, breathing rate, and blood oxygen level will be monitored often until the medicines you were given have worn off.  You will be given medicines as needed to control your pain.   This information is not intended to replace advice given to you by your health care provider. Make sure you discuss any questions you have with your health care provider.   Document Released: 07/03/2005 Document Revised: 03/24/2015 Document Reviewed: 02/12/2013 Elsevier Interactive Patient Education [employer]2016 [employer].  The patient [facility]. She will pre admit at the hospital on 04/17/16. She will be seen by her PCP, Dr Beverely RisenFozia Khan on 04/12/16 and will see if she is willing to clear her for surgery. The patient is aware of date and instructions.

## 2016-04-06 NOTE — Progress Notes (Addendum)
Patient ID: Kayla [name], female   DOB: 06/30/1927, 82 y.o.   MRN: [id number]  Chief Complaint  Patient presents with  . Cholelithiasis    HPI Kayla [name] is a 82 y.o. female who presents for an evaluation for gallstones. Patient [facility] CT and [facility]. The pain began last Tuesday. The pain was under her right rib area, lasting all day. She was nauseated. She vomited Tuesday morning. She did go to the ED for further evaluation. She was sick the rest of the week. She got better yesterday. Never had this pain before.  She is here today with her daughter, Kayla [name]. I have reviewed the history of present illness with the patient.  HPI  Past Medical History:  Diagnosis Date  . Anginal pain (HCC)   . Arthritis   . Bundle branch block, left    Chronic  . CAD (coronary artery disease)   . GERD (gastroesophageal reflux disease)   . Heart murmur   . Hyperlipidemia   . Hypertension   . Hypothyroidism   . Orthostatic headache    Mild    Past Surgical History:  Procedure Laterality Date  . APPENDECTOMY  1957  . BREAST BIOPSY Left 2011   neg, stereo done by Dr. Evette CristalSankar  . CARDIAC CATHETERIZATION  2000   [facility]  . CATARACT EXTRACTION Right   . CORONARY ARTERY BYPASS GRAFT  2001  . KNEE SURGERY    . thyroid  1970  . UPPER GI ENDOSCOPY  2014    Family History  Problem Relation Age of Onset  . Heart failure Mother 7276  . Heart failure Sister 7348  . Heart attack Sister   . Heart attack Brother 942    Social History Social History  Substance Use Topics  . Smoking status: Never Smoker  . Smokeless tobacco: Never Used  . Alcohol use No    Allergies  Allergen Reactions  . Motrin [Ibuprofen] Rash    No current facility-administered medications for this visit.    No current outpatient prescriptions on file.   Facility-Administered Medications Ordered in Other Visits  Medication Dose Route Frequency Provider Last Rate Last Dose  .  ceFAZolin (ANCEF) 2-4 GM/100ML-% IVPB           . ceFAZolin (ANCEF) IVPB 2g/100 mL premix  2 g Intravenous On Call to OR Seeplaputhur Wynona LunaG Sankar, MD      . Chlorhexidine Gluconate Cloth 2 % PADS 6 each  6 each Topical Once Seeplaputhur Wynona LunaG Sankar, MD      . famotidine (PEPCID) 20 MG tablet           . lactated ringers infusion   Intravenous Continuous Amy Penwarden, MD 75 mL/hr at 04/25/16 0756      Review of Systems Review of Systems  Constitutional: Negative.   Respiratory: Negative.   Cardiovascular: Negative.   Gastrointestinal: Positive for abdominal pain, nausea and vomiting.    Blood pressure (!) 144/80, pulse 72, resp. rate 16, height 5\' 1"  (1.549 m), weight 166 lb (75.3 kg).  Physical Exam Physical Exam  Constitutional: She is oriented to person, place, and time. She appears well-developed and well-nourished.  HENT:  Mouth/Throat: Oropharynx is clear and moist.  Eyes: Conjunctivae are normal. No scleral icterus.  Neck: Neck supple.  Cardiovascular: Normal rate, regular rhythm and normal heart sounds.   Pulmonary/Chest: Effort normal and breath sounds normal.  Abdominal: Soft. Bowel sounds are normal. There is no tenderness.  Lymphadenopathy:  She has no cervical adenopathy.  Neurological: She is alert and oriented to person, place, and time.  Skin: Skin is warm and dry.  Psychiatric: Her behavior is normal.    Data Reviewed Progress notes, CT scan and ultrsound. US- gallstones, multiple 1 to 1.5cm size LFTs were normal Assessment      Gall stones, symptomatic    Plan   Recommended cholecystectomy.  Laparoscopic Cholecystectomy with Intraoperative Cholangiogram. The procedure, including it's potential risks and complications (including but not limited to infection, bleeding, injury to intra-abdominal organs or bile ducts, bile leak, poor cosmetic result, sepsis and death) were discussed with the patient in detail. Non-operative options, including their inherent  risks (acute calculous cholecystitis with possible choledocholithiasis or gallstone pancreatitis, with the risk of ascending cholangitis, sepsis, and death) were discussed as well. The patient expressed and understanding of what we discussed and wishes to proceed with laparoscopic cholecystectomy. The patient further understands that if it is technically not possible, or it is unsafe to proceed laparoscopically, that I will convert to an open cholecystectomy.  The patient is scheduled [facility] on 04[facility]. She will pre admit at the hospital on 04/17/16. She will be seen by her PCP, Dr Beverely RisenFozia Khan on 04/12/16 and will see if she is willing to clear her for surgery. The patient is aware of date and instructions.       This information has been scribed by Dorathy DaftMarsha Hatch RN, BSN,BC.  SANKAR,SEEPLAPUTHUR G 04/25/2016, 8:14 AM

## 2016-04-10 ENCOUNTER — Encounter: Payer: Self-pay | Admitting: General Surgery

## 2016-04-11 DIAGNOSIS — Z01818 Encounter for other preprocedural examination: Secondary | ICD-10-CM | POA: Diagnosis not present

## 2016-04-11 DIAGNOSIS — N182 Chronic kidney disease, stage 2 (mild): Secondary | ICD-10-CM | POA: Diagnosis not present

## 2016-04-11 DIAGNOSIS — R945 Abnormal results of liver function studies: Secondary | ICD-10-CM | POA: Diagnosis not present

## 2016-04-11 DIAGNOSIS — I10 Essential (primary) hypertension: Secondary | ICD-10-CM | POA: Diagnosis not present

## 2016-04-11 DIAGNOSIS — I2584 Coronary atherosclerosis due to calcified coronary lesion: Secondary | ICD-10-CM | POA: Diagnosis not present

## 2016-04-17 ENCOUNTER — Encounter
Admission: RE | Admit: 2016-04-17 | Discharge: 2016-04-17 | Disposition: A | Discharge status: Home / Self Care | Payer: Medicare Other | Source: Ambulatory Visit | Attending: General Surgery | Admitting: General Surgery

## 2016-04-17 DIAGNOSIS — I10 Essential (primary) hypertension: Secondary | ICD-10-CM | POA: Insufficient documentation

## 2016-04-17 DIAGNOSIS — Z79899 Other long term (current) drug therapy: Secondary | ICD-10-CM | POA: Insufficient documentation

## 2016-04-17 DIAGNOSIS — E785 Hyperlipidemia, unspecified: Secondary | ICD-10-CM | POA: Insufficient documentation

## 2016-04-17 DIAGNOSIS — Z01818 Encounter for other preprocedural examination: Secondary | ICD-10-CM | POA: Insufficient documentation

## 2016-04-17 DIAGNOSIS — K219 Gastro-esophageal reflux disease without esophagitis: Secondary | ICD-10-CM | POA: Insufficient documentation

## 2016-04-17 DIAGNOSIS — I454 Nonspecific intraventricular block: Secondary | ICD-10-CM | POA: Insufficient documentation

## 2016-04-17 DIAGNOSIS — R112 Nausea with vomiting, unspecified: Secondary | ICD-10-CM | POA: Insufficient documentation

## 2016-04-17 DIAGNOSIS — K8080 Other cholelithiasis without obstruction: Secondary | ICD-10-CM | POA: Insufficient documentation

## 2016-04-17 DIAGNOSIS — I2510 Atherosclerotic heart disease of native coronary artery without angina pectoris: Secondary | ICD-10-CM | POA: Insufficient documentation

## 2016-04-17 DIAGNOSIS — E039 Hypothyroidism, unspecified: Secondary | ICD-10-CM | POA: Insufficient documentation

## 2016-04-17 HISTORY — DX: Hypothyroidism, unspecified: E03.9

## 2016-04-17 HISTORY — DX: Angina pectoris, unspecified: I20.9

## 2016-04-17 NOTE — Patient Instructions (Signed)
  Your procedure is scheduled on:04/25/16 Report to Day Surgery. [facility] SECOND FLOOR To find out your arrival time please call [phone] between 1PM - 3PM on 04/24/16  Remember: Instructions that are not followed completely may result in serious medical risk, up to and including death, or upon the discretion of your surgeon and anesthesiologist your surgery may need to be rescheduled.    _X___ 1. Do not eat food or drink liquids after midnight. No gum chewing or hard candies.     _X___ 2. No Alcohol for 24 hours before or after surgery.   ____ 3. Do Not Smoke For 24 Hours Prior to Your Surgery.   _X2___ 4. Bring all medications with you on the day of surgery if instructed.    ____ 5. Notify your doctor if there is any change in your medical condition     (cold, fever, infections).       Do not wear jewelry, make-up, hairpins, clips or nail polish.  Do not wear lotions, powders, or perfumes. You may wear deodorant.  Do not shave 48 hours prior to surgery. Men may shave face and neck.  Do not bring valuables to the hospital.    [facility] is not responsible for any belongings or valuables.               Contacts, dentures or bridgework may not be worn into surgery.  Leave your suitcase in the car. After surgery it may be brought to your room.  For patients admitted to the hospital, discharge time is determined by your                treatment team.   Patients discharged the day of surgery will not be allowed to drive home.      __X__ Take these medicines the morning of surgery with A SIP OF WATER:    1. LEVOTHYROXINE  2. RANITIDINE  3. HYDRALAZINE  4.  5.  6.  ____ Fleet Enema (as directed)   _X___ Use CHG Soap as directed  ____ Use inhalers on the day of surgery  ____ Stop metformin 2 days prior to surgery    ____ Take 1/2 of usual insulin dose the night before surgery and none on the morning of surgery.   _X___ Stop Coumadin/Plavix/aspirin on    STOP  ASPIRIN 1 WEEK BEFORE SURGERY  ____ Stop Anti-inflammatories on   ____ Stop supplements until after surgery.    ____ Bring C-Pap to the hospital.

## 2016-04-17 NOTE — Pre-Procedure Instructions (Signed)
BP 205/65 AND 205/87 CALLED TO NURSE'S LINE AT [facility]. PATIENT AND DGT GOING TO HER OFFICE ON WAY HOME

## 2016-04-24 NOTE — Pre-Procedure Instructions (Signed)
Several attempts called to Dr. Beverely RisenFozia Khan office requesting medical clearance, and unable to speak with anyone due to staff with patients. Left several messages and faxed request for clearance X 2.

## 2016-04-24 NOTE — Pre-Procedure Instructions (Addendum)
Call from carolyn at dr sankar's office. States dr Evette Cristalsankar ok without clearance note if anesthesia is. Spoke with dr Henrene Hawkingkephart and noted patient seen by dr f Welton Flakeskhan 04/12/16 and was taken on 04/17/16 after preop because bp elevated by her daughter. Dr Henrene Hawkingkephart states bp has to have been addressed by pcp and note needed. Eber Jonesarolyn at dr sankar's notified.  Call from carolyn and f Welton Flakeskhan has cleared,addressed bp and started on new meds. Faxing to [facility]

## 2016-04-24 NOTE — Pre-Procedure Instructions (Signed)
Medical clearance received from Dr. Beverely RisenFozia Khan.

## 2016-04-25 ENCOUNTER — Ambulatory Visit: Payer: Medicare Other

## 2016-04-25 ENCOUNTER — Encounter
Admission: RE | Disposition: A | Discharge status: Home / Self Care | Payer: Self-pay | Source: Ambulatory Visit | Attending: General Surgery

## 2016-04-25 ENCOUNTER — Ambulatory Visit: Payer: Medicare Other | Admitting: Anesthesiology

## 2016-04-25 ENCOUNTER — Ambulatory Visit
Admission: RE | Admit: 2016-04-25 | Discharge: 2016-04-25 | Disposition: A | Discharge status: Home / Self Care | Payer: Medicare Other | Source: Ambulatory Visit | Attending: General Surgery | Admitting: General Surgery

## 2016-04-25 ENCOUNTER — Encounter: Payer: Self-pay | Admitting: Emergency Medicine

## 2016-04-25 DIAGNOSIS — I10 Essential (primary) hypertension: Secondary | ICD-10-CM | POA: Diagnosis not present

## 2016-04-25 DIAGNOSIS — Z7982 Long term (current) use of aspirin: Secondary | ICD-10-CM | POA: Insufficient documentation

## 2016-04-25 DIAGNOSIS — I2510 Atherosclerotic heart disease of native coronary artery without angina pectoris: Secondary | ICD-10-CM | POA: Diagnosis not present

## 2016-04-25 DIAGNOSIS — Z7951 Long term (current) use of inhaled steroids: Secondary | ICD-10-CM | POA: Insufficient documentation

## 2016-04-25 DIAGNOSIS — Z6831 Body mass index (BMI) 31.0-31.9, adult: Secondary | ICD-10-CM | POA: Insufficient documentation

## 2016-04-25 DIAGNOSIS — K219 Gastro-esophageal reflux disease without esophagitis: Secondary | ICD-10-CM | POA: Insufficient documentation

## 2016-04-25 DIAGNOSIS — E669 Obesity, unspecified: Secondary | ICD-10-CM | POA: Diagnosis not present

## 2016-04-25 DIAGNOSIS — K8010 Calculus of gallbladder with chronic cholecystitis without obstruction: Secondary | ICD-10-CM | POA: Diagnosis not present

## 2016-04-25 DIAGNOSIS — E039 Hypothyroidism, unspecified: Secondary | ICD-10-CM | POA: Diagnosis not present

## 2016-04-25 DIAGNOSIS — I25810 Atherosclerosis of coronary artery bypass graft(s) without angina pectoris: Secondary | ICD-10-CM | POA: Diagnosis not present

## 2016-04-25 DIAGNOSIS — Z951 Presence of aortocoronary bypass graft: Secondary | ICD-10-CM | POA: Insufficient documentation

## 2016-04-25 DIAGNOSIS — E785 Hyperlipidemia, unspecified: Secondary | ICD-10-CM | POA: Diagnosis not present

## 2016-04-25 DIAGNOSIS — K8020 Calculus of gallbladder without cholecystitis without obstruction: Secondary | ICD-10-CM

## 2016-04-25 DIAGNOSIS — Z79899 Other long term (current) drug therapy: Secondary | ICD-10-CM | POA: Insufficient documentation

## 2016-04-25 HISTORY — PX: CHOLECYSTECTOMY: SHX55

## 2016-04-25 SURGERY — LAPAROSCOPIC CHOLECYSTECTOMY WITH INTRAOPERATIVE CHOLANGIOGRAM
Anesthesia: General | Wound class: Clean Contaminated

## 2016-04-25 MED ORDER — CEFAZOLIN SODIUM-DEXTROSE 2-4 GM/100ML-% IV SOLN
2.0000 g | INTRAVENOUS | Status: AC
  Administered 2016-04-25: 2 g via INTRAVENOUS

## 2016-04-25 MED ORDER — PHENYLEPHRINE HCL 10 MG/ML IJ SOLN
INTRAMUSCULAR | Status: DC | PRN
  Administered 2016-04-25: 100 ug via INTRAVENOUS

## 2016-04-25 MED ORDER — SODIUM CHLORIDE 0.9 % IJ SOLN
INTRAMUSCULAR | Status: AC
  Filled 2016-04-25: qty 50

## 2016-04-25 MED ORDER — ATROPINE SULFATE 0.4 MG/ML IJ SOLN
INTRAMUSCULAR | Status: DC | PRN
Start: 2016-04-25 — End: 2016-04-25
  Administered 2016-04-25: 0.2 mg via INTRAVENOUS

## 2016-04-25 MED ORDER — FAMOTIDINE 20 MG PO TABS
20.0000 mg | ORAL_TABLET | Freq: Once | ORAL | Status: AC
  Administered 2016-04-25: 20 mg via ORAL

## 2016-04-25 MED ORDER — SODIUM CHLORIDE 0.9 % IJ SOLN
INTRAMUSCULAR | Status: AC
  Filled 2016-04-25: qty 10

## 2016-04-25 MED ORDER — OXYCODONE HCL 5 MG/5ML PO SOLN: 5.0000 mg | Freq: Once | ORAL | Status: DC | PRN

## 2016-04-25 MED ORDER — PROMETHAZINE HCL 25 MG/ML IJ SOLN
6.2500 mg | INTRAMUSCULAR | Status: DC | PRN
  Administered 2016-04-25: 6.25 mg via INTRAVENOUS

## 2016-04-25 MED ORDER — FENTANYL CITRATE (PF) 100 MCG/2ML IJ SOLN
INTRAMUSCULAR | Status: AC
  Administered 2016-04-25: 25 ug via INTRAVENOUS
  Filled 2016-04-25: qty 2

## 2016-04-25 MED ORDER — FAMOTIDINE 20 MG PO TABS
ORAL_TABLET | ORAL | Status: AC
  Filled 2016-04-25: qty 1

## 2016-04-25 MED ORDER — ACETAMINOPHEN 10 MG/ML IV SOLN
INTRAVENOUS | Status: DC | PRN
  Administered 2016-04-25: 1000 mg via INTRAVENOUS

## 2016-04-25 MED ORDER — CEFAZOLIN SODIUM-DEXTROSE 2-4 GM/100ML-% IV SOLN
INTRAVENOUS | Status: AC
  Filled 2016-04-25: qty 100

## 2016-04-25 MED ORDER — HYDROMORPHONE HCL 1 MG/ML IJ SOLN
INTRAMUSCULAR | Status: DC | PRN
  Administered 2016-04-25: .25 mg via INTRAVENOUS

## 2016-04-25 MED ORDER — FENTANYL CITRATE (PF) 100 MCG/2ML IJ SOLN
25.0000 ug | INTRAMUSCULAR | Status: DC | PRN
  Administered 2016-04-25 (×2): 25 ug via INTRAVENOUS

## 2016-04-25 MED ORDER — ONDANSETRON HCL 4 MG/2ML IJ SOLN
INTRAMUSCULAR | Status: DC | PRN
  Administered 2016-04-25: 4 mg via INTRAVENOUS

## 2016-04-25 MED ORDER — ACETAMINOPHEN 10 MG/ML IV SOLN
INTRAVENOUS | Status: AC
  Filled 2016-04-25: qty 100

## 2016-04-25 MED ORDER — FENTANYL CITRATE (PF) 100 MCG/2ML IJ SOLN
INTRAMUSCULAR | Status: DC | PRN
  Administered 2016-04-25 (×2): 25 ug via INTRAVENOUS

## 2016-04-25 MED ORDER — PROMETHAZINE HCL 25 MG/ML IJ SOLN
INTRAMUSCULAR | Status: AC
  Administered 2016-04-25: 6.25 mg via INTRAVENOUS
  Filled 2016-04-25: qty 1

## 2016-04-25 MED ORDER — LIDOCAINE HCL (CARDIAC) 20 MG/ML IV SOLN
INTRAVENOUS | Status: DC | PRN
  Administered 2016-04-25: 40 mg via INTRAVENOUS

## 2016-04-25 MED ORDER — DEXAMETHASONE SODIUM PHOSPHATE 10 MG/ML IJ SOLN
INTRAMUSCULAR | Status: DC | PRN
  Administered 2016-04-25: 4 mg via INTRAVENOUS

## 2016-04-25 MED ORDER — MEPERIDINE HCL 25 MG/ML IJ SOLN: 6.2500 mg | INTRAMUSCULAR | Status: DC | PRN

## 2016-04-25 MED ORDER — LACTATED RINGERS IV SOLN
INTRAVENOUS | Status: DC
  Administered 2016-04-25: 08:00:00 via INTRAVENOUS

## 2016-04-25 MED ORDER — EPHEDRINE SULFATE 50 MG/ML IJ SOLN
INTRAMUSCULAR | Status: DC | PRN
  Administered 2016-04-25: 20 mg via INTRAVENOUS

## 2016-04-25 MED ORDER — OXYCODONE HCL 5 MG PO TABS: 5.0000 mg | ORAL_TABLET | Freq: Once | ORAL | Status: DC | PRN

## 2016-04-25 MED ORDER — GLYCOPYRROLATE 0.2 MG/ML IJ SOLN
INTRAMUSCULAR | Status: DC | PRN
  Administered 2016-04-25: 0.2 mg via INTRAVENOUS

## 2016-04-25 MED ORDER — SODIUM CHLORIDE 0.9 % IV SOLN
INTRAVENOUS | Status: DC | PRN
  Administered 2016-04-25: 16 mL

## 2016-04-25 MED ORDER — CHLORHEXIDINE GLUCONATE CLOTH 2 % EX PADS: 6.0000 | MEDICATED_PAD | Freq: Once | CUTANEOUS | Status: DC

## 2016-04-25 MED ORDER — ROCURONIUM BROMIDE 100 MG/10ML IV SOLN
INTRAVENOUS | Status: DC | PRN
  Administered 2016-04-25: 50 mg via INTRAVENOUS

## 2016-04-25 MED ORDER — SUGAMMADEX SODIUM 500 MG/5ML IV SOLN
INTRAVENOUS | Status: DC | PRN
  Administered 2016-04-25: 300 mg via INTRAVENOUS

## 2016-04-25 MED ORDER — PROPOFOL 10 MG/ML IV BOLUS
INTRAVENOUS | Status: DC | PRN
  Administered 2016-04-25: 100 mg via INTRAVENOUS

## 2016-04-25 MED ORDER — OXYCODONE-ACETAMINOPHEN 5-325 MG PO TABS
1.0000 | ORAL_TABLET | ORAL | 0 refills | Status: DC | PRN
Start: 2016-04-25 — End: 2016-05-02

## 2016-04-25 SURGICAL SUPPLY — 43 items
AGENT HMST MTR 8 SURGIFLO (HEMOSTASIS)
ANCHOR TIS RET SYS 235ML (MISCELLANEOUS) ×1 IMPLANT
APPLICATOR SURGIFLO (MISCELLANEOUS) IMPLANT
APPLIER CLIP LOGIC TI 5 (MISCELLANEOUS) ×3 IMPLANT
APR CLP MED LRG 33X5 (MISCELLANEOUS) ×1
BAG TISS RTRVL C235 10X14 (MISCELLANEOUS)
BLADE SURG 11 STRL SS SAFETY (MISCELLANEOUS) ×3 IMPLANT
CANISTER SUCT 1200ML W/VALVE (MISCELLANEOUS) ×3 IMPLANT
CANNULA DILATOR 10 W/SLV (CANNULA) ×2 IMPLANT
CANNULA DILATOR 10MM W/SLV (CANNULA) ×1
CATH CHOLANG 76X19 KUMAR (CATHETERS) ×3 IMPLANT
CHLORAPREP W/TINT 26ML (MISCELLANEOUS) ×3 IMPLANT
CLOSURE WOUND 1/2 X4 (GAUZE/BANDAGES/DRESSINGS) ×1
DEFOGGER SCOPE WARMER CLEARIFY (MISCELLANEOUS) ×3 IMPLANT
DRAPE C-ARM XRAY 36X54 (DRAPES) ×3 IMPLANT
DRAPE INCISE IOBAN 66X45 STRL (DRAPES) ×3 IMPLANT
DRESSING TELFA 4X3 1S ST N-ADH (GAUZE/BANDAGES/DRESSINGS) ×3 IMPLANT
DRSG TEGADERM 2-3/8X2-3/4 SM (GAUZE/BANDAGES/DRESSINGS) ×12 IMPLANT
ELECT REM PT RETURN 9FT ADLT (ELECTROSURGICAL) ×3
ELECTRODE REM PT RTRN 9FT ADLT (ELECTROSURGICAL) ×1 IMPLANT
GLOVE BIO SURGEON STRL SZ7 (GLOVE) ×11 IMPLANT
GOWN STRL REUS W/ TWL LRG LVL3 (GOWN DISPOSABLE) ×3 IMPLANT
GOWN STRL REUS W/TWL LRG LVL3 (GOWN DISPOSABLE) ×9
GRASPER SUT TROCAR 14GX15 (MISCELLANEOUS) ×3 IMPLANT
HEMOSTAT SURGICEL 2X3 (HEMOSTASIS) IMPLANT
IRRIGATION STRYKERFLOW (MISCELLANEOUS) ×1 IMPLANT
IRRIGATOR STRYKERFLOW (MISCELLANEOUS) ×3
IV LACTATED RINGERS 1000ML (IV SOLUTION) ×3 IMPLANT
KIT RM TURNOVER STRD PROC AR (KITS) ×3 IMPLANT
LABEL OR SOLS (LABEL) ×3 IMPLANT
NDL INSUFF ACCESS 14 VERSASTEP (NEEDLE) ×3 IMPLANT
PACK LAP CHOLECYSTECTOMY (MISCELLANEOUS) ×3 IMPLANT
SCISSORS METZENBAUM CVD 33 (INSTRUMENTS) ×3 IMPLANT
SLEEVE ENDOPATH XCEL 5M (ENDOMECHANICALS) ×6 IMPLANT
SPOGE SURGIFLO 8M (HEMOSTASIS)
SPONGE SURGIFLO 8M (HEMOSTASIS) IMPLANT
STRIP CLOSURE SKIN 1/2X4 (GAUZE/BANDAGES/DRESSINGS) ×2 IMPLANT
SUT VIC AB 0 CT2 27 (SUTURE) ×2 IMPLANT
SUT VIC AB 0 SH 27 (SUTURE) ×3 IMPLANT
SUT VIC AB 4-0 FS2 27 (SUTURE) ×3 IMPLANT
SWABSTK COMLB BENZOIN TINCTURE (MISCELLANEOUS) ×3 IMPLANT
TROCAR XCEL NON-BLD 5MMX100MML (ENDOMECHANICALS) ×3 IMPLANT
TUBING INSUFFLATOR HI FLOW (MISCELLANEOUS) ×3 IMPLANT

## 2016-04-25 NOTE — Op Note (Signed)
Preop diagnosis: Cholelithiasis and chronic cholecystitis  Post op diagnosis: Same  Operation: Laparoscopy cholecystectomy and cholangiogram  Surgeon: Kathreen CosierS. G. Sankar  Assistant:     Anesthesia: Gen.  Complications: None  EBL: About 25 mL  Drains: None  Description: Patient was put to sleep in supine position the operating table and the abdomen prepped and draped as sterile field. Timeout was performed. Port incision was made at the inferior lip of the umbilicus and in the Veress needle with the InnerDyne sleeve position the peritoneal cavity this was verified of the hanging drop method. After pneumoperitoneum was obtained and a 10 mm port was placed in the camera was introduced. Epigastric and 2 lateral 5 mm ports were then placed. The gallbladder was mildly distended and mildly thickened in its wall but had no adhesions or any signs of acute inflammation. With cephalad traction the Hartman's pouch was pulled up and the cystic duct area was then dissected. The duct was then freed. The artery was also identified and this was freed and hemoclipped and cut. Kumar clamp and catheter were positioned and cholangiogram was performed. There was filling of the bile duct with a long cystic duct noted. A few rounded filling defects were noted. Which moves easily and flushed out suggesting that these were air bubbles. There was prompt emptying of the contrast into the duodenum. The catheter was used to decompress the call gallbladder removed. Cystic duct was hemoclipped and cut. The gallbladder was dissected free from its bed using cautery for control of bleeding. The right upper quadrant was irrigated with some saline and all this was suctioned out hemostasis was intact and no evidence of a bile leak noted. The gallbladder was placed in a retrieval bag and brought out through the umbilical port side. In addition multiple tiny stones she had very 2 very large stones measuring over 2 cm. Alvy B[place] deliver this  the skin and the fascial opening was enlarged slightly. Following this the fascial opening of the umbilicus was closed with interrupted figure-of-eight stitches of 0 Vicryl. With pneumoperitoneum reinstituted the right upper quadrant were inspected and there was no bleeding noted. Pneumoperitoneum was released the ports removed. All skin incisions closed with subcuticular 4-0 Vicryl reinforced with Steri-Strips and tincture benzoin. Dressing was with Telfa and Tegaderm, patient was subsequently returned recovery room stable condition

## 2016-04-25 NOTE — Anesthesia Preprocedure Evaluation (Addendum)
Anesthesia Evaluation  Patient identified by MRN, date of birth, ID band Patient awake    Reviewed: Allergy & Precautions, NPO status , Patient's Chart, lab work & pertinent test results  History of Anesthesia Complications Negative for: history of anesthetic complications  Airway Mallampati: III  TM Distance: >3 FB Neck ROM: Full    Dental  (+) Poor Dentition, Missing   Pulmonary neg pulmonary ROS, neg shortness of breath, neg sleep apnea, neg COPD,    breath sounds clear to auscultation- rhonchi (-) wheezing      Cardiovascular Exercise Tolerance: Good hypertension, Pt. on medications (-) angina+ CAD and + CABG (2000)  + dysrhythmias (LBBB)  Rhythm:Regular Rate:Normal - Systolic murmurs and - Diastolic murmurs Echo 10/08/12 - Left ventricle: The cavity size was normal. There was mild concentric hypertrophy. Systolic function was mildly reduced. The estimated ejection fraction was in the range of 45% to 50%. Septal motion consistent with conduction abnormality and post-operative state. Doppler parameters are consistent with abnormal left ventricular relaxation (grade 1 diastolic dysfunction). - Aortic valve: Mild to moderate regurgitation. - Mitral valve: Mild regurgitation. - Right ventricle: Systolic function was normal. - Tricuspid valve: Mild-moderate regurgitation. - Pulmonary arteries: PA peak pressure: 31mm Hg (S).   Neuro/Psych  Headaches, negative psych ROS   GI/Hepatic Neg liver ROS, GERD  ,  Endo/Other  neg diabetesHypothyroidism   Renal/GU negative Renal ROS     Musculoskeletal  (+) Arthritis , Osteoarthritis,    Abdominal (+) + obese,   Peds  Hematology negative hematology ROS (+)   Anesthesia Other Findings Past Medical History: No date: Anginal pain (HCC) No date: Arthritis No date: Bundle branch block, left     Comment: Chronic No date: CAD (coronary artery disease) No date:  GERD (gastroesophageal reflux disease) No date: Heart murmur No date: Hyperlipidemia No date: Hypertension No date: Hypothyroidism No date: Orthostatic headache     Comment: Mild   Reproductive/Obstetrics                             Anesthesia Physical Anesthesia Plan  ASA: III  Anesthesia Plan: General   Post-op Pain Management:    Induction: Intravenous  Airway Management Planned: Oral ETT  Additional Equipment:   Intra-op Plan:   Post-operative Plan: Extubation in OR  Informed Consent: I have reviewed the patients History and Physical, chart, labs and discussed the procedure including the risks, benefits and alternatives for the proposed anesthesia with the patient or authorized representative who has indicated his/her understanding and acceptance.   Dental advisory given  Plan Discussed with: CRNA and Anesthesiologist  Anesthesia Plan Comments:         Anesthesia Quick Evaluation

## 2016-04-25 NOTE — Anesthesia Postprocedure Evaluation (Signed)
Anesthesia Post Note  Patient: Candie EchevariaMargie W Dubach  Procedure(s) Performed: Procedure(s) (LRB): LAPAROSCOPIC CHOLECYSTECTOMY WITH INTRAOPERATIVE CHOLANGIOGRAM (N/A)  Patient location during evaluation: PACU Anesthesia Type: General Level of consciousness: awake and alert and oriented Pain management: pain level controlled Vital Signs Assessment: post-procedure vital signs reviewed and stable Respiratory status: spontaneous breathing, nonlabored ventilation and respiratory function stable Cardiovascular status: blood pressure returned to baseline and stable Postop Assessment: no signs of nausea or vomiting Anesthetic complications: no    Last Vitals:  Vitals:   04/25/16 1026 04/25/16 1041  BP: (!) 150/65 138/63  Pulse: 90 88  Resp: 13 15  Temp:      Last Pain:  Vitals:   04/25/16 1041  TempSrc:   PainSc: 5                  Yaret Hush

## 2016-04-25 NOTE — Discharge Instructions (Signed)

## 2016-04-25 NOTE — Anesthesia Procedure Notes (Signed)
Procedure Name: Intubation Date/Time: 04/25/2016 8:47 AM Performed by: Marlana SalvageJESSUP, Karol Skarzynski Pre-anesthesia Checklist: Patient identified, Emergency Drugs available, Suction available and Patient being monitored Patient Re-evaluated:Patient Re-evaluated prior to inductionOxygen Delivery Method: Circle system utilized Preoxygenation: Pre-oxygenation with 100% oxygen Intubation Type: IV induction Ventilation: Oral airway inserted - appropriate to patient size and Mask ventilation without difficulty Laryngoscope Size: Mac and 3 Grade View: Grade I Tube type: Oral Tube size: 7.0 mm Number of attempts: 1 Airway Equipment and Method: Stylet Placement Confirmation: ETT inserted through vocal cords under direct vision,  positive ETCO2 and breath sounds checked- equal and bilateral Secured at: 22 cm Tube secured with: Tape Dental Injury: Teeth and Oropharynx as per pre-operative assessment  Comments: Blood noted to tongue over left lower tooth after masking.  No cut noted to lip.

## 2016-04-25 NOTE — OR Nursing (Signed)
Awake and alert, denies nausea, sips water and few bites saltine.  Request to go home, advises will take pain med at home if needed.

## 2016-04-25 NOTE — Interval H&P Note (Signed)
History and Physical Interval Note:  04/25/2016 8:15 AM  Kayla [name]  has presented today for surgery, with the diagnosis of cholelithiasis  The various methods of treatment have been discussed with the patient and family. After consideration of risks, benefits and other options for treatment, the patient has consented to  Procedure(s): LAPAROSCOPIC CHOLECYSTECTOMY WITH INTRAOPERATIVE CHOLANGIOGRAM (N/A) as a surgical intervention .  The patient's history has been reviewed, patient examined, no change in status, stable for surgery.  I have reviewed the patient's chart and labs.  Questions were answered to the patient's satisfaction.     SANKAR,SEEPLAPUTHUR G

## 2016-04-25 NOTE — Transfer of Care (Signed)
Immediate Anesthesia Transfer of Care Note  Patient: Kayla EchevariaMargie W [name]  Procedure(s) Performed: Procedure(s): LAPAROSCOPIC CHOLECYSTECTOMY WITH INTRAOPERATIVE CHOLANGIOGRAM (N/A)  Patient Location: PACU  Anesthesia Type:General  Level of Consciousness: responds to stimulation  Airway & Oxygen Therapy: Patient Spontanous Breathing and Patient connected to nasal cannula oxygen  Post-op Assessment: Report given to RN and Post -op Vital signs reviewed and stable  Post vital signs: Reviewed and stable  Last Vitals:  Vitals:   04/25/16 0737 04/25/16 1011  BP: (!) 183/71 (!) 147/70  Pulse: 75 89  Resp: 16 16  Temp: 36.7 C 36.1 C    Last Pain:  Vitals:   04/25/16 1011  TempSrc:   PainSc: Asleep         Complications: No apparent anesthesia complications

## 2016-04-25 NOTE — H&P (View-Only) (Signed)
Patient ID: Kayla [name], female   DOB: 06/30/1927, 82 y.o.   MRN: [id number]  Chief Complaint  Patient presents with  . Cholelithiasis    HPI Kayla [name] is a 82 y.o. female who presents for an evaluation for gallstones. [facility] and [facility]. The pain began last Tuesday. The pain was under her right rib area, lasting all day. She was nauseated. She vomited Tuesday morning. She did go to the ED for further evaluation. She was sick the rest of the week. She got better yesterday. Never had this pain before.  She is here today with her daughter, [name]. I have reviewed the history of present illness with the patient.  HPI  Past Medical History:  Diagnosis Date  . Anginal pain (HCC)   . Arthritis   . Bundle branch block, left    Chronic  . CAD (coronary artery disease)   . GERD (gastroesophageal reflux disease)   . Heart murmur   . Hyperlipidemia   . Hypertension   . Hypothyroidism   . Orthostatic headache    Mild    Past Surgical History:  Procedure Laterality Date  . APPENDECTOMY  1957  . BREAST BIOPSY Left 2011   neg, stereo done by Dr. Evette CristalSankar  . CARDIAC CATHETERIZATION  2000   [facility]  . CATARACT EXTRACTION Right   . CORONARY ARTERY BYPASS GRAFT  2001  . KNEE SURGERY    . thyroid  1970  . UPPER GI ENDOSCOPY  2014    Family History  Problem Relation Age of Onset  . Heart failure Mother 7276  . Heart failure Sister 7348  . Heart attack Sister   . Heart attack Brother 942    Social History Social History  Substance Use Topics  . Smoking status: Never Smoker  . Smokeless tobacco: Never Used  . Alcohol use No    Allergies  Allergen Reactions  . Motrin [Ibuprofen] Rash    No current facility-administered medications for this visit.    No current outpatient prescriptions on file.   Facility-Administered Medications Ordered in Other Visits  Medication Dose Route Frequency Provider Last Rate Last Dose  .  ceFAZolin (ANCEF) 2-4 GM/100ML-% IVPB           . ceFAZolin (ANCEF) IVPB 2g/100 mL premix  2 g Intravenous On Call to OR Seeplaputhur Wynona LunaG Sankar, MD      . Chlorhexidine Gluconate Cloth 2 % PADS 6 each  6 each Topical Once Seeplaputhur Wynona LunaG Sankar, MD      . famotidine (PEPCID) 20 MG tablet           . lactated ringers infusion   Intravenous Continuous Amy Penwarden, MD 75 mL/hr at 04/25/16 0756      Review of Systems Review of Systems  Constitutional: Negative.   Respiratory: Negative.   Cardiovascular: Negative.   Gastrointestinal: Positive for abdominal pain, nausea and vomiting.    Blood pressure (!) 144/80, pulse 72, resp. rate 16, height 5\' 1"  (1.549 m), weight 166 lb (75.3 kg).  Physical Exam Physical Exam  Constitutional: She is oriented to person, place, and time. She appears well-developed and well-nourished.  HENT:  Mouth/Throat: Oropharynx is clear and moist.  Eyes: Conjunctivae are normal. No scleral icterus.  Neck: Neck supple.  Cardiovascular: Normal rate, regular rhythm and normal heart sounds.   Pulmonary/Chest: Effort normal and breath sounds normal.  Abdominal: Soft. Bowel sounds are normal. There is no tenderness.  Lymphadenopathy:  She has no cervical adenopathy.  Neurological: She is alert and oriented to person, place, and time.  Skin: Skin is warm and dry.  Psychiatric: Her behavior is normal.    Data Reviewed Progress notes, CT scan and ultrsound. US- gallstones, multiple 1 to 1.5cm size LFTs were normal Assessment      Gall stones, symptomatic    Plan   Recommended cholecystectomy.  Laparoscopic Cholecystectomy with Intraoperative Cholangiogram. The procedure, including it's potential risks and complications (including but not limited to infection, bleeding, injury to intra-abdominal organs or bile ducts, bile leak, poor cosmetic result, sepsis and death) were discussed with the patient in detail. Non-operative options, including their inherent  risks (acute calculous cholecystitis with possible choledocholithiasis or gallstone pancreatitis, with the risk of ascending cholangitis, sepsis, and death) were discussed as well. The patient expressed and understanding of what we discussed and wishes to proceed with laparoscopic cholecystectomy. The patient further understands that if it is technically not possible, or it is unsafe to proceed laparoscopically, that I will convert to an open cholecystectomy.  The patient is [facility]. She will pre admit at the hospital on 04/17/16. She will be seen by her PCP, Dr Beverely RisenFozia Khan on 04/12/16 and will see if she is willing to clear her for surgery. The patient is aware of date and instructions.       This information has been scribed by Dorathy DaftMarsha Hatch RN, BSN,BC.  SANKAR,SEEPLAPUTHUR G 04/25/2016, 8:14 AM

## 2016-04-26 LAB — SURGICAL PATHOLOGY

## 2016-05-02 ENCOUNTER — Encounter: Payer: Self-pay | Admitting: General Surgery

## 2016-05-02 ENCOUNTER — Ambulatory Visit (INDEPENDENT_AMBULATORY_CARE_PROVIDER_SITE_OTHER): Payer: Medicare Other | Admitting: General Surgery

## 2016-05-02 VITALS — BP 130/74 | HR 76 | Resp 12 | Ht 61.0 in | Wt 162.0 lb

## 2016-05-02 DIAGNOSIS — K8020 Calculus of gallbladder without cholecystitis without obstruction: Secondary | ICD-10-CM

## 2016-05-02 NOTE — Patient Instructions (Signed)
The patient is aware to call back for any questions or concerns.  

## 2016-05-02 NOTE — Progress Notes (Signed)
Patient ID: Kayla [name], female   DOB: 03/09/1927, 82 y.o.   MRN: [id number]  Chief Complaint  Patient presents with  . Routine Post Op    HPI Kayla [name] is a 82 y.o. female.  Here today for postoperative visit, they state they are doing well. Denies any gastrointestinal issues, bowels are moving regular but constipated at times She is here today with her daughter Kayla [name]. I have reviewed the history of present illness with the patient.  HPI  Past Medical History:  Diagnosis Date  . Anginal pain (HCC)   . Arthritis   . Bundle branch block, left    Chronic  . CAD (coronary artery disease)   . GERD (gastroesophageal reflux disease)   . Heart murmur   . Hyperlipidemia   . Hypertension   . Hypothyroidism   . Orthostatic headache    Mild    Past Surgical History:  Procedure Laterality Date  . APPENDECTOMY  1957  . BREAST BIOPSY Left 2011   neg, stereo done by Dr. Evette CristalSankar  . CARDIAC CATHETERIZATION  2000   [facility]  . CATARACT EXTRACTION Right   . CHOLECYSTECTOMY N/A 04/25/2016   Procedure: LAPAROSCOPIC CHOLECYSTECTOMY WITH INTRAOPERATIVE CHOLANGIOGRAM;  Surgeon: Kayla BrightlySeeplaputhur G Britanny Marksberry, MD;  Location: [facility] ORS;  Service: General;  Laterality: N/A;  . CORONARY ARTERY BYPASS GRAFT  2001  . KNEE SURGERY    . thyroid  1970  . UPPER GI ENDOSCOPY  2014    Family History  Problem Relation Age of Onset  . Heart failure Mother 7876  . Heart failure Sister 4048  . Heart attack Sister   . Heart attack Brother 7242    Social History Social History  Substance Use Topics  . Smoking status: Never Smoker  . Smokeless tobacco: Never Used  . Alcohol use No    Allergies  Allergen Reactions  . Motrin [Ibuprofen] Rash    Current Outpatient Prescriptions  Medication Sig Dispense Refill  . amLODipine (NORVASC) 5 MG tablet Take 5 mg by mouth daily.     [name]. aspirin 81 MG tablet Take 81 mg by mouth daily.      . B Complex Vitamins (B COMPLEX-B12) TABS Take 1 tablet by mouth  daily.      . Cholecalciferol (VITAMIN D) 2000 UNITS tablet Take 2,000 Units by mouth daily.    . Cyanocobalamin (VITAMIN B 12 PO) Take 3,000 Units by mouth daily.    . fluticasone (FLONASE) 50 MCG/ACT nasal spray Place 2 sprays into both nostrils daily.    [name]. levothyroxine (SYNTHROID, LEVOTHROID) 88 MCG tablet Take 88 mcg by mouth daily before breakfast.    . losartan-hydrochlorothiazide (HYZAAR) 100-12.5 MG tablet Take 1 tablet by mouth daily.    [name]. lovastatin (MEVACOR) 20 MG tablet Take 20 mg by mouth at bedtime.    . meclizine (ANTIVERT) 12.5 MG tablet Take 12.5 mg by mouth 2 (two) times daily as needed.     [name]. omeprazole (PRILOSEC) 40 MG capsule Take 40 mg by mouth every evening.     . ranitidine (ZANTAC) 150 MG capsule Take 150 mg by mouth daily.       No current facility-administered medications for this visit.     Review of Systems Review of Systems  Constitutional: Negative.   Respiratory: Negative.   Cardiovascular: Negative.   Gastrointestinal: Positive for constipation.    Blood pressure 130/74, pulse 76, resp. rate 12, height 5\' 1"  (1.549 m), weight 162 lb (73.5 kg).  Physical Exam  Physical Exam  Constitutional: She is oriented to person, place, and time. She appears well-developed and well-nourished.  Cardiovascular: Normal rate, regular rhythm and normal heart sounds.   Pulmonary/Chest: Effort normal and breath sounds normal.  Abdominal: Soft. Normal appearance and bowel sounds are normal. There is no tenderness.  Port sites well healed.  Neurological: She is alert and oriented to person, place, and time.  Skin: Skin is warm and dry.  Psychiatric: Her behavior is normal.    Data Reviewed Progress notes Path_ cholelithiasis and chronic cholecystitis Assessment    Stable postoperative exam.    Plan    Follow up as needed. No restrictions on activity The patient is aware to call back for any questions or concerns.      This information has been scribed by  Dorathy Daft RN, BSN,BC.   Vannessa Godown G 05/02/2016, 1:44 PM

## 2016-05-10 ENCOUNTER — Encounter: Payer: Self-pay | Admitting: General Surgery

## 2016-08-08 ENCOUNTER — Encounter: Payer: Self-pay | Admitting: Family

## 2016-08-08 DIAGNOSIS — I6529 Occlusion and stenosis of unspecified carotid artery: Secondary | ICD-10-CM | POA: Insufficient documentation

## 2016-08-08 DIAGNOSIS — E039 Hypothyroidism, unspecified: Secondary | ICD-10-CM | POA: Insufficient documentation

## 2016-08-08 DIAGNOSIS — N189 Chronic kidney disease, unspecified: Secondary | ICD-10-CM | POA: Insufficient documentation

## 2016-08-08 DIAGNOSIS — F32A Depression, unspecified: Secondary | ICD-10-CM | POA: Insufficient documentation

## 2016-08-08 DIAGNOSIS — F329 Major depressive disorder, single episode, unspecified: Secondary | ICD-10-CM | POA: Insufficient documentation

## 2016-08-08 NOTE — Assessment & Plan Note (Addendum)
HCTZ stopped by prior MD due to elevation in Cr/BUN. Increased hydralazine to 10mg  TID at last visit with prior MD. Pending CMP.

## 2016-08-08 NOTE — Progress Notes (Signed)
Subjective:    Patient ID: Kayla [name], female    DOB: [birth year], 83 y.o.   MRN: [id number]  CC: Kayla [name] is a 83 y.o. female who presents today to establish care.    HPI: Records received from [facility]. Kayla [name].   Had seen orthopedic for injections in left shoulder and hip and would like referral today. Told she needed right knee replacement years ago and right knee pain worsening. Using aleve and blue deep icy hot with some relief. No falls.   Recent gallballder removed. No RUQ pain.   Follows with Kayla [name]. HTN generally well controlled 130/70. Denies exertional chest pain or pressure, numbness or tingling radiating to left arm or jaw, palpitations, dizziness, frequent headaches, changes in vision, or shortness of breath.   GERD- controlled with zantac in the  Morning and prilosec in the afternoon. Greasy foods trigger. No hoarseness, trouble with pills, cough. No recent egd.  AR- no dizziness, lightheaded.   Other:   dexa ( last 2011) , carotid US ( last 2010),colonoscopy ( last 2004). No longer doing colonoscopy. Continues with mammogram and needs new referral     HISTORY:  Past Medical History:  Diagnosis Date  . Anginal pain (HCC)   . Arthritis   . Bundle branch block, left    Chronic  . CAD (coronary artery disease)   . Cholecystitis    from record  . Emphysema lung (HCC)   . GERD (gastroesophageal reflux disease)   . Heart murmur   . Hyperlipidemia   . Hypertension   . Hypothyroidism   . Orthostatic headache    Mild  . Vertigo    Past Surgical History:  Procedure Laterality Date  . APPENDECTOMY  1957  . BREAST BIOPSY Left 2011   neg, stereo done by Kayla. Evette [name]  . CARDIAC CATHETERIZATION  2000   [facility]  . CATARACT EXTRACTION Right   . CHOLECYSTECTOMY N/A 04/25/2016   Procedure: LAPAROSCOPIC CHOLECYSTECTOMY WITH INTRAOPERATIVE CHOLANGIOGRAM;  Surgeon: Kayla Brightly, MD;  Location: [facility] ORS;  Service: General;  Laterality:  N/A;  . CORONARY ARTERY BYPASS GRAFT  2001  . KNEE SURGERY    . thyroid  1970  . UPPER GI ENDOSCOPY  2014   Family History  Problem Relation Age of Onset  . Heart failure Mother 31  . Heart failure Sister 75  . Heart attack Sister   . Heart attack Brother 42    Allergies: Motrin [ibuprofen] Current Outpatient Prescriptions on File Prior to Visit  Medication Sig Dispense Refill  . amLODipine (NORVASC) 5 MG tablet Take 5 mg by mouth daily.     [name] aspirin 81 MG tablet Take 81 mg by mouth daily.      . B Complex Vitamins (B COMPLEX-B12) TABS Take 1 tablet by mouth daily.      . Cholecalciferol (VITAMIN D) 2000 UNITS tablet Take 2,000 Units by mouth daily.    . Cyanocobalamin (VITAMIN B 12 PO) Take 3,000 Units by mouth daily.    . fluticasone (FLONASE) 50 MCG/ACT nasal spray Place 2 sprays into both nostrils daily.    [name] levothyroxine (SYNTHROID, LEVOTHROID) 88 MCG tablet Take 88 mcg by mouth daily before breakfast.    . losartan-hydrochlorothiazide (HYZAAR) 100-12.5 MG tablet Take 1 tablet by mouth daily.    [name] lovastatin (MEVACOR) 20 MG tablet Take 20 mg by mouth at bedtime.    . meclizine (ANTIVERT) 12.5 MG tablet Take 12.5 mg by mouth 2 (two) times  daily as needed.     [name]. omeprazole (PRILOSEC) 40 MG capsule Take 40 mg by mouth every evening.     . ranitidine (ZANTAC) 150 MG capsule Take 150 mg by mouth daily.       No current facility-administered medications on file prior to visit.     Social History  Substance Use Topics  . Smoking status: Never Smoker  . Smokeless tobacco: Never Used  . Alcohol use No    Review of Systems  Constitutional: Negative for chills and fever.  Respiratory: Negative for cough, shortness of breath and wheezing.   Cardiovascular: Positive for leg swelling (chronic). Negative for chest pain and palpitations.  Gastrointestinal: Negative for abdominal pain, nausea and vomiting.  Musculoskeletal: Negative for joint swelling.      Objective:    BP  (!) 145/80   Pulse 70   Temp 97.6 F (36.4 C) (Oral)   Ht 5\' 1"  (1.549 m)   Wt 166 lb 9.6 oz (75.6 kg)   SpO2 99%   BMI 31.48 kg/m  BP Readings from Last 3 Encounters:  08/09/16 (!) 145/80  05/02/16 130/74  04/25/16 (!) 131/55   Wt Readings from Last 3 Encounters:  08/09/16 166 lb 9.6 oz (75.6 kg)  05/02/16 162 lb (73.5 kg)  04/25/16 165 lb (74.8 kg)    Physical Exam  Constitutional: She appears well-developed and well-nourished.  Eyes: Conjunctivae are normal.  Cardiovascular: Normal rate, regular rhythm and normal pulses.   Murmur heard.  Systolic murmur is present with a grade of 2/6  +1 nonpitting LE edema to ankle. No palpable cords or masses. No erythema or increased warmth. No asymmetry in calf size when compared bilaterally LE hair growth symmetric and present. LE warm and palpable pedal pulses. SEM II/VI, Loudest LSB, non radiating, no thrill    Pulmonary/Chest: Effort normal and breath sounds normal. She has no wheezes. She has no rhonchi. She has no rales.  Neurological: She is alert.  Skin: Skin is warm and dry.  Psychiatric: She has a normal mood and affect. Her speech is normal and behavior is normal. Thought content normal.  Vitals reviewed.      Assessment & Plan:   Problem List Items Addressed This Visit      Cardiovascular and Mediastinum   Essential hypertension - Primary    Controlled on current regimen. Pending CMP.         Genitourinary   Chronic kidney disease    HCTZ stopped by prior MD due to elevation in Cr/BUN. Increased hydralazine to 10mg  TID at last visit with prior MD. Pending CMP.       Relevant Orders   Comprehensive metabolic panel     Other   Chronic pain of right knee    Chronic. Worsening. Referral placed for consult- possible injection v surgery      Relevant Orders   Ambulatory referral to Orthopedic Surgery   Screening for breast cancer    Order placed.       Relevant Orders   MM DIGITAL SCREENING BILATERAL        I am having Kayla [name] maintain her aspirin, B Complex-B12, ranitidine, amLODipine, Cyanocobalamin (VITAMIN B 12 PO), Vitamin D, levothyroxine, meclizine, lovastatin, omeprazole, fluticasone, and losartan-hydrochlorothiazide.   No orders of the defined types were placed in this encounter.   Return precautions given.   Risks, benefits, and alternatives of the medications and treatment plan prescribed today were discussed, and patient expressed understanding.   Education regarding symptom  management and diagnosis given to patient on AVS.  Continue to follow with Rennie Plowman, FNP for routine health maintenance.   Kayla [name] and I agreed with plan.   Rennie Plowman, FNP

## 2016-08-09 ENCOUNTER — Ambulatory Visit (INDEPENDENT_AMBULATORY_CARE_PROVIDER_SITE_OTHER): Payer: Medicare Other | Admitting: Family

## 2016-08-09 ENCOUNTER — Encounter: Payer: Self-pay | Admitting: Family

## 2016-08-09 VITALS — BP 145/80 | HR 70 | Temp 97.6°F | Ht 61.0 in | Wt 166.6 lb

## 2016-08-09 DIAGNOSIS — I10 Essential (primary) hypertension: Secondary | ICD-10-CM | POA: Diagnosis not present

## 2016-08-09 DIAGNOSIS — Z1231 Encounter for screening mammogram for malignant neoplasm of breast: Secondary | ICD-10-CM

## 2016-08-09 DIAGNOSIS — N189 Chronic kidney disease, unspecified: Secondary | ICD-10-CM

## 2016-08-09 DIAGNOSIS — M25561 Pain in right knee: Secondary | ICD-10-CM | POA: Diagnosis not present

## 2016-08-09 DIAGNOSIS — G8929 Other chronic pain: Secondary | ICD-10-CM

## 2016-08-09 DIAGNOSIS — Z1239 Encounter for other screening for malignant neoplasm of breast: Secondary | ICD-10-CM | POA: Insufficient documentation

## 2016-08-09 LAB — COMPREHENSIVE METABOLIC PANEL
ALT: 11 U/L (ref 0–35)
AST: 14 U/L (ref 0–37)
Albumin: 4.3 g/dL (ref 3.5–5.2)
Alkaline Phosphatase: 60 U/L (ref 39–117)
BUN: 12 mg/dL (ref 6–23)
CHLORIDE: 95 meq/L — AB (ref 96–112)
CO2: 29 meq/L (ref 19–32)
CREATININE: 1.06 mg/dL (ref 0.40–1.20)
Calcium: 9.3 mg/dL (ref 8.4–10.5)
GFR: 52.08 mL/min — ABNORMAL LOW (ref >60.00)
Glucose, Bld: 100 mg/dL — ABNORMAL HIGH (ref 70–99)
Potassium: 4.3 mEq/L (ref 3.5–5.1)
SODIUM: 130 meq/L — AB (ref 135–145)
Total Bilirubin: 0.7 mg/dL (ref 0.2–1.2)
Total Protein: 6.8 g/dL (ref 6.0–8.3)

## 2016-08-09 NOTE — Progress Notes (Signed)
Pre visit review using our clinic review tool, if applicable. No additional management support is needed unless otherwise documented below in the visit note. 

## 2016-08-09 NOTE — Assessment & Plan Note (Signed)
Chronic. Worsening. Referral placed for consult- possible injection v surgery

## 2016-08-09 NOTE — Patient Instructions (Signed)
My pleasure meeting you  Referral to orthopedics  Follow up one month  Labs today

## 2016-08-09 NOTE — Assessment & Plan Note (Signed)
Order placed

## 2016-08-09 NOTE — Assessment & Plan Note (Signed)
Controlled on current regimen. Pending CMP. 

## 2016-08-10 ENCOUNTER — Other Ambulatory Visit: Payer: Self-pay | Admitting: Family

## 2016-08-10 DIAGNOSIS — E871 Hypo-osmolality and hyponatremia: Secondary | ICD-10-CM

## 2016-08-14 ENCOUNTER — Other Ambulatory Visit (INDEPENDENT_AMBULATORY_CARE_PROVIDER_SITE_OTHER): Payer: Medicare Other

## 2016-08-14 DIAGNOSIS — E871 Hypo-osmolality and hyponatremia: Secondary | ICD-10-CM

## 2016-08-14 LAB — BASIC METABOLIC PANEL
BUN: 10 mg/dL (ref 6–23)
CALCIUM: 9.3 mg/dL (ref 8.4–10.5)
CHLORIDE: 91 meq/L — AB (ref 96–112)
CO2: 27 mEq/L (ref 19–32)
CREATININE: 1.07 mg/dL (ref 0.40–1.20)
GFR: 51.52 mL/min — ABNORMAL LOW (ref >60.00)
Glucose, Bld: 110 mg/dL — ABNORMAL HIGH (ref 70–99)
Potassium: 4.1 mEq/L (ref 3.5–5.1)
Sodium: 127 mEq/L — ABNORMAL LOW (ref 135–145)

## 2016-08-15 ENCOUNTER — Telehealth: Payer: Self-pay | Admitting: Family

## 2016-08-15 ENCOUNTER — Other Ambulatory Visit: Payer: Self-pay | Admitting: Family

## 2016-08-15 DIAGNOSIS — I10 Essential (primary) hypertension: Secondary | ICD-10-CM

## 2016-08-15 MED ORDER — AMLODIPINE BESYLATE 10 MG PO TABS: 10.0000 mg | ORAL_TABLET | Freq: Every day | ORAL | 0 refills | Status: DC

## 2016-08-15 MED ORDER — LOSARTAN POTASSIUM 100 MG PO TABS: 100.0000 mg | ORAL_TABLET | Freq: Every day | ORAL | 1 refills | Status: DC

## 2016-08-15 NOTE — Telephone Encounter (Signed)
Called patient to discuss low sodium  Hold hyzaar until she hears from me.   BP have been running 150/90.   NO confusion. Feels well.

## 2016-08-16 ENCOUNTER — Other Ambulatory Visit: Payer: Self-pay | Admitting: Radiology

## 2016-08-16 ENCOUNTER — Telehealth: Payer: Self-pay | Admitting: Radiology

## 2016-08-16 ENCOUNTER — Other Ambulatory Visit: Payer: Self-pay | Admitting: Family

## 2016-08-16 DIAGNOSIS — E871 Hypo-osmolality and hyponatremia: Secondary | ICD-10-CM

## 2016-08-16 NOTE — Telephone Encounter (Signed)
Pt coming in for labs Friday, please place orders. Thank you.  

## 2016-08-17 NOTE — Progress Notes (Signed)
Left VM for patient to return call back. 

## 2016-08-17 NOTE — Progress Notes (Signed)
Patient has been informed.

## 2016-08-18 ENCOUNTER — Other Ambulatory Visit (INDEPENDENT_AMBULATORY_CARE_PROVIDER_SITE_OTHER): Payer: Medicare Other

## 2016-08-18 DIAGNOSIS — E871 Hypo-osmolality and hyponatremia: Secondary | ICD-10-CM | POA: Diagnosis not present

## 2016-08-18 LAB — BASIC METABOLIC PANEL
BUN: 14 mg/dL (ref 6–23)
CHLORIDE: 95 meq/L — AB (ref 96–112)
CO2: 28 mEq/L (ref 19–32)
Calcium: 9.2 mg/dL (ref 8.4–10.5)
Creatinine, Ser: 1.11 mg/dL (ref 0.40–1.20)
GFR: 49.38 mL/min — AB (ref >60.00)
Glucose, Bld: 97 mg/dL (ref 70–99)
POTASSIUM: 4.6 meq/L (ref 3.5–5.1)
Sodium: 130 mEq/L — ABNORMAL LOW (ref 135–145)

## 2016-08-21 ENCOUNTER — Ambulatory Visit (INDEPENDENT_AMBULATORY_CARE_PROVIDER_SITE_OTHER): Payer: Medicare Other | Admitting: Family

## 2016-08-21 ENCOUNTER — Encounter: Payer: Self-pay | Admitting: Family

## 2016-08-21 VITALS — BP 142/70 | HR 75 | Temp 97.7°F | Ht 61.0 in | Wt 168.4 lb

## 2016-08-21 DIAGNOSIS — I10 Essential (primary) hypertension: Secondary | ICD-10-CM | POA: Diagnosis not present

## 2016-08-21 NOTE — Progress Notes (Signed)
Subjective:    Patient ID: Kayla [name], female    DOB: [birth year], 83 y.o.   MRN: [id number]  CC: Kayla [name] is a 83 y.o. female who presents today for follow up.   HPI: HTN- Denies exertional chest pain or pressure, numbness or tingling radiating to left arm or jaw, palpitations, dizziness, frequent headaches, changes in vision, or shortness of breath.   Range from 140/68- 175/81 at home. Cuff calibrated today in our office  Chronic LE swelling; hasn't worsened on 10mg  amlodipine.         HISTORY:  Past Medical History:  Diagnosis Date  . Anginal pain (HCC)   . Arthritis   . Bundle branch block, left    Chronic  . CAD (coronary artery disease)   . Cholecystitis    from record  . Emphysema lung (HCC)   . GERD (gastroesophageal reflux disease)   . Heart murmur   . Hyperlipidemia   . Hypertension   . Hypothyroidism   . Orthostatic headache    Mild  . Vertigo    Past Surgical History:  Procedure Laterality Date  . APPENDECTOMY  1957  . BREAST BIOPSY Left 2011   neg, stereo done by Dr. Evette Cristal  . CARDIAC CATHETERIZATION  2000   [facility]  . CATARACT EXTRACTION Right   . CHOLECYSTECTOMY N/A 04/25/2016   Procedure: LAPAROSCOPIC CHOLECYSTECTOMY WITH INTRAOPERATIVE CHOLANGIOGRAM;  Surgeon: Kieth Brightly, MD;  Location: [facility] ORS;  Service: General;  Laterality: N/A;  . CORONARY ARTERY BYPASS GRAFT  2001  . KNEE SURGERY    . thyroid  1970  . UPPER GI ENDOSCOPY  2014   Family History  Problem Relation Age of Onset  . Heart failure Mother 67  . Heart failure Sister 36  . Heart attack Sister   . Heart attack Brother 42    Allergies: Motrin [ibuprofen] Current Outpatient Prescriptions on File Prior to Visit  Medication Sig Dispense Refill  . amLODipine (NORVASC) 10 MG tablet Take 1 tablet (10 mg total) by mouth daily. 90 tablet 0  . aspirin 81 MG tablet Take 81 mg by mouth daily.      . B Complex Vitamins (B COMPLEX-B12) TABS Take 1 tablet by mouth  daily.      . Cholecalciferol (VITAMIN D) 2000 UNITS tablet Take 2,000 Units by mouth daily.    . Cyanocobalamin (VITAMIN B 12 PO) Take 3,000 Units by mouth daily.    . fluticasone (FLONASE) 50 MCG/ACT nasal spray Place 2 sprays into both nostrils daily.    [name] levothyroxine (SYNTHROID, LEVOTHROID) 88 MCG tablet Take 88 mcg by mouth daily before breakfast.    . losartan (COZAAR) 100 MG tablet Take 1 tablet (100 mg total) by mouth daily. 90 tablet 1  . lovastatin (MEVACOR) 20 MG tablet Take 20 mg by mouth at bedtime.    . meclizine (ANTIVERT) 12.5 MG tablet Take 12.5 mg by mouth 2 (two) times daily as needed.     [name] omeprazole (PRILOSEC) 40 MG capsule Take 40 mg by mouth every evening.     . ranitidine (ZANTAC) 150 MG capsule Take 150 mg by mouth daily.       No current facility-administered medications on file prior to visit.     Social History  Substance Use Topics  . Smoking status: Never Smoker  . Smokeless tobacco: Never Used  . Alcohol use No    Review of Systems  Constitutional: Negative for chills and fever.  Respiratory: Negative for  cough, shortness of breath and wheezing.   Cardiovascular: Positive for leg swelling (chronic). Negative for chest pain and palpitations.  Gastrointestinal: Negative for nausea and vomiting.      Objective:    BP (!) 142/70   Pulse 75   Temp 97.7 F (36.5 C) (Oral)   Ht 5\' 1"  (1.549 m)   Wt 168 lb 6.4 oz (76.4 kg)   SpO2 99%   BMI 31.82 kg/m  BP Readings from Last 3 Encounters:  08/21/16 (!) 142/70  08/09/16 (!) 145/80  05/02/16 130/74   Wt Readings from Last 3 Encounters:  08/21/16 168 lb 6.4 oz (76.4 kg)  08/09/16 166 lb 9.6 oz (75.6 kg)  05/02/16 162 lb (73.5 kg)    Physical Exam  Constitutional: She appears well-developed and well-nourished.  Eyes: Conjunctivae are normal.  Cardiovascular: Normal rate, regular rhythm and normal pulses.   Murmur heard.  Systolic murmur is present with a grade of 2/6  SEM II/VI, Loudest LSB,  non radiating, no thrill   Pulmonary/Chest: Effort normal and breath sounds normal. She has no wheezes. She has no rhonchi. She has no rales.  Neurological: She is alert.  Skin: Skin is warm and dry.  Psychiatric: She has a normal mood and affect. Her speech is normal and behavior is normal. Thought content normal.  Vitals reviewed.      Assessment & Plan:   Problem List Items Addressed This Visit      Cardiovascular and Mediastinum   Essential hypertension - Primary    Fluctuating; highest at home SBP 170s. Calibrated today. Home BP cuff not appropriate size for arm; advised to get larger cuff.  No CP, SOB. Off hyzaar at this time, pending repeat of BMP to see if sodium normalized and hyzaar to blame. Max dose of losartan and amlodipine. Would consider beta blocker however hesitation with h/o LBBB. Messaged cardiologist who she also sees 2/16 for advice. Will follow.       Relevant Orders   Basic metabolic panel       I am having Ms. Mcdougald maintain her aspirin, B Complex-B12, ranitidine, Cyanocobalamin (VITAMIN B 12 PO), Vitamin D, levothyroxine, meclizine, lovastatin, omeprazole, fluticasone, amLODipine, and losartan.   No orders of the defined types were placed in this encounter.   Return precautions given.   Risks, benefits, and alternatives of the medications and treatment plan prescribed today were discussed, and patient expressed understanding.   Education regarding symptom management and diagnosis given to patient on AVS.  Continue to follow with Rennie PlowmanMargaret Arnett, FNP for routine health maintenance.   Kayla EchevariaMargie W Cryderman and I agreed with plan.   Rennie PlowmanMargaret Arnett, FNP

## 2016-08-21 NOTE — Patient Instructions (Addendum)
Be sure you are on these when you get home-  Amlodipine 10mg  every day Losartan 100mg  daily  When you see Dr Mariah MillingGollan, I have messaged him and would like to know safe medication to ADD to your regimen.   Lab appt Thursday morning- make an appt at check out

## 2016-08-21 NOTE — Assessment & Plan Note (Signed)
Fluctuating; highest at home SBP 170s. Calibrated today. Home BP cuff not appropriate size for arm; advised to get larger cuff.  No CP, SOB. Off hyzaar at this time, pending repeat of BMP to see if sodium normalized and hyzaar to blame. Max dose of losartan and amlodipine. Would consider beta blocker however hesitation with h/o LBBB. Messaged cardiologist who she also sees 2/16 for advice. Will follow.

## 2016-08-24 ENCOUNTER — Other Ambulatory Visit (INDEPENDENT_AMBULATORY_CARE_PROVIDER_SITE_OTHER): Payer: Medicare Other

## 2016-08-24 DIAGNOSIS — I10 Essential (primary) hypertension: Secondary | ICD-10-CM

## 2016-08-24 LAB — BASIC METABOLIC PANEL
BUN: 18 mg/dL (ref 6–23)
CALCIUM: 9 mg/dL (ref 8.4–10.5)
CO2: 26 mEq/L (ref 19–32)
CREATININE: 1.12 mg/dL (ref 0.40–1.20)
Chloride: 98 mEq/L (ref 96–112)
GFR: 48.87 mL/min — AB (ref >60.00)
Glucose, Bld: 102 mg/dL — ABNORMAL HIGH (ref 70–99)
Potassium: 4.5 mEq/L (ref 3.5–5.1)
Sodium: 133 mEq/L — ABNORMAL LOW (ref 135–145)

## 2016-08-29 ENCOUNTER — Ambulatory Visit (INDEPENDENT_AMBULATORY_CARE_PROVIDER_SITE_OTHER): Payer: Medicare Other

## 2016-08-29 VITALS — BP 136/70 | HR 87 | Resp 14 | Ht 61.0 in | Wt 163.1 lb

## 2016-08-29 DIAGNOSIS — Z0000 Encounter for general adult medical examination without abnormal findings: Secondary | ICD-10-CM

## 2016-08-29 NOTE — Patient Instructions (Addendum)
  Kayla [name] , Thank you for taking time to come for your Medicare Wellness Visit. I appreciate your ongoing commitment to your health goals. Please review the following plan we discussed and let me know if I can assist you in the future.   These are the goals we discussed: Goals    . Increase water intake          Stay hydrated and drink plenty of water       This is a list of the screening recommended for you and due dates:  Health Maintenance  Topic Date Due  . Tetanus Vaccine  04/21/1948  . Shingles Vaccine  04/21/1989  . Pneumonia vaccines (2 of 2 - PPSV23) 04/27/2017  . Flu Shot  Completed  . DEXA scan (bone density measurement)  Completed

## 2016-08-29 NOTE — Progress Notes (Signed)
Subjective:   Kayla [name] is a 83 y.o. female who presents for an Initial Medicare Annual Wellness Visit.  Review of Systems    No ROS.  Medicare Wellness Visit.  Cardiac Risk Factors include: advanced age (>355men, 35>65 women);hypertension;obesity (BMI >30kg/m2)     Objective:    Today's Vitals   08/29/16 1116  BP: 136/70  Pulse: 87  Resp: 14  SpO2: 99%  Weight: 163 lb 1.9 oz (74 kg)  Height: 5\' 1"  (1.549 m)   Body mass index is 30.82 kg/m.   Current Medications (verified) Outpatient Encounter Prescriptions as of 08/29/2016  Medication Sig  . amLODipine (NORVASC) 10 MG tablet Take 1 tablet (10 mg total) by mouth daily.  [name]. aspirin 81 MG tablet Take 81 mg by mouth daily.    . B Complex Vitamins (B COMPLEX-B12) TABS Take 1 tablet by mouth daily.    . Cholecalciferol (VITAMIN D) 2000 UNITS tablet Take 2,000 Units by mouth daily.  . Cyanocobalamin (VITAMIN B 12 PO) Take 3,000 Units by mouth daily.  . fluticasone (FLONASE) 50 MCG/ACT nasal spray Place 2 sprays into both nostrils daily.  [name]. levothyroxine (SYNTHROID, LEVOTHROID) 88 MCG tablet Take 88 mcg by mouth daily before breakfast.  . losartan (COZAAR) 100 MG tablet Take 1 tablet (100 mg total) by mouth daily.  [name]. lovastatin (MEVACOR) 20 MG tablet Take 20 mg by mouth at bedtime.  . meclizine (ANTIVERT) 12.5 MG tablet Take 12.5 mg by mouth 2 (two) times daily as needed.   [name]. omeprazole (PRILOSEC) 40 MG capsule Take 40 mg by mouth every evening.   . ranitidine (ZANTAC) 150 MG capsule Take 150 mg by mouth daily.     No facility-administered encounter medications on file as of 08/29/2016.     Allergies (verified) Motrin [ibuprofen]   History: Past Medical History:  Diagnosis Date  . Anginal pain (HCC)   . Arthritis   . Bundle branch block, left    Chronic  . CAD (coronary artery disease)   . Cholecystitis    from record  . Emphysema lung (HCC)   . GERD (gastroesophageal reflux disease)   . Heart murmur   .  Hyperlipidemia   . Hypertension   . Hypothyroidism   . Orthostatic headache    Mild  . Vertigo    Past Surgical History:  Procedure Laterality Date  . APPENDECTOMY  1957  . BREAST BIOPSY Left 2011   neg, stereo done by Dr. Evette CristalSankar  . CARDIAC CATHETERIZATION  2000   [facility]  . CATARACT EXTRACTION Right   . CHOLECYSTECTOMY N/A 04/25/2016   Procedure: LAPAROSCOPIC CHOLECYSTECTOMY WITH INTRAOPERATIVE CHOLANGIOGRAM;  Surgeon: Kieth BrightlySeeplaputhur G Sankar, MD;  Location: [facility] ORS;  Service: General;  Laterality: N/A;  . CORONARY ARTERY BYPASS GRAFT  2001  . KNEE SURGERY    . thyroid  1970  . UPPER GI ENDOSCOPY  2014   Family History  Problem Relation Age of Onset  . Heart failure Mother 4776  . Heart failure Sister 7148  . Heart attack Sister   . Heart attack Brother 1842   Social History   Occupational History  . Part time    Social History Main Topics  . Smoking status: Never Smoker  . Smokeless tobacco: Never Used  . Alcohol use No  . Drug use: No  . Sexual activity: No    Tobacco Counseling Counseling given: Not Answered   Activities of Daily Living In your present state of health, do you have any  difficulty performing the following activities: 08/29/2016 04/17/2016  Hearing? Malvin Johns  Vision? N N  Difficulty concentrating or making decisions? N N  Walking or climbing stairs? Y Y  Dressing or bathing? N N  Doing errands, shopping? Y -  Quarry manager and eating ? N -  Using the Toilet? N -  In the past six months, have you accidently leaked urine? N -  Do you have problems with loss of bowel control? N -  Managing your Medications? N -  Managing your Finances? N -  Housekeeping or managing your Housekeeping? N -  Some recent data might be hidden    Immunizations and Health Maintenance Immunization History  Administered Date(s) Administered  . Influenza-Unspecified 04/17/2016  . Pneumococcal Polysaccharide-23 04/27/2016   Health Maintenance Due  Topic Date Due  .  TETANUS/TDAP  04/21/1948  . ZOSTAVAX  04/21/1989    Patient Care Team: Allegra Grana, FNP as PCP - General (Family Medicine) Lyndon Code, MD (Internal Medicine) Seeplaputhur Wynona Luna, MD (General Surgery)  Indicate any recent Medical Services you may have received from other than [facility] providers in the past year (date may be approximate).     Assessment:   This is a routine wellness examination for Kayla [name]. The goal of the wellness visit is to assist the patient how to close the gaps in care and create a preventative care plan for the patient.   Taking calcium VIT D as appropriate/Osteoporosis risk reviewed.  Medications reviewed; taking without issues or barriers.  Safety issues reviewed; smoke detectors in the home. No firearms in the home. Wears seatbelts when driving or riding with others. No violence in the home.  No identified risk were noted; The patient was oriented x 3; appropriate in dress and manner and no objective failures at ADL's or IADL's.   BMI; discussed the importance of a healthy diet, water intake and exercise. Educational material provided.  HTN; followed by PCP.  TDAP and ZOSTAVAX vaccine deferred per patient request for follow up with her insurance.  Patient Concerns: None at this time. Follow up with PCP as needed.  Hearing/Vision screen Hearing Screening Comments: Followed by Dr. Jenne Campus Difficulty hearing a whisper Does not wear hearing aids Audiologic testing deferred in the last year per patient preference Vision Screening Comments: Followed by [facility] Reports macular degeneration Wears corrective lenses when reading Visual acuity not assessed per patient preference since she has regular follow up with her ophthalmologist  Dietary issues and exercise activities discussed: Current Exercise Habits: Home exercise routine, Type of exercise: treadmill;calisthenics, Time (Minutes): 60, Frequency (Times/Week): 2, Weekly Exercise  (Minutes/Week): 120, Intensity: Mild  Goals    . Increase water intake          Stay hydrated and drink plenty of water      Depression Screen PHQ 2/9 Scores 08/09/2016  PHQ - 2 Score 0    Fall Risk Fall Risk  08/09/2016  Falls in the past year? No    Cognitive Function: MMSE - Mini Mental State Exam 08/29/2016  Orientation to time 5  Orientation to Place 5  Registration 3  Attention/ Calculation 5  Recall 3  Language- name 2 objects 2  Language- repeat 1  Language- follow 3 step command 3  Language- read & follow direction 1  Write a sentence 1  Copy design 1  Total score 30        Screening Tests Health Maintenance  Topic Date Due  . TETANUS/TDAP  04/21/1948  . ZOSTAVAX  04/21/1989  . PNA vac Low Risk Adult (2 of 2 - PPSV23) 04/27/2017  . INFLUENZA VACCINE  Completed  . DEXA SCAN  Completed      Plan:    End of life planning; Advance aging; Advanced directives discussed. No HCPOA/Living Will.  Additional information provided to help her start the conversation with her family.  Copy requested of HCPOA/Living Will short forms upon completion.  Time spent on this topic is 26 minutes.  Medicare Attestation I have personally reviewed: The patient's medical and social history Their use of alcohol, tobacco or illicit drugs Their current medications and supplements The patient's functional ability including ADLs,fall risks, home safety risks, cognitive, and hearing and visual impairment Diet and physical activities Evidence for depression   The patient's weight, height, BMI, and visual acuity have been recorded in the chart.  I have made referrals and provided education to the patient based on review of the above and I have provided the patient with a written personalized care plan for preventive services.    During the course of the visit, Venba was educated and counseled about the following appropriate screening and preventive services:   Vaccines to  include Pneumoccal, Influenza, Hepatitis B, Td, Zostavax, HCV  Electrocardiogram  Cardiovascular disease screening  Colorectal cancer screening  Bone density screening  Diabetes screening  Glaucoma screening  Mammography/PAP  Nutrition counseling  Smoking cessation counseling  Patient Instructions (the written plan) were given to the patient.    Ashok Pall, LPN   [id number]   Agree with above plan. Rennie Plowman, NP

## 2016-08-31 ENCOUNTER — Telehealth: Payer: Self-pay | Admitting: Family

## 2016-08-31 DIAGNOSIS — I10 Essential (primary) hypertension: Secondary | ICD-10-CM

## 2016-08-31 MED ORDER — CARVEDILOL 6.25 MG PO TABS: ORAL_TABLET | ORAL | 0 refills | Status: DC

## 2016-08-31 NOTE — Telephone Encounter (Signed)
When you call pt-  Please emphasize that we would SLOWLY move to whole tablet.   If may be a few weeks- certainly don't her to feel dizzy, tired.

## 2016-08-31 NOTE — Telephone Encounter (Signed)
-----   Message from Antonieta Ibaimothy J Gollan, MD sent at 08/31/2016 11:36 PM EST ----- She would be ok on B-blocker We could try coreg 3.125 with titration upwards  Maybe start with 6.25 mg pill and she could cut in half, slowly increase Other options: clonidine, imdur, cardura, hydralazine thx TGollan  ----- Message ----- From: Allegra GranaMargaret G Arnett, FNP Sent: 08/21/2016   2:48 PM To: Antonieta Ibaimothy J Gollan, MD  Dr. Mariah MillingGollan, I dced Hyzaar due to hyponatremia. With her LBB, can I add a beta blocker? Any suggestion for add on? Unable to get blood pressure under better control. Thanks for input!

## 2016-08-31 NOTE — Telephone Encounter (Signed)
-----   Message from Timothy J Gollan, MD sent at 08/31/2016 11:36 PM EST ----- She would be ok on B-blocker We could try coreg 3.125 with titration upwards  Maybe start with 6.25 mg pill and she could cut in half, slowly increase Other options: clonidine, imdur, cardura, hydralazine thx TGollan  ----- Message ----- From: Eliab Closson G Analyse Angst, FNP Sent: 08/21/2016   2:48 PM To: Timothy J Gollan, MD  Dr. Gollan, I dced Hyzaar due to hyponatremia. With her LBB, can I add a beta blocker? Any suggestion for add on? Unable to get blood pressure under better control. Thanks for input!  

## 2016-08-31 NOTE — Telephone Encounter (Signed)
Call pt  Spoke with Dr. Mariah MillingGollan  Let's start coreg.   We will start with HALF tablet of 6.25 once per day. If blood pressure still running above 150/90, I would like her to take WHOLE tablet once per day.

## 2016-09-01 NOTE — Telephone Encounter (Signed)
Patient stated she would like to discuss it with Dr. Lewie LoronGolan on Monday when she goes into appointment so she is not confused. Patient stated she will bring her medication in during appointment.

## 2016-09-04 ENCOUNTER — Encounter: Payer: Self-pay | Admitting: Cardiovascular Disease

## 2016-09-04 ENCOUNTER — Ambulatory Visit (INDEPENDENT_AMBULATORY_CARE_PROVIDER_SITE_OTHER): Payer: Medicare Other | Admitting: Cardiovascular Disease

## 2016-09-04 VITALS — BP 150/68 | HR 69 | Ht 61.0 in | Wt 161.8 lb

## 2016-09-04 DIAGNOSIS — I10 Essential (primary) hypertension: Secondary | ICD-10-CM

## 2016-09-04 DIAGNOSIS — I6521 Occlusion and stenosis of right carotid artery: Secondary | ICD-10-CM

## 2016-09-04 DIAGNOSIS — E782 Mixed hyperlipidemia: Secondary | ICD-10-CM

## 2016-09-04 DIAGNOSIS — I25718 Atherosclerosis of autologous vein coronary artery bypass graft(s) with other forms of angina pectoris: Secondary | ICD-10-CM | POA: Diagnosis not present

## 2016-09-04 DIAGNOSIS — I350 Nonrheumatic aortic (valve) stenosis: Secondary | ICD-10-CM

## 2016-09-04 MED ORDER — CARVEDILOL 6.25 MG PO TABS: ORAL_TABLET | ORAL | 6 refills | Status: DC

## 2016-09-04 NOTE — Progress Notes (Signed)
Cardiology Office Note  Date:  09/04/2016   ID:  Kayla [name], DOB 1926-10-12, MRN [id number]  PCP:  Rennie Plowman, FNP   Chief Complaint  Patient presents with  . other    30mo f/u. PT c/o sob. Reviewed meds with pt verbally.    HPI:  Kayla [name] is an 83 y/o woman with h/o CAD s/p CABG in 2001 at [facility], LBBB, hyperlipidemia, GERD and HTN.  Myoview in 2009 was normal. Here for routine f/u Of her coronary artery disease and hypertension Mild aortic valve stenosis History of PVCs  In follow-up today, recent medication changes made through primary care Amlodipine was increased up to 10 mg,  She reports that this has not changed leg edema  Stopped HCTZ, sodium low Prescription sent in for carvedilol. She Has not started coreg yet Numbers that she provides today blood pressure 140 up to 160 systolic Otherwise she reports that she feels well  Last carotid u/s in 2015 Carotid ultrasound done at outside facility showing 60% right carotid stenosis, minimal on the left  Denies any chest symptoms concerning for angina  total cholesterol 126, March 2017  EKG on today's visit shows normal sinus rhythm with rate 69 bpm, left bundle branch block,   Other past medical history Previous admission to the hospital for chest discomfort. Rule out and was sent home Previous GERD symptoms, takes proton pump inhibitor  PMH:   has a past medical history of Anginal pain (HCC); Arthritis; Bundle branch block, left; CAD (coronary artery disease); Cholecystitis; Emphysema lung (HCC); GERD (gastroesophageal reflux disease); Heart murmur; Hyperlipidemia; Hypertension; Hypothyroidism; Orthostatic headache; and Vertigo.  PSH:    Past Surgical History:  Procedure Laterality Date  . APPENDECTOMY  1957  . BREAST BIOPSY Left 2011   neg, stereo done by Dr. Evette Cristal  . CARDIAC CATHETERIZATION  2000   [facility]  . CATARACT EXTRACTION Right   . CHOLECYSTECTOMY N/A 04/25/2016   Procedure: LAPAROSCOPIC  CHOLECYSTECTOMY WITH INTRAOPERATIVE CHOLANGIOGRAM;  Surgeon: Kieth Brightly, MD;  Location: [facility] ORS;  Service: General;  Laterality: N/A;  . CORONARY ARTERY BYPASS GRAFT  2001  . KNEE SURGERY    . thyroid  1970  . UPPER GI ENDOSCOPY  2014    Current Outpatient Prescriptions  Medication Sig Dispense Refill  . amLODipine (NORVASC) 10 MG tablet Take 1 tablet (10 mg total) by mouth daily. 90 tablet 0  . aspirin 81 MG tablet Take 81 mg by mouth daily.      . B Complex Vitamins (B COMPLEX-B12) TABS Take 1 tablet by mouth daily.      . carvedilol (COREG) 6.25 MG tablet One pill twice a day 60 tablet 6  . Cholecalciferol (VITAMIN D) 2000 UNITS tablet Take 2,000 Units by mouth daily.    . Cyanocobalamin (VITAMIN B 12 PO) Take 3,000 Units by mouth daily.    . fluticasone (FLONASE) 50 MCG/ACT nasal spray Place 2 sprays into both nostrils daily.    [name] levothyroxine (SYNTHROID, LEVOTHROID) 88 MCG tablet Take 88 mcg by mouth daily before breakfast.    . losartan (COZAAR) 100 MG tablet Take 1 tablet (100 mg total) by mouth daily. 90 tablet 1  . lovastatin (MEVACOR) 20 MG tablet Take 20 mg by mouth at bedtime.    . meclizine (ANTIVERT) 12.5 MG tablet Take 12.5 mg by mouth 2 (two) times daily as needed.     [name] omeprazole (PRILOSEC) 40 MG capsule Take 40 mg by mouth every evening.     [name]  ranitidine (ZANTAC) 150 MG capsule Take 150 mg by mouth daily.       No current facility-administered medications for this visit.      Allergies:   Motrin [ibuprofen]   Social History:  The patient  reports that she has never smoked. She has never used smokeless tobacco. She reports that she does not drink alcohol or use drugs.   Family History:   family history includes Heart attack in her sister; Heart attack (age of onset: 5342) in her brother; Heart failure (age of onset: 1048) in her sister; Heart failure (age of onset: 1376) in her mother.    Review of Systems: Review of Systems  Constitutional: Negative.    Respiratory: Negative.   Cardiovascular: Negative.   Gastrointestinal: Negative.   Musculoskeletal: Negative.   Neurological: Negative.   Psychiatric/Behavioral: Negative.   All other systems reviewed and are negative.    PHYSICAL EXAM: VS:  BP (!) 150/68 (BP Location: Left Arm, Patient Position: Sitting, Cuff Size: Normal)   Pulse 69   Ht 5\' 1"  (1.549 m)   Wt 161 lb 12 oz (73.4 kg)   BMI 30.56 kg/m  , BMI Body mass index is 30.56 kg/m. GEN: Well nourished, well developed, in no acute distress  HEENT: normal  Neck: no JVD, carotid bruits, or masses Cardiac: RRR; II/VI SEM RSB,  No rubs, or gallops,no edema  Respiratory:  clear to auscultation bilaterally, normal work of breathing GI: soft, nontender, nondistended, + BS MS: no deformity or atrophy  Skin: warm and dry, no rash Neuro:  Strength and sensation are intact Psych: euthymic mood, full affect    Recent Labs: 03/28/2016: Hemoglobin 13.2; Platelets 183 08/09/2016: ALT 11 08/24/2016: BUN 18; Creatinine, Ser 1.12; Potassium 4.5; Sodium 133    Lipid Panel No results found for: CHOL, HDL, LDLCALC, TRIG    Wt Readings from Last 3 Encounters:  09/04/16 161 lb 12 oz (73.4 kg)  08/29/16 163 lb 1.9 oz (74 kg)  08/21/16 168 lb 6.4 oz (76.4 kg)       ASSESSMENT AND PLAN:  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 No further workup at this time. Continue current medication regimen.  Essential hypertension - Plan: EKG 12-Lead, carvedilol (COREG) 6.25 MG tablet Recommended she start carvedilol 3.125 mg twice a day with titration up to 6.25 mg twice a day if tolerated. She'll continue to monitor blood pressure. Denies worsening of leg swelling on high-dose amlodipine  Aortic valve stenosis, etiology of cardiac valve disease unspecified - Plan: EKG 12-Lead Mild aortic valve stenosis, murmur on  exam  Carotid stenosis, right Previous carotid ultrasound results discussed with her Repeat carotid ultrasound ordered, last study more than 2 years ago  Hyperlipidemia Cholesterol is at goal on the current lipid regimen. No changes to the medications were made.   Total encounter time more than 25 minutes  Greater than 50% was spent in counseling and coordination of care with the patient   Disposition:   F/U  6 months   Orders Placed This Encounter  Procedures  . EKG 12-Lead     Signed, Dossie Arbourim Gollan, M.D., Ph.D. 09/04/2016  [facility], [place] [phone]

## 2016-09-04 NOTE — Telephone Encounter (Signed)
noted 

## 2016-09-04 NOTE — Patient Instructions (Addendum)
Medication Instructions:   Please start coreg/carvedilol 1/2 pill twice as day If blood pressure continues to run high (150 to 160 on the top), Increase up to a full pill twice a day  Labwork:  No new labs needed  Testing/Procedures:  We will order a carotid u/s for carotid stenosis on the right   I recommend watching educational videos on topics of interest to you at:       www.goemmi.com  Enter code: HEARTCARE    Follow-Up: It was a pleasure seeing you in the office today. Please call us if you have new issues that need to be addressed before your next appt.  [phone]  Your physician wants you to follow-up in: 6 months.  You will receive a reminder letter in the mail two months in advance. If you don't receive a letter, please call our office to schedule the follow-up appointment.  If you need a refill on your cardiac medications before your next appointment, please call your pharmacy.

## 2016-09-06 ENCOUNTER — Other Ambulatory Visit: Payer: Self-pay

## 2016-09-06 ENCOUNTER — Other Ambulatory Visit: Payer: Self-pay | Admitting: Cardiovascular Disease

## 2016-09-06 DIAGNOSIS — I10 Essential (primary) hypertension: Secondary | ICD-10-CM

## 2016-09-06 DIAGNOSIS — I6521 Occlusion and stenosis of right carotid artery: Secondary | ICD-10-CM

## 2016-09-06 MED ORDER — LOVASTATIN 20 MG PO TABS: 20.0000 mg | ORAL_TABLET | Freq: Every day | ORAL | 1 refills | Status: DC

## 2016-09-06 MED ORDER — AMLODIPINE BESYLATE 10 MG PO TABS: 10.0000 mg | ORAL_TABLET | Freq: Every day | ORAL | 1 refills | Status: DC

## 2016-09-06 MED ORDER — RANITIDINE HCL 150 MG PO CAPS: 150.0000 mg | ORAL_CAPSULE | Freq: Every day | ORAL | 1 refills | Status: DC

## 2016-09-06 MED ORDER — LOSARTAN POTASSIUM 100 MG PO TABS: 100.0000 mg | ORAL_TABLET | Freq: Every day | ORAL | 1 refills | Status: DC

## 2016-09-06 MED ORDER — LEVOTHYROXINE SODIUM 88 MCG PO TABS: 88.0000 ug | ORAL_TABLET | Freq: Every day | ORAL | 1 refills | Status: DC

## 2016-09-06 NOTE — Telephone Encounter (Signed)
Medication has been refilled.

## 2016-09-12 ENCOUNTER — Encounter: Payer: Self-pay | Admitting: Family

## 2016-09-12 ENCOUNTER — Ambulatory Visit (INDEPENDENT_AMBULATORY_CARE_PROVIDER_SITE_OTHER): Payer: Medicare Other | Admitting: Family

## 2016-09-12 VITALS — BP 128/68 | HR 66 | Temp 97.6°F | Ht 61.0 in | Wt 161.8 lb

## 2016-09-12 DIAGNOSIS — I10 Essential (primary) hypertension: Secondary | ICD-10-CM | POA: Diagnosis not present

## 2016-09-12 DIAGNOSIS — E039 Hypothyroidism, unspecified: Secondary | ICD-10-CM

## 2016-09-12 LAB — TSH: TSH: 4.43 u[IU]/mL (ref 0.35–4.50)

## 2016-09-12 LAB — LIPID PANEL
CHOL/HDL RATIO: 2
Cholesterol: 124 mg/dL (ref 0–200)
HDL: 53 mg/dL (ref >39.00)
LDL CALC: 55 mg/dL (ref 0–99)
NONHDL: 71.27
Triglycerides: 82 mg/dL (ref 0.0–149.0)
VLDL: 16.4 mg/dL (ref 0.0–40.0)

## 2016-09-12 LAB — T4, FREE: Free T4: 1.3 ng/dL (ref 0.60–1.60)

## 2016-09-12 LAB — BASIC METABOLIC PANEL
BUN: 11 mg/dL (ref 6–23)
CALCIUM: 9.2 mg/dL (ref 8.4–10.5)
CHLORIDE: 97 meq/L (ref 96–112)
CO2: 27 meq/L (ref 19–32)
Creatinine, Ser: 1.08 mg/dL (ref 0.40–1.20)
GFR: 50.96 mL/min — ABNORMAL LOW (ref >60.00)
Glucose, Bld: 100 mg/dL — ABNORMAL HIGH (ref 70–99)
Potassium: 4.7 mEq/L (ref 3.5–5.1)
SODIUM: 131 meq/L — AB (ref 135–145)

## 2016-09-12 MED ORDER — OMEPRAZOLE 40 MG PO CPDR: 40.0000 mg | DELAYED_RELEASE_CAPSULE | Freq: Every evening | ORAL | 1 refills | Status: DC

## 2016-09-12 NOTE — Progress Notes (Signed)
Subjective:    Patient ID: Kayla [name], female    DOB: Mar 19, 1927, 83 y.o.   MRN: [id number]  CC: Kayla [name] is a 83 y.o. female who presents today for follow up.   HPI: Doing well today.   HTN- Gollan started her on carvedilol. Doesn't check BP at home. Denies exertional chest pain or pressure, numbness or tingling radiating to left arm or jaw, palpitations, dizziness, frequent headaches, changes in vision, or shortness of breath.   Hypothyroidism- no symptoms. Compliant with medication.    mammogram scheduled  Doesn't want to do colonoscopy at her age however will do cologuard for screening.          HISTORY:  Past Medical History:  Diagnosis Date  . Anginal pain (HCC)   . Arthritis   . Bundle branch block, left    Chronic  . CAD (coronary artery disease)   . Cholecystitis    from record  . Emphysema lung (HCC)   . GERD (gastroesophageal reflux disease)   . Heart murmur   . Hyperlipidemia   . Hypertension   . Hypothyroidism   . Orthostatic headache    Mild  . Vertigo    Past Surgical History:  Procedure Laterality Date  . APPENDECTOMY  1957  . BREAST BIOPSY Left 2011   neg, stereo done by Dr. Evette Cristal  . CARDIAC CATHETERIZATION  2000   [facility]  . CATARACT EXTRACTION Right   . CHOLECYSTECTOMY N/A 04/25/2016   Procedure: LAPAROSCOPIC CHOLECYSTECTOMY WITH INTRAOPERATIVE CHOLANGIOGRAM;  Surgeon: Kieth Brightly, MD;  Location: [facility] ORS;  Service: General;  Laterality: N/A;  . CORONARY ARTERY BYPASS GRAFT  2001  . KNEE SURGERY    . thyroid  1970  . UPPER GI ENDOSCOPY  2014   Family History  Problem Relation Age of Onset  . Heart failure Mother 22  . Heart failure Sister 42  . Heart attack Sister   . Heart attack Brother 42    Allergies: Motrin [ibuprofen] Current Outpatient Prescriptions on File Prior to Visit  Medication Sig Dispense Refill  . amLODipine (NORVASC) 10 MG tablet Take 1 tablet (10 mg total) by mouth daily. 90 tablet 1  .  aspirin 81 MG tablet Take 81 mg by mouth daily.      . B Complex Vitamins (B COMPLEX-B12) TABS Take 1 tablet by mouth daily.      . carvedilol (COREG) 6.25 MG tablet One pill twice a day 60 tablet 6  . Cholecalciferol (VITAMIN D) 2000 UNITS tablet Take 2,000 Units by mouth daily.    . Cyanocobalamin (VITAMIN B 12 PO) Take 3,000 Units by mouth daily.    . fluticasone (FLONASE) 50 MCG/ACT nasal spray Place 2 sprays into both nostrils daily.    [name] levothyroxine (SYNTHROID, LEVOTHROID) 88 MCG tablet Take 1 tablet (88 mcg total) by mouth daily before breakfast. 90 tablet 1  . losartan (COZAAR) 100 MG tablet Take 1 tablet (100 mg total) by mouth daily. 90 tablet 1  . lovastatin (MEVACOR) 20 MG tablet Take 1 tablet (20 mg total) by mouth at bedtime. 90 tablet 1  . meclizine (ANTIVERT) 12.5 MG tablet Take 12.5 mg by mouth 2 (two) times daily as needed.     . ranitidine (ZANTAC) 150 MG capsule Take 1 capsule (150 mg total) by mouth daily. 90 capsule 1   No current facility-administered medications on file prior to visit.     Social History  Substance Use Topics  . Smoking status: Never  Smoker  . Smokeless tobacco: Never Used  . Alcohol use No    Review of Systems  Constitutional: Negative for chills and fever.  Respiratory: Negative for cough.   Cardiovascular: Negative for chest pain and palpitations.  Gastrointestinal: Negative for nausea and vomiting.      Objective:    BP 128/68   Pulse 66   Temp 97.6 F (36.4 C) (Oral)   Ht 5\' 1"  (1.549 m)   Wt 161 lb 12.8 oz (73.4 kg)   SpO2 98%   BMI 30.57 kg/m  BP Readings from Last 3 Encounters:  09/12/16 128/68  09/04/16 (!) 150/68  08/29/16 136/70   Wt Readings from Last 3 Encounters:  09/12/16 161 lb 12.8 oz (73.4 kg)  09/04/16 161 lb 12 oz (73.4 kg)  08/29/16 163 lb 1.9 oz (74 kg)    Physical Exam  Constitutional: She appears well-developed and well-nourished.  Eyes: Conjunctivae are normal.  Cardiovascular: Normal rate,  regular rhythm, normal heart sounds and normal pulses.   Pulmonary/Chest: Effort normal and breath sounds normal. She has no wheezes. She has no rhonchi. She has no rales.  Neurological: She is alert.  Skin: Skin is warm and dry.  Psychiatric: She has a normal mood and affect. Her speech is normal and behavior is normal. Thought content normal.  Vitals reviewed.      Assessment & Plan:   Problem List Items Addressed This Visit      Cardiovascular and Mediastinum   Essential hypertension - Primary    Well controlled. Carvedilol added to regimen. No side effects at this time. Will follow. Continues to follow with Gollan as well.       Relevant Orders   Basic metabolic panel (Completed)   Lipid panel (Completed)     Endocrine   Hypothyroidism    Asymptomatic. Pending labs.       Relevant Orders   TSH (Completed)   T4, free (Completed)       I have changed Kayla [name]'s omeprazole. I am also having her maintain her aspirin, B Complex-B12, Cyanocobalamin (VITAMIN B 12 PO), Vitamin D, meclizine, fluticasone, carvedilol, ranitidine, lovastatin, amLODipine, losartan, and levothyroxine.   Meds ordered this encounter  Medications  . omeprazole (PRILOSEC) 40 MG capsule    Sig: Take 1 capsule (40 mg total) by mouth every evening.    Dispense:  90 capsule    Refill:  1    Return precautions given.   Risks, benefits, and alternatives of the medications and treatment plan prescribed today were discussed, and patient expressed understanding.   Education regarding symptom management and diagnosis given to patient on AVS.  Continue to follow with Rennie PlowmanMargaret Peggy Monk, FNP for routine health maintenance.   Kayla EchevariaMargie W [name] and I agreed with plan.   Rennie PlowmanMargaret Kayla Merendino, FNP

## 2016-09-12 NOTE — Progress Notes (Signed)
Pre visit review using our clinic review tool, if applicable. No additional management support is needed unless otherwise documented below in the visit note. 

## 2016-09-12 NOTE — Patient Instructions (Addendum)
Monitor BP- let me know if you feel tired, dizzy  Great to see you as always  Labs today

## 2016-09-13 NOTE — Assessment & Plan Note (Signed)
Asymptomatic. Pending labs.

## 2016-09-13 NOTE — Assessment & Plan Note (Signed)
Well controlled. Carvedilol added to regimen. No side effects at this time. Will follow. Continues to follow with Gollan as well.

## 2016-09-20 ENCOUNTER — Telehealth: Payer: Self-pay | Admitting: *Deleted

## 2016-09-20 DIAGNOSIS — K219 Gastro-esophageal reflux disease without esophagitis: Secondary | ICD-10-CM

## 2016-09-20 NOTE — Telephone Encounter (Signed)
[facility] has requested a new Rx for ranitidine, insurance will not cover capsules, it has to be written as tablets.  Fax [phone]

## 2016-09-21 ENCOUNTER — Other Ambulatory Visit: Payer: Self-pay | Admitting: Family

## 2016-09-21 DIAGNOSIS — E871 Hypo-osmolality and hyponatremia: Secondary | ICD-10-CM | POA: Insufficient documentation

## 2016-09-21 MED ORDER — RANITIDINE HCL 150 MG PO TABS: 150.0000 mg | ORAL_TABLET | Freq: Two times a day (BID) | ORAL | 1 refills | Status: DC

## 2016-09-21 NOTE — Telephone Encounter (Signed)
Patient has been notified

## 2016-09-21 NOTE — Telephone Encounter (Signed)
Call pt  Zantac sent to [facility]

## 2016-10-02 ENCOUNTER — Ambulatory Visit: Payer: Medicare Other

## 2016-10-02 DIAGNOSIS — I6521 Occlusion and stenosis of right carotid artery: Secondary | ICD-10-CM

## 2016-10-02 LAB — VAS US CAROTID
LEFT ECA DIAS: 0 cm/s
LEFT VERTEBRAL DIAS: -8 cm/s
Left CCA dist dias: -14 cm/s
Left CCA dist sys: -80 cm/s
Left CCA prox dias: 0 cm/s
Left CCA prox sys: 73 cm/s
Left ICA dist dias: 18 cm/s
Left ICA dist sys: 85 cm/s
Left ICA prox dias: -17 cm/s
Left ICA prox sys: -82 cm/s
RIGHT ECA DIAS: 6 cm/s
RIGHT VERTEBRAL DIAS: -9 cm/s
Right CCA prox dias: 0 cm/s
Right CCA prox sys: 118 cm/s
Right cca dist sys: 93 cm/s

## 2016-10-04 ENCOUNTER — Telehealth: Payer: Self-pay | Admitting: Family

## 2016-10-04 NOTE — Telephone Encounter (Signed)
The patient is needing a refill on her carvedilol (COREG) 6.25 MG tablet

## 2016-10-04 NOTE — Telephone Encounter (Signed)
Too soon for refills.

## 2016-10-10 ENCOUNTER — Other Ambulatory Visit (INDEPENDENT_AMBULATORY_CARE_PROVIDER_SITE_OTHER): Payer: Medicare Other

## 2016-10-10 DIAGNOSIS — E871 Hypo-osmolality and hyponatremia: Secondary | ICD-10-CM | POA: Diagnosis not present

## 2016-10-10 LAB — BASIC METABOLIC PANEL
BUN: 17 mg/dL (ref 6–23)
CHLORIDE: 99 meq/L (ref 96–112)
CO2: 26 meq/L (ref 19–32)
CREATININE: 1.14 mg/dL (ref 0.40–1.20)
Calcium: 9.3 mg/dL (ref 8.4–10.5)
GFR: 47.87 mL/min — AB (ref >60.00)
GLUCOSE: 93 mg/dL (ref 70–99)
POTASSIUM: 4.8 meq/L (ref 3.5–5.1)
SODIUM: 133 meq/L — AB (ref 135–145)

## 2016-10-20 ENCOUNTER — Telehealth: Payer: Self-pay | Admitting: Family

## 2016-10-20 MED ORDER — FLUTICASONE PROPIONATE 50 MCG/ACT NA SUSP: 2.0000 | Freq: Every day | NASAL | 3 refills | Status: DC

## 2016-10-20 NOTE — Telephone Encounter (Signed)
Pt called needing a refill for medication of fluticasone (FLONASE) 50 MCG/ACT nasal spray.  Pharmacy is [facility] [address] [place] - [address]  Call pt @ [phone]. Thank you!

## 2016-10-20 NOTE — Telephone Encounter (Signed)
Medication has been refilled as per protocol. 

## 2016-10-23 ENCOUNTER — Ambulatory Visit
Admission: RE | Admit: 2016-10-23 | Discharge: 2016-10-23 | Disposition: A | Discharge status: Home / Self Care | Payer: Medicare Other | Source: Ambulatory Visit | Attending: Family | Admitting: Family

## 2016-10-23 DIAGNOSIS — Z1239 Encounter for other screening for malignant neoplasm of breast: Secondary | ICD-10-CM

## 2016-10-23 DIAGNOSIS — Z1231 Encounter for screening mammogram for malignant neoplasm of breast: Secondary | ICD-10-CM | POA: Insufficient documentation

## 2016-12-12 ENCOUNTER — Encounter: Payer: Self-pay | Admitting: Family

## 2016-12-12 ENCOUNTER — Ambulatory Visit (INDEPENDENT_AMBULATORY_CARE_PROVIDER_SITE_OTHER): Payer: Medicare Other | Admitting: Family

## 2016-12-12 VITALS — BP 136/70 | HR 70 | Temp 98.1°F | Ht 61.0 in | Wt 157.2 lb

## 2016-12-12 DIAGNOSIS — K219 Gastro-esophageal reflux disease without esophagitis: Secondary | ICD-10-CM | POA: Diagnosis not present

## 2016-12-12 DIAGNOSIS — I10 Essential (primary) hypertension: Secondary | ICD-10-CM

## 2016-12-12 LAB — BASIC METABOLIC PANEL
BUN: 17 mg/dL (ref 6–23)
CALCIUM: 9.6 mg/dL (ref 8.4–10.5)
CO2: 27 mEq/L (ref 19–32)
CREATININE: 1.13 mg/dL (ref 0.40–1.20)
Chloride: 100 mEq/L (ref 96–112)
GFR: 48.34 mL/min — AB (ref >60.00)
GLUCOSE: 103 mg/dL — AB (ref 70–99)
POTASSIUM: 4.4 meq/L (ref 3.5–5.1)
Sodium: 136 mEq/L (ref 135–145)

## 2016-12-12 MED ORDER — RANITIDINE HCL 150 MG PO TABS: 150.0000 mg | ORAL_TABLET | Freq: Two times a day (BID) | ORAL | 1 refills | Status: DC

## 2016-12-12 MED ORDER — CARVEDILOL 6.25 MG PO TABS: ORAL_TABLET | ORAL | 6 refills | Status: DC

## 2016-12-12 MED ORDER — LOSARTAN POTASSIUM 100 MG PO TABS: 100.0000 mg | ORAL_TABLET | Freq: Every day | ORAL | 1 refills | Status: DC

## 2016-12-12 MED ORDER — LEVOTHYROXINE SODIUM 88 MCG PO TABS: 88.0000 ug | ORAL_TABLET | Freq: Every day | ORAL | 1 refills | Status: DC

## 2016-12-12 MED ORDER — OMEPRAZOLE 40 MG PO CPDR: 40.0000 mg | DELAYED_RELEASE_CAPSULE | Freq: Every evening | ORAL | 1 refills | Status: DC

## 2016-12-12 MED ORDER — FLUTICASONE PROPIONATE 50 MCG/ACT NA SUSP: 2.0000 | Freq: Every day | NASAL | 3 refills | Status: DC

## 2016-12-12 MED ORDER — MECLIZINE HCL 12.5 MG PO TABS: 12.5000 mg | ORAL_TABLET | Freq: Two times a day (BID) | ORAL | 1 refills | Status: DC | PRN

## 2016-12-12 MED ORDER — AMLODIPINE BESYLATE 10 MG PO TABS: 10.0000 mg | ORAL_TABLET | Freq: Every day | ORAL | 1 refills | Status: DC

## 2016-12-12 MED ORDER — LOVASTATIN 20 MG PO TABS: 20.0000 mg | ORAL_TABLET | Freq: Every day | ORAL | 1 refills | Status: DC

## 2016-12-12 NOTE — Progress Notes (Signed)
Subjective:    Patient ID: Kayla [name], female    DOB: 29-Nov-1926, 83 y.o.   MRN: [id number]  CC: Kayla [name] is a 83 y.o. female who presents today for follow up.   HPI:   HTN- Doesn't check BP at home. Tolerating medications well. Denies exertional chest pain or pressure, numbness or tingling radiating to left arm or jaw, palpitations, dizziness, frequent headaches, changes in vision, or shortness of breath.   GERD- EGD couple years ago, per patient no barrett's esophagus. On zantac in morning and prilosec in the afternoon. Symptoms well controlled    Still off hyzaar( prior med) . On losartan and amlodipine.   HISTORY:  Past Medical History:  Diagnosis Date  . Anginal pain (HCC)   . Arthritis   . Bundle branch block, left    Chronic  . CAD (coronary artery disease)   . Cholecystitis    from record  . Emphysema lung (HCC)   . GERD (gastroesophageal reflux disease)   . Heart murmur   . Hyperlipidemia   . Hypertension   . Hypothyroidism   . Orthostatic headache    Mild  . Vertigo    Past Surgical History:  Procedure Laterality Date  . APPENDECTOMY  1957  . BREAST BIOPSY Left 2011   neg, stereo done by Dr. Evette Cristal  . CARDIAC CATHETERIZATION  2000   [facility]  . CATARACT EXTRACTION Right   . CHOLECYSTECTOMY N/A 04/25/2016   Procedure: LAPAROSCOPIC CHOLECYSTECTOMY WITH INTRAOPERATIVE CHOLANGIOGRAM;  Surgeon: Kieth Brightly, MD;  Location: [facility] ORS;  Service: General;  Laterality: N/A;  . CORONARY ARTERY BYPASS GRAFT  2001  . KNEE SURGERY    . thyroid  1970  . UPPER GI ENDOSCOPY  2014   Family History  Problem Relation Age of Onset  . Heart failure Mother 57  . Heart failure Sister 30  . Heart attack Sister   . Heart attack Brother 42    Allergies: Pollen extract and Motrin [ibuprofen] Current Outpatient Prescriptions on File Prior to Visit  Medication Sig Dispense Refill  . aspirin 81 MG tablet Take 81 mg by mouth daily.      . B Complex Vitamins  (B COMPLEX-B12) TABS Take 1 tablet by mouth daily.      . Cholecalciferol (VITAMIN D) 2000 UNITS tablet Take 2,000 Units by mouth daily.    . Cyanocobalamin (VITAMIN B 12 PO) Take 3,000 Units by mouth daily.     No current facility-administered medications on file prior to visit.     Social History  Substance Use Topics  . Smoking status: Never Smoker  . Smokeless tobacco: Never Used  . Alcohol use No    Review of Systems  Constitutional: Negative for chills and fever.  Eyes: Negative for visual disturbance.  Respiratory: Negative for cough.   Cardiovascular: Negative for chest pain and palpitations.  Gastrointestinal: Negative for nausea and vomiting.  Neurological: Negative for headaches.      Objective:    BP 136/70   Pulse 70   Temp 98.1 F (36.7 C) (Oral)   Ht 5\' 1"  (1.549 m)   Wt 157 lb 3.2 oz (71.3 kg)   SpO2 94%   BMI 29.70 kg/m  BP Readings from Last 3 Encounters:  12/12/16 136/70  09/12/16 128/68  09/04/16 (!) 150/68   Wt Readings from Last 3 Encounters:  12/12/16 157 lb 3.2 oz (71.3 kg)  09/12/16 161 lb 12.8 oz (73.4 kg)  09/04/16 161 lb 12 oz (  73.4 kg)    Physical Exam  Constitutional: She appears well-developed and well-nourished.  Eyes: Conjunctivae are normal.  Cardiovascular: Normal rate, regular rhythm, normal heart sounds and normal pulses.   Pulmonary/Chest: Effort normal and breath sounds normal. She has no wheezes. She has no rhonchi. She has no rales.  Neurological: She is alert.  Skin: Skin is warm and dry.  Psychiatric: She has a normal mood and affect. Her speech is normal and behavior is normal. Thought content normal.  Vitals reviewed.      Assessment & Plan:   Problem List Items Addressed This Visit      Cardiovascular and Mediastinum   Essential hypertension - Primary    Doing well on current regimen. Blood pressure at goal. Discussed patient's prior history of labile blood pressure and advised her to be very vigilant with  lightheadedness, dizziness. Continues to follow with Gollan      Relevant Medications   amLODipine (NORVASC) 10 MG tablet   losartan (COZAAR) 100 MG tablet   lovastatin (MEVACOR) 20 MG tablet   carvedilol (COREG) 6.25 MG tablet   Other Relevant Orders   Basic metabolic panel (Completed)     Digestive   GERD (gastroesophageal reflux disease)    Controlled. Discussed long-term risk of PPIs. Education provided. Patient will trial Zantac twice a day      Relevant Medications   meclizine (ANTIVERT) 12.5 MG tablet   omeprazole (PRILOSEC) 40 MG capsule   ranitidine (ZANTAC) 150 MG tablet       I have changed Kayla [name]'s meclizine. I am also having her maintain her aspirin, B Complex-B12, Cyanocobalamin (VITAMIN B 12 PO), Vitamin D, amLODipine, fluticasone, levothyroxine, losartan, lovastatin, omeprazole, ranitidine, and carvedilol.   Meds ordered this encounter  Medications  . amLODipine (NORVASC) 10 MG tablet    Sig: Take 1 tablet (10 mg total) by mouth daily.    Dispense:  90 tablet    Refill:  1  . fluticasone (FLONASE) 50 MCG/ACT nasal spray    Sig: Place 2 sprays into both nostrils daily.    Dispense:  16 g    Refill:  3  . levothyroxine (SYNTHROID, LEVOTHROID) 88 MCG tablet    Sig: Take 1 tablet (88 mcg total) by mouth daily before breakfast.    Dispense:  90 tablet    Refill:  1  . losartan (COZAAR) 100 MG tablet    Sig: Take 1 tablet (100 mg total) by mouth daily.    Dispense:  90 tablet    Refill:  1  . lovastatin (MEVACOR) 20 MG tablet    Sig: Take 1 tablet (20 mg total) by mouth at bedtime.    Dispense:  90 tablet    Refill:  1  . meclizine (ANTIVERT) 12.5 MG tablet    Sig: Take 1 tablet (12.5 mg total) by mouth 2 (two) times daily as needed.    Dispense:  30 tablet    Refill:  1  . omeprazole (PRILOSEC) 40 MG capsule    Sig: Take 1 capsule (40 mg total) by mouth every evening.    Dispense:  90 capsule    Refill:  1  . ranitidine (ZANTAC) 150 MG tablet     Sig: Take 1 tablet (150 mg total) by mouth 2 (two) times daily.    Dispense:  180 tablet    Refill:  1  . carvedilol (COREG) 6.25 MG tablet    Sig: One pill twice a day    Dispense:  60 tablet    Refill:  6    Return precautions given.   Risks, benefits, and alternatives of the medications and treatment plan prescribed today were discussed, and patient expressed understanding.   Education regarding symptom management and diagnosis given to patient on AVS.  Continue to follow with Allegra GranaArnett, Margaret G, FNP for routine health maintenance.   Kayla EchevariaMargie W Ballowe and I agreed with plan.   Rennie PlowmanMargaret Arnett, FNP

## 2016-12-12 NOTE — Patient Instructions (Signed)
Pleasure seeing you  As discussed, let us know if blood pressure gets to  Low on regimen or you start feeling light headed, dizzy  By the same token, if elevates over 140/90, let me know as well  As discussed-  Long term use beyond 3 months of proton pump inhibitors , also called PPI's, is associated with malabsorption of vitamins, chronic kidney disease, fracture risk, and diarrheal illnesses. PPI's include Nexium, Prilosec, Protonix, Dexilant, and Prevacid.   I generally recommend trying to control acid reflux with lifestyle modifications including avoiding trigger foods, not eating 2 hours prior to bedtime. You may use histamine 2 blockers daily to twice daily ( this is Zantac, Pepcid) and then when symptoms flare, start back on PPI for short course.

## 2016-12-12 NOTE — Progress Notes (Signed)
Pre visit review using our clinic review tool, if applicable. No additional management support is needed unless otherwise documented below in the visit note. 

## 2016-12-13 NOTE — Assessment & Plan Note (Signed)
Doing well on current regimen. Blood pressure at goal. Discussed patient's prior history of labile blood pressure and advised her to be very vigilant with lightheadedness, dizziness. [facility]

## 2016-12-13 NOTE — Assessment & Plan Note (Signed)
Controlled. Discussed long-term risk of PPIs. Education provided. Patient will trial Zantac twice a day

## 2017-02-20 ENCOUNTER — Ambulatory Visit (INDEPENDENT_AMBULATORY_CARE_PROVIDER_SITE_OTHER): Payer: Medicare Other | Admitting: Family

## 2017-02-20 ENCOUNTER — Encounter: Payer: Self-pay | Admitting: Family

## 2017-02-20 DIAGNOSIS — I10 Essential (primary) hypertension: Secondary | ICD-10-CM

## 2017-02-20 MED ORDER — AMLODIPINE BESYLATE 5 MG PO TABS
5.0000 mg | ORAL_TABLET | Freq: Every day | ORAL | 0 refills | Status: DC
Start: 2017-02-20 — End: 2017-06-19

## 2017-02-20 NOTE — Patient Instructions (Addendum)
Trial 5 mg of amlodipine  Monitor blood pressure  Elevate legs - TOES ABOVE NOSE!!   Let me know if swelling doesn't improve

## 2017-02-20 NOTE — Progress Notes (Signed)
Subjective:    Patient ID: Kayla [name], female    DOB: 12/11/1926, 83 y.o.   MRN: [id number]  CC: Kayla [name] is a 83 y.o. female who presents today for follow up.   HPI: HTN- on losartan, amlodipine 10mg . Had been on hctz. Legs started swelling after stopping hctz. Better in the morning. No h/o dvt, orthopnea, sob, chest pain, recent surgeries, or recent immobilization.   gerd- zantac trial is helping; if flares, and 'once and while' takes prilosec.     Echo 2014 EF 45-50%  HISTORY:  Past Medical History:  Diagnosis Date  . Anginal pain (HCC)   . Arthritis   . Bundle branch block, left    Chronic  . CAD (coronary artery disease)   . Cholecystitis    from record  . Emphysema lung (HCC)   . GERD (gastroesophageal reflux disease)   . Heart murmur   . Hyperlipidemia   . Hypertension   . Hypothyroidism   . Orthostatic headache    Mild  . Vertigo    Past Surgical History:  Procedure Laterality Date  . APPENDECTOMY  1957  . BREAST BIOPSY Left 2011   neg, stereo done by Dr. Evette Cristal  . CARDIAC CATHETERIZATION  2000   [facility]  . CATARACT EXTRACTION Right   . CHOLECYSTECTOMY N/A 04/25/2016   Procedure: LAPAROSCOPIC CHOLECYSTECTOMY WITH INTRAOPERATIVE CHOLANGIOGRAM;  Surgeon: Kieth Brightly, MD;  Location: [facility] ORS;  Service: General;  Laterality: N/A;  . CORONARY ARTERY BYPASS GRAFT  2001  . KNEE SURGERY    . thyroid  1970  . UPPER GI ENDOSCOPY  2014   Family History  Problem Relation Age of Onset  . Heart failure Mother 64  . Heart failure Sister 34  . Heart attack Sister   . Heart attack Brother 42    Allergies: Pollen extract and Motrin [ibuprofen] Current Outpatient Prescriptions on File Prior to Visit  Medication Sig Dispense Refill  . aspirin 81 MG tablet Take 81 mg by mouth daily.      . B Complex Vitamins (B COMPLEX-B12) TABS Take 1 tablet by mouth daily.      . carvedilol (COREG) 6.25 MG tablet One pill twice a day 60 tablet 6  .  Cholecalciferol (VITAMIN D) 2000 UNITS tablet Take 2,000 Units by mouth daily.    . Cyanocobalamin (VITAMIN B 12 PO) Take 3,000 Units by mouth daily.    . fluticasone (FLONASE) 50 MCG/ACT nasal spray Place 2 sprays into both nostrils daily. 16 g 3  . levothyroxine (SYNTHROID, LEVOTHROID) 88 MCG tablet Take 1 tablet (88 mcg total) by mouth daily before breakfast. 90 tablet 1  . losartan (COZAAR) 100 MG tablet Take 1 tablet (100 mg total) by mouth daily. 90 tablet 1  . lovastatin (MEVACOR) 20 MG tablet Take 1 tablet (20 mg total) by mouth at bedtime. 90 tablet 1  . meclizine (ANTIVERT) 12.5 MG tablet Take 1 tablet (12.5 mg total) by mouth 2 (two) times daily as needed. 30 tablet 1  . omeprazole (PRILOSEC) 40 MG capsule Take 1 capsule (40 mg total) by mouth every evening. 90 capsule 1  . ranitidine (ZANTAC) 150 MG tablet Take 1 tablet (150 mg total) by mouth 2 (two) times daily. 180 tablet 1   No current facility-administered medications on file prior to visit.     Social History  Substance Use Topics  . Smoking status: Never Smoker  . Smokeless tobacco: Never Used  . Alcohol use No  Review of Systems  Constitutional: Negative for chills and fever.  Eyes: Negative for visual disturbance.  Respiratory: Negative for cough and shortness of breath.   Cardiovascular: Positive for leg swelling. Negative for chest pain and palpitations.  Gastrointestinal: Negative for nausea and vomiting.  Neurological: Negative for headaches.      Objective:    BP 110/60   Pulse 71   Temp 97.7 F (36.5 C) (Oral)   Ht 5\' 1"  (1.549 m)   Wt 161 lb (73 kg)   SpO2 97%   BMI 30.42 kg/m  BP Readings from Last 3 Encounters:  02/20/17 110/60  12/12/16 136/70  09/12/16 128/68   Wt Readings from Last 3 Encounters:  02/20/17 161 lb (73 kg)  12/12/16 157 lb 3.2 oz (71.3 kg)  09/12/16 161 lb 12.8 oz (73.4 kg)    Physical Exam  Constitutional: She appears well-developed and well-nourished.  Eyes:  Conjunctivae are normal.  Cardiovascular: Normal rate, regular rhythm, normal heart sounds and normal pulses.   Bilateral +1 non pittingLE edema No palpable cords or masses. No erythema or increased warmth. No asymmetry in calf size when compared bilaterally LE hair growth symmetric and present. No discoloration of varicosities noted. LE warm and palpable pedal pulses.   Pulmonary/Chest: Effort normal and breath sounds normal. She has no wheezes. She has no rhonchi. She has no rales.  Neurological: She is alert.  Skin: Skin is warm and dry.  Psychiatric: She has a normal mood and affect. Her speech is normal and behavior is normal. Thought content normal.  Vitals reviewed.      Assessment & Plan:   Problem List Items Addressed This Visit      Cardiovascular and Mediastinum   Essential hypertension    Blood pressure well controlled today. We'll trial decreasing amlodipine to 5mg  to see this helps LE swelling as onset of swelling does correlate with increase in amlodipine. If no improvement, will consult cardiology for repeat echocardiogram to evaluate underlying heart failure. Also considering venous insufficiency as contributory to BLE edema. Advised patient to monitor her blood pressure, if elevated over 130/80, patient will let me know.      Relevant Medications   amLODipine (NORVASC) 5 MG tablet       I have changed Kayla [name]'s amLODipine. I am also having her maintain her aspirin, B Complex-B12, Cyanocobalamin (VITAMIN B 12 PO), Vitamin D, fluticasone, levothyroxine, losartan, lovastatin, meclizine, omeprazole, ranitidine, and carvedilol.   Meds ordered this encounter  Medications  . amLODipine (NORVASC) 5 MG tablet    Sig: Take 1 tablet (5 mg total) by mouth daily.    Dispense:  90 tablet    Refill:  0    Order Specific Question:   Supervising Provider    Answer:   Sherlene ShamsULLO, TERESA L [[id number]]    Return precautions given.   Risks, benefits, and alternatives of the  medications and treatment plan prescribed today were discussed, and patient expressed understanding.   Education regarding symptom management and diagnosis given to patient on AVS.  Continue to follow with Allegra GranaArnett, Lavern Crimi G, FNP for routine health maintenance.   Kayla EchevariaMargie W Wender and I agreed with plan.   Rennie PlowmanMargaret Mykal Batiz, FNP

## 2017-02-20 NOTE — Progress Notes (Signed)
Pre visit review using our clinic review tool, if applicable. No additional management support is needed unless otherwise documented below in the visit note. 

## 2017-02-21 NOTE — Assessment & Plan Note (Addendum)
Blood pressure well controlled today. We'll trial decreasing amlodipine to 5mg  to see this helps LE swelling as onset of swelling does correlate with increase in amlodipine. If no improvement, will consult cardiology for repeat echocardiogram to evaluate underlying heart failure. Also considering venous insufficiency as contributory to BLE edema. Advised patient to monitor her blood pressure, if elevated over 130/80, patient will let me know.

## 2017-03-05 NOTE — Progress Notes (Signed)
Cardiology Office Note  Date:  03/06/2017   ID:  Fred, Hammes March 06, 1927, MRN [id number]  PCP:  Allegra Grana, FNP   Chief Complaint  Patient presents with  . other    6 month follow up. Meds reviewed by the pt. verbally. Pt. c/o shortness of breath.     HPI:   [name] is an 83 y/o woman with h/o  CAD s/p CABG in 2001 at [facility],  LBBB,  hyperlipidemia,  GERD   HTN.  Myoview in 2009 was normal.  Mild aortic valve stenosis History of PVCs Here for routine f/u Of her coronary artery disease and hypertension  In follow-up today she reports that she is grieving after loss of her son approximately one month ago He had new LVAD placed, went into liver and kidney failure  Otherwise doing relatively well, denies shortness of breath, chest pain, leg swelling, no change in weight Reports blood pressures typically well controlled Unable to place compression hose, continues to have venous insufficiency  Previously Stopped HCTZ, sodium low  Last carotid u/s in 2015 Carotid ultrasound done at outside facility showing 60% right carotid stenosis, minimal on the left   total cholesterol 124  EKG on today's visit shows normal sinus rhythm with rate 61 bpm, left bundle branch block,   Other past medical history Previous admission to the hospital for chest discomfort. Rule out and was sent home Previous GERD symptoms, takes proton pump inhibitor  PMH:   has a past medical history of Anginal pain (HCC); Arthritis; Bundle branch block, left; CAD (coronary artery disease); Cholecystitis; Emphysema lung (HCC); GERD (gastroesophageal reflux disease); Heart murmur; Hyperlipidemia; Hypertension; Hypothyroidism; Orthostatic headache; and Vertigo.  PSH:    Past Surgical History:  Procedure Laterality Date  . APPENDECTOMY  1957  . BREAST BIOPSY Left 2011   neg, stereo done by Dr. Evette Cristal  . CARDIAC CATHETERIZATION  2000   [facility]  . CATARACT EXTRACTION Right   .  CHOLECYSTECTOMY N/A 04/25/2016   Procedure: LAPAROSCOPIC CHOLECYSTECTOMY WITH INTRAOPERATIVE CHOLANGIOGRAM;  Surgeon: Kieth Brightly, MD;  Location: [facility] ORS;  Service: General;  Laterality: N/A;  . CORONARY ARTERY BYPASS GRAFT  2001  . KNEE SURGERY    . thyroid  1970  . UPPER GI ENDOSCOPY  2014    Current Outpatient Prescriptions  Medication Sig Dispense Refill  . amLODipine (NORVASC) 5 MG tablet Take 1 tablet (5 mg total) by mouth daily. 90 tablet 0  . aspirin 81 MG tablet Take 81 mg by mouth daily.      . B Complex Vitamins (B COMPLEX-B12) TABS Take 1 tablet by mouth daily.      . carvedilol (COREG) 6.25 MG tablet One pill twice a day 60 tablet 6  . Cholecalciferol (VITAMIN D) 2000 UNITS tablet Take 2,000 Units by mouth daily.    . Cyanocobalamin (VITAMIN B 12 PO) Take 3,000 Units by mouth daily.    . fluticasone (FLONASE) 50 MCG/ACT nasal spray Place 2 sprays into both nostrils daily. 16 g 3  . levothyroxine (SYNTHROID, LEVOTHROID) 88 MCG tablet Take 1 tablet (88 mcg total) by mouth daily before breakfast. 90 tablet 1  . losartan (COZAAR) 100 MG tablet Take 1 tablet (100 mg total) by mouth daily. 90 tablet 1  . lovastatin (MEVACOR) 20 MG tablet Take 1 tablet (20 mg total) by mouth at bedtime. 90 tablet 1  . meclizine (ANTIVERT) 12.5 MG tablet Take 1 tablet (12.5 mg total) by mouth 2 (two) times daily  as needed. 30 tablet 1  . omeprazole (PRILOSEC) 40 MG capsule Take 1 capsule (40 mg total) by mouth every evening. 90 capsule 1  . ranitidine (ZANTAC) 150 MG tablet Take 1 tablet (150 mg total) by mouth 2 (two) times daily. 180 tablet 1   No current facility-administered medications for this visit.      Allergies:   Pollen extract and Motrin [ibuprofen]   Social History:  The patient  reports that she has never smoked. She has never used smokeless tobacco. She reports that she does not drink alcohol or use drugs.   Family History:   family history includes Heart attack in her  sister; Heart attack (age of onset: 78) in her brother; Heart failure (age of onset: 2) in her sister; Heart failure (age of onset: 68) in her mother.    Review of Systems: Review of Systems  Constitutional: Negative.   Respiratory: Negative.   Cardiovascular: Negative.   Gastrointestinal: Negative.   Musculoskeletal: Negative.   Neurological: Negative.   Psychiatric/Behavioral: Negative.   All other systems reviewed and are negative.    PHYSICAL EXAM: VS:  BP (!) 160/72 (BP Location: Left Arm, Patient Position: Sitting, Cuff Size: Normal)   Pulse 61   Ht 5\' 1"  (1.549 m)   Wt 158 lb 12 oz (72 kg)   BMI 30.00 kg/m  , BMI Body mass index is 30 kg/m.repeat blood pressure by myself 140 systolic  GEN: Well nourished, well developed, in no acute distress  HEENT: normal  Neck: no JVD, carotid bruits, or masses Cardiac: RRR; II/VI SEM RSB,  No rubs, or gallops,nonpitting leg swelling  Respiratory:  clear to auscultation bilaterally, normal work of breathing GI: soft, nontender, nondistended, + BS MS: no deformity or atrophy  Skin: warm and dry, no rash Neuro:  Strength and sensation are intact Psych: euthymic mood, full affect    Recent Labs: 03/28/2016: Hemoglobin 13.2; Platelets 183 08/09/2016: ALT 11 09/12/2016: TSH 4.43 12/12/2016: BUN 17; Creatinine, Ser 1.13; Potassium 4.4; Sodium 136    Lipid Panel Lab Results  Component Value Date   CHOL 124 09/12/2016   HDL 53.00 09/12/2016   LDLCALC 55 09/12/2016   TRIG 82.0 09/12/2016      Wt Readings from Last 3 Encounters:  03/06/17 158 lb 12 oz (72 kg)  02/20/17 161 lb (73 kg)  12/12/16 157 lb 3.2 oz (71.3 kg)       ASSESSMENT AND PLAN:   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 No further workup at this time. Continue current medication regimen.  Essential hypertension  Initial blood  pressure elevated, improved on recheck down to 140 systolic No medication changes made  Aortic valve stenosis, etiology of cardiac valve disease unspecified -  Mild aortic valve stenosis, murmur on exam  Carotid stenosis, right 40-59% stenosis on the right, slightly worse over the past several years Less than 39% blockage on the left, minimally worse last evaluated arch 2018  Hyperlipidemia Cholesterol is at goal on the current lipid regimen. No changes to the medications were made.   Total encounter time more than 25 minutes  Greater than 50% was spent in counseling and coordination of care with the patient  Disposition:   F/U  12 months   Orders Placed This Encounter  Procedures  . EKG 12-Lead     Signed, Dossie Arbour, M.D., Ph.D. 03/06/2017  [place]  [place], [place]

## 2017-03-06 ENCOUNTER — Ambulatory Visit (INDEPENDENT_AMBULATORY_CARE_PROVIDER_SITE_OTHER): Payer: Medicare Other | Admitting: Cardiovascular Disease

## 2017-03-06 ENCOUNTER — Encounter: Payer: Self-pay | Admitting: Cardiovascular Disease

## 2017-03-06 VITALS — BP 140/72 | HR 61 | Ht 61.0 in | Wt 158.8 lb

## 2017-03-06 DIAGNOSIS — I25718 Atherosclerosis of autologous vein coronary artery bypass graft(s) with other forms of angina pectoris: Secondary | ICD-10-CM

## 2017-03-06 DIAGNOSIS — I350 Nonrheumatic aortic (valve) stenosis: Secondary | ICD-10-CM

## 2017-03-06 DIAGNOSIS — E782 Mixed hyperlipidemia: Secondary | ICD-10-CM

## 2017-03-06 DIAGNOSIS — I10 Essential (primary) hypertension: Secondary | ICD-10-CM | POA: Diagnosis not present

## 2017-03-06 NOTE — Patient Instructions (Signed)

## 2017-04-02 ENCOUNTER — Encounter: Payer: Self-pay | Admitting: *Deleted

## 2017-04-12 ENCOUNTER — Ambulatory Visit: Payer: Medicare Other | Admitting: Anesthesiology

## 2017-04-12 ENCOUNTER — Ambulatory Visit
Admission: RE | Admit: 2017-04-12 | Discharge: 2017-04-12 | Disposition: A | Discharge status: Home / Self Care | Payer: Medicare Other | Source: Ambulatory Visit | Attending: Ophthalmology | Admitting: Ophthalmology

## 2017-04-12 ENCOUNTER — Encounter
Admission: RE | Disposition: A | Discharge status: Home / Self Care | Payer: Self-pay | Source: Ambulatory Visit | Attending: Ophthalmology

## 2017-04-12 ENCOUNTER — Encounter: Payer: Self-pay | Admitting: *Deleted

## 2017-04-12 DIAGNOSIS — I2510 Atherosclerotic heart disease of native coronary artery without angina pectoris: Secondary | ICD-10-CM | POA: Diagnosis not present

## 2017-04-12 DIAGNOSIS — J449 Chronic obstructive pulmonary disease, unspecified: Secondary | ICD-10-CM | POA: Insufficient documentation

## 2017-04-12 DIAGNOSIS — Z79899 Other long term (current) drug therapy: Secondary | ICD-10-CM | POA: Diagnosis not present

## 2017-04-12 DIAGNOSIS — H2512 Age-related nuclear cataract, left eye: Secondary | ICD-10-CM | POA: Diagnosis present

## 2017-04-12 DIAGNOSIS — G4730 Sleep apnea, unspecified: Secondary | ICD-10-CM | POA: Diagnosis not present

## 2017-04-12 DIAGNOSIS — I10 Essential (primary) hypertension: Secondary | ICD-10-CM | POA: Insufficient documentation

## 2017-04-12 DIAGNOSIS — K219 Gastro-esophageal reflux disease without esophagitis: Secondary | ICD-10-CM | POA: Diagnosis not present

## 2017-04-12 DIAGNOSIS — I739 Peripheral vascular disease, unspecified: Secondary | ICD-10-CM | POA: Insufficient documentation

## 2017-04-12 HISTORY — DX: Unspecified hearing loss, unspecified ear: H91.90

## 2017-04-12 HISTORY — DX: Pain, unspecified: R52

## 2017-04-12 HISTORY — PX: CATARACT EXTRACTION W/PHACO: SHX586

## 2017-04-12 SURGERY — PHACOEMULSIFICATION, CATARACT, WITH IOL INSERTION
Anesthesia: Monitor Anesthesia Care | Site: Eye | Laterality: Left | Wound class: Clean

## 2017-04-12 MED ORDER — ARMC OPHTHALMIC DILATING DROPS
OPHTHALMIC | Status: AC
  Administered 2017-04-12: 1 via OPHTHALMIC
  Filled 2017-04-12: qty 0.4

## 2017-04-12 MED ORDER — ARMC OPHTHALMIC DILATING DROPS
1.0000 "application " | OPHTHALMIC | Status: AC
  Administered 2017-04-12 (×3): 1 via OPHTHALMIC

## 2017-04-12 MED ORDER — MOXIFLOXACIN HCL 0.5 % OP SOLN
OPHTHALMIC | Status: AC
  Administered 2017-04-12: 1 [drp] via OPHTHALMIC
  Filled 2017-04-12: qty 3

## 2017-04-12 MED ORDER — ONDANSETRON HCL 4 MG/2ML IJ SOLN
INTRAMUSCULAR | Status: DC | PRN
  Administered 2017-04-12: 4 mg via INTRAVENOUS

## 2017-04-12 MED ORDER — MOXIFLOXACIN HCL 0.5 % OP SOLN
1.0000 [drp] | OPHTHALMIC | Status: AC | PRN
  Administered 2017-04-12 (×3): 1 [drp] via OPHTHALMIC

## 2017-04-12 MED ORDER — NA HYALUR & NA CHOND-NA HYALUR 0.55-0.5 ML IO KIT
PACK | INTRAOCULAR | Status: AC
Start: 2017-04-12 — End: ?
  Filled 2017-04-12: qty 1.05

## 2017-04-12 MED ORDER — LIDOCAINE HCL (PF) 4 % IJ SOLN
INTRAMUSCULAR | Status: AC
  Filled 2017-04-12: qty 5

## 2017-04-12 MED ORDER — ONDANSETRON HCL 4 MG/2ML IJ SOLN
INTRAMUSCULAR | Status: AC
  Filled 2017-04-12: qty 2

## 2017-04-12 MED ORDER — EPINEPHRINE PF 1 MG/ML IJ SOLN
INTRAMUSCULAR | Status: AC
  Filled 2017-04-12: qty 2

## 2017-04-12 MED ORDER — FENTANYL CITRATE (PF) 100 MCG/2ML IJ SOLN
INTRAMUSCULAR | Status: DC | PRN
  Administered 2017-04-12: 50 ug via INTRAVENOUS
  Administered 2017-04-12: 25 ug via INTRAVENOUS

## 2017-04-12 MED ORDER — MOXIFLOXACIN HCL 0.5 % OP SOLN: 1.0000 [drp] | OPHTHALMIC | Status: DC | PRN

## 2017-04-12 MED ORDER — CARBACHOL 0.01 % IO SOLN
INTRAOCULAR | Status: DC | PRN
  Administered 2017-04-12: 0.5 mL via INTRAOCULAR

## 2017-04-12 MED ORDER — SODIUM CHLORIDE 0.9 % IV SOLN
INTRAVENOUS | Status: DC
  Administered 2017-04-12: 07:00:00 via INTRAVENOUS

## 2017-04-12 MED ORDER — MOXIFLOXACIN HCL 0.5 % OP SOLN
OPHTHALMIC | Status: AC
  Filled 2017-04-12: qty 3

## 2017-04-12 MED ORDER — EPINEPHRINE PF 1 MG/ML IJ SOLN
INTRAOCULAR | Status: DC | PRN
  Administered 2017-04-12: 09:00:00 via OPHTHALMIC

## 2017-04-12 MED ORDER — FENTANYL CITRATE (PF) 100 MCG/2ML IJ SOLN
INTRAMUSCULAR | Status: AC
  Filled 2017-04-12: qty 2

## 2017-04-12 MED ORDER — NEOMYCIN-POLYMYXIN-DEXAMETH 0.1 % OP OINT
TOPICAL_OINTMENT | OPHTHALMIC | Status: DC | PRN
  Administered 2017-04-12: 1 via OPHTHALMIC

## 2017-04-12 MED ORDER — POVIDONE-IODINE 5 % OP SOLN
OPHTHALMIC | Status: DC | PRN
  Administered 2017-04-12: 1 via OPHTHALMIC

## 2017-04-12 MED ORDER — POVIDONE-IODINE 5 % OP SOLN
OPHTHALMIC | Status: AC
  Filled 2017-04-12: qty 30

## 2017-04-12 MED ORDER — LIDOCAINE HCL (PF) 4 % IJ SOLN
INTRAOCULAR | Status: DC | PRN
  Administered 2017-04-12: 4 mL via OPHTHALMIC

## 2017-04-12 MED ORDER — NA HYALUR & NA CHOND-NA HYALUR 0.4-0.35 ML IO KIT
PACK | INTRAOCULAR | Status: DC | PRN
  Administered 2017-04-12: .35 mL via INTRAOCULAR

## 2017-04-12 SURGICAL SUPPLY — 16 items
GLOVE BIO SURGEON STRL SZ8 (GLOVE) ×2 IMPLANT
GLOVE BIOGEL M 6.5 STRL (GLOVE) ×2 IMPLANT
GLOVE SURG LX 7.5 STRW (GLOVE) ×1
GLOVE SURG LX STRL 7.5 STRW (GLOVE) ×1 IMPLANT
GOWN STRL REUS W/ TWL LRG LVL3 (GOWN DISPOSABLE) ×2 IMPLANT
GOWN STRL REUS W/TWL LRG LVL3 (GOWN DISPOSABLE) ×4
LABEL CATARACT MEDS ST (LABEL) ×2 IMPLANT
LENS IOL ACRYSOF IQ 22.0 (Intraocular Lens) ×1 IMPLANT
PACK CATARACT (MISCELLANEOUS) ×2 IMPLANT
PACK CATARACT BRASINGTON LX (MISCELLANEOUS) ×2 IMPLANT
PACK EYE AFTER SURG (MISCELLANEOUS) ×2 IMPLANT
SOL BSS BAG (MISCELLANEOUS) ×2
SOLUTION BSS BAG (MISCELLANEOUS) ×1 IMPLANT
SYR 5ML LL (SYRINGE) ×2 IMPLANT
WATER STERILE IRR 250ML POUR (IV SOLUTION) ×2 IMPLANT
WIPE NON LINTING 3.25X3.25 (MISCELLANEOUS) ×2 IMPLANT

## 2017-04-12 NOTE — Anesthesia Preprocedure Evaluation (Signed)
Anesthesia Evaluation  Patient identified by MRN, date of birth, ID band Patient awake    Reviewed: Allergy & Precautions, NPO status , Patient's Chart, lab work & pertinent test results  History of Anesthesia Complications Negative for: history of anesthetic complications  Airway Mallampati: II       Dental  (+) Missing, Chipped   Pulmonary neg sleep apnea, COPD (pt denies),           Cardiovascular hypertension, Pt. on medications + CAD, + CABG and + Peripheral Vascular Disease  + Valvular Problems/Murmurs (murmur, no tx)      Neuro/Psych neg Seizures    GI/Hepatic Neg liver ROS, GERD  Medicated and Poorly Controlled,  Endo/Other  neg diabetesHypothyroidism   Renal/GU Renal InsufficiencyRenal disease     Musculoskeletal   Abdominal   Peds  Hematology   Anesthesia Other Findings   Reproductive/Obstetrics                             Anesthesia Physical Anesthesia Plan  ASA: III  Anesthesia Plan: MAC   Post-op Pain Management:    Induction: Intravenous  PONV Risk Score and Plan:   Airway Management Planned: Nasal Cannula  Additional Equipment:   Intra-op Plan:   Post-operative Plan:   Informed Consent: I have reviewed the patients History and Physical, chart, labs and discussed the procedure including the risks, benefits and alternatives for the proposed anesthesia with the patient or authorized representative who has indicated his/her understanding and acceptance.     Plan Discussed with:   Anesthesia Plan Comments:         Anesthesia Quick Evaluation

## 2017-04-12 NOTE — H&P (Signed)
The History and Physical notes are on paper, have been signed, and are to be scanned. The patient remains stable and unchanged from the H&P.   Previous H&P reviewed, patient examined, and there are no changes.  Kayla [name] 04/12/2017 8:11 AM

## 2017-04-12 NOTE — Anesthesia Post-op Follow-up Note (Signed)
Anesthesia QCDR form completed.        

## 2017-04-12 NOTE — Op Note (Signed)
OPERATIVE NOTE  Kayla [name] [id number] 04/12/2017   PREOPERATIVE DIAGNOSIS:  Nuclear sclerotic cataract left eye. H25.12   POSTOPERATIVE DIAGNOSIS:    Nuclear sclerotic cataract left eye.     PROCEDURE:  Phacoemusification with posterior chamber intraocular lens placement of the left eye   LENS:   Implant Name Type Inv. Item Serial No. Manufacturer Lot No. LRB No. Used  LENS IOL ACRYSOF IQ 22.0 - [id number] Intraocular Lens LENS IOL ACRYSOF IQ 22.0 [id number] [employer]   Left 1        ULTRASOUND TIME: 24 % of 1 minutes, 9 seconds.  CDE 16.4   SURGEON:  Deirdre Evener, MD   ANESTHESIA:  Topical with tetracaine drops and 2% Xylocaine jelly, augmented with 1% preservative-free intracameral lidocaine.    COMPLICATIONS:  None.   DESCRIPTION OF PROCEDURE:  The patient was identified in the holding room and transported to the operating room and placed in the supine position under the operating microscope.  The left eye was identified as the operative eye and it was prepped and draped in the usual sterile ophthalmic fashion.   A 1 millimeter clear-corneal paracentesis was made at the 1:30 position. 0.5 ml of preservative-free 1% lidocaine was injected into the anterior chamber.  The anterior chamber was filled with Viscoat viscoelastic.  A 2.4 millimeter keratome was used to make a near-clear corneal incision at the 10:30 position.  .  A curvilinear capsulorrhexis was made with a cystotome and capsulorrhexis forceps.  Balanced salt solution was used to hydrodissect and hydrodelineate the nucleus.   Phacoemulsification was then used in stop and chop fashion to remove the lens nucleus and epinucleus.  The remaining cortex was then removed using the irrigation and aspiration handpiece. Provisc was then placed into the capsular bag to distend it for lens placement.  A lens was then injected into the capsular bag.  The remaining viscoelastic was aspirated.   Wounds were hydrated with  balanced salt solution.  The anterior chamber was inflated to a physiologic pressure with balanced salt solution. Vigamox 0.2 ml of a  per ml solution was injected into the anterior chamber for a dose of 0.2 mg of intracameral antibiotic at the completion of the case.  Miostat was placed into the anterior chamber to constrict the pupil.  No wound leaks were noted.  Topical Vigamox drops and Maxitrol ointment were applied to the eye.  The patient was taken to the recovery room in stable condition without complications of anesthesia or surgery  [name] 04/12/2017, 9:00 AM

## 2017-04-12 NOTE — Anesthesia Postprocedure Evaluation (Signed)
Anesthesia Post Note  Patient: Kayla [name]  Procedure(s) Performed: Procedure(s) (LRB): CATARACT EXTRACTION PHACO AND INTRAOCULAR LENS PLACEMENT (IOC) (Left)  Patient location during evaluation: Other Anesthesia Type: MAC Level of consciousness: awake and alert Pain management: pain level controlled Vital Signs Assessment: post-procedure vital signs reviewed and stable Respiratory status: spontaneous breathing and respiratory function stable Cardiovascular status: stable Anesthetic complications: no     Last Vitals:  Vitals:   04/12/17 0709 04/12/17 0901  BP: (!) 191/57 (!) 114/95  Pulse: (!) 56 75  Resp: 18 17  Temp: (!) 36.4 C (!) 36.2 C  SpO2: 99% 97%    Last Pain:  Vitals:   04/12/17 0901  TempSrc: Temporal                 Brevin Mcfadden K

## 2017-04-12 NOTE — Transfer of Care (Signed)
Immediate Anesthesia Transfer of Care Note  Patient: Kayla [name]  Procedure(s) Performed: Procedure(s) with comments: CATARACT EXTRACTION PHACO AND INTRAOCULAR LENS PLACEMENT (IOC) (Left) - [place] 01:09.0 AP% 23.7 CDE 16.35 FLUID PACK LOT # [id number]  Patient Location: PACU  Anesthesia Type:MAC  Level of Consciousness: awake, alert  and oriented  Airway & Oxygen Therapy: Patient Spontanous Breathing  Post-op Assessment: Report given to RN and Post -op Vital signs reviewed and stable  Post vital signs: Reviewed and stable  Last Vitals:  Vitals:   04/12/17 0709  BP: (!) 191/57  Pulse: (!) 56  Resp: 18  Temp: (!) 36.4 C  SpO2: 99%    Last Pain:  Vitals:   04/12/17 0709  TempSrc: Tympanic         Complications: No apparent anesthesia complications

## 2017-04-12 NOTE — Discharge Instructions (Addendum)
Eye Surgery Discharge Instructions  Expect mild scratchy sensation or mild soreness. DO NOT RUB YOUR EYE!  The day of surgery:  Minimal physical activity, but bed rest is not required  No reading, computer work, or close hand work  No bending, lifting, or straining.  May watch TV  For 24 hours:  No driving, legal decisions, or alcoholic beverages  Safety precautions  Eat anything you prefer: It is better to start with liquids, then soup then solid foods.  _____ Eye patch should be worn until postoperative exam tomorrow.  ____ Solar shield eyeglasses should be worn for comfort in the sunlight/patch while sleeping  Resume all regular medications including aspirin or Coumadin if these were discontinued prior to surgery. You may shower, bathe, shave, or wash your hair. Tylenol may be taken for mild discomfort.  Call your doctor if you experience significant pain, nausea, or vomiting, fever > 101 or other signs of infection. [phone] or [phone] Specific instructions:  Follow-up Information    Lockie Mola, MD Follow up on 04/13/2017.   Specialty:  Ophthalmology Why:  follow up appointment in the office at 9:35 am. Wear your sunglasses to the appointment Contact information: [address]   [place] [address] [phone]          Resume medications once tolerating solid foods.  If vomiting does not stop, please notify dr. Reinaldo Raddle' office

## 2017-04-13 ENCOUNTER — Encounter: Payer: Self-pay | Admitting: Ophthalmology

## 2017-05-24 ENCOUNTER — Other Ambulatory Visit: Payer: Self-pay | Admitting: Family

## 2017-06-14 ENCOUNTER — Ambulatory Visit: Payer: Self-pay | Admitting: Family

## 2017-06-19 ENCOUNTER — Ambulatory Visit: Payer: Medicare Other | Admitting: Internal Medicine

## 2017-06-19 ENCOUNTER — Encounter: Payer: Self-pay | Admitting: Internal Medicine

## 2017-06-19 VITALS — BP 128/78 | HR 67 | Temp 98.0°F | Resp 18 | Ht 61.0 in | Wt 159.4 lb

## 2017-06-19 DIAGNOSIS — I350 Nonrheumatic aortic (valve) stenosis: Secondary | ICD-10-CM

## 2017-06-19 DIAGNOSIS — M25561 Pain in right knee: Secondary | ICD-10-CM | POA: Diagnosis not present

## 2017-06-19 DIAGNOSIS — M25562 Pain in left knee: Secondary | ICD-10-CM

## 2017-06-19 DIAGNOSIS — Z1329 Encounter for screening for other suspected endocrine disorder: Secondary | ICD-10-CM

## 2017-06-19 DIAGNOSIS — G8929 Other chronic pain: Secondary | ICD-10-CM | POA: Diagnosis not present

## 2017-06-19 DIAGNOSIS — I10 Essential (primary) hypertension: Secondary | ICD-10-CM

## 2017-06-19 DIAGNOSIS — I2510 Atherosclerotic heart disease of native coronary artery without angina pectoris: Secondary | ICD-10-CM

## 2017-06-19 DIAGNOSIS — M810 Age-related osteoporosis without current pathological fracture: Secondary | ICD-10-CM | POA: Diagnosis not present

## 2017-06-19 MED ORDER — AMLODIPINE BESYLATE 5 MG PO TABS: 5.0000 mg | ORAL_TABLET | Freq: Every day | ORAL | 1 refills | Status: DC

## 2017-06-19 MED ORDER — PRAVASTATIN SODIUM 20 MG PO TABS: 20.0000 mg | ORAL_TABLET | Freq: Every day | ORAL | 0 refills | Status: DC

## 2017-06-19 MED ORDER — IBANDRONATE SODIUM 150 MG PO TABS: 150.0000 mg | ORAL_TABLET | ORAL | 4 refills | Status: DC

## 2017-06-19 NOTE — Progress Notes (Signed)
Chief Complaint  Patient presents with  . Follow-up    htn   Kayla [name] presents for f/u with daughter Kayla DroneBrenda  1. HTN BP controlled She needs Norvasc 5 mg tablets and has been cutting 10 mg in 1/2, coreg 6.25 bid, losartan 100 mg qd  2. She thinks Mevacor 20 mg is causing back itching, Emphasized importance of being on statin due to CAD  3. Reviewed DEXA 08/18/15 +osteporosis she is not on anything but vit D 2000 or 5000 mg qd and does not take calcium 2/2 CAD.     Review of Systems  Respiratory: Negative for shortness of breath.   Cardiovascular: Negative for chest pain.  Gastrointestinal: Negative for abdominal pain.  Musculoskeletal: Positive for joint pain.   Past Medical History:  Diagnosis Date  . Anginal pain (HCC)   . Arthritis   . Bundle branch block, left    Chronic  . CAD (coronary artery disease)   . Cholecystitis    from record  . Emphysema lung (HCC)   . GERD (gastroesophageal reflux disease)   . Heart murmur   . HOH (hard of hearing)   . Hyperlipidemia   . Hypertension   . Hypothyroidism   . Orthostatic headache    Mild  . Pain    HIP AND LEG PAIN USES  CANE  . Vertigo    Past Surgical History:  Procedure Laterality Date  . APPENDECTOMY  1957  . BREAST BIOPSY Left 2011   neg, stereo done by Dr. Evette CristalSankar  . CARDIAC CATHETERIZATION  2000   [facility]  . CATARACT EXTRACTION Right    right 2004, left 2018   . CATARACT EXTRACTION W/PHACO Left 04/12/2017   Procedure: CATARACT EXTRACTION PHACO AND INTRAOCULAR LENS PLACEMENT (IOC);  Surgeon: Lockie MolaBrasington, Chadwick, MD;  Location: [facility] ORS;  Service: Ophthalmology;  Laterality: Left;  US 01:09.0AP% 23.7CDE 16.35FLUID PACK LOT # [id number] H  . CHOLECYSTECTOMY N/A 04/25/2016   Procedure: LAPAROSCOPIC CHOLECYSTECTOMY WITH INTRAOPERATIVE CHOLANGIOGRAM;  Surgeon: Kieth BrightlySeeplaputhur G Sankar, MD;  Location: [facility] ORS;  Service: General;  Laterality: N/A;  . CORONARY ANGIOPLASTY     STENT  . CORONARY ARTERY BYPASS GRAFT  2001  . KNEE SURGERY     . thyroid  1970  . UPPER GI ENDOSCOPY  2014   Family History  Problem Relation Age of Onset  . Heart failure Mother 3376  . Heart failure Sister 7348  . Heart attack Sister   . Heart attack Brother 3942   Social History   Socioeconomic History  . Marital status: Widowed    Spouse name: Not on file  . Number of children: Not on file  . Years of education: Not on file  . Highest education level: Not on file  Social Needs  . Financial resource strain: Not on file  . Food insecurity - worry: Not on file  . Food insecurity - inability: Not on file  . Transportation needs - medical: Not on file  . Transportation needs - non-medical: Not on file  Occupational History  . Occupation: Part time  Tobacco Use  . Smoking status: Never Smoker  . Smokeless tobacco: Never Used  Substance and Sexual Activity  . Alcohol use: No  . Drug use: No  . Sexual activity: No  Other Topics Concern  . Not on file  Social History Narrative   Widow   Lives with one one her daughters      6 children      [place] in [place]  Current Meds  Medication Sig  . amLODipine (NORVASC) 5 MG tablet Take 1 tablet (5 mg total) by mouth daily.  [name] aspirin 81 MG tablet Take 81 mg by mouth daily.    . carvedilol (COREG) 6.25 MG tablet One pill twice a day  . Cholecalciferol (VITAMIN D) 2000 UNITS tablet Take 2,000 Units by mouth daily.  . Cyanocobalamin (VITAMIN B 12 PO) Take 3,000 Units by mouth daily.  . fluticasone (FLONASE) 50 MCG/ACT nasal spray Place 2 sprays into both nostrils daily.  [name] levothyroxine (SYNTHROID, LEVOTHROID) 88 MCG tablet Take 1 tablet (88 mcg total) by mouth daily before breakfast.  . losartan (COZAAR) 100 MG tablet Take 1 tablet (100 mg total) by mouth daily.  . meclizine (ANTIVERT) 12.5 MG tablet Take 1 tablet (12.5 mg total) by mouth 2 (two) times daily as needed.  [name] omeprazole (PRILOSEC) 40 MG capsule Take 1 capsule (40 mg total) by mouth every evening.  . ranitidine  (ZANTAC) 150 MG tablet Take 1 tablet (150 mg total) by mouth 2 (two) times daily.  . [DISCONTINUED] amLODipine (NORVASC) 5 MG tablet Take 1 tablet (5 mg total) by mouth daily.  . [DISCONTINUED] lovastatin (MEVACOR) 20 MG tablet TAKE 1 TABLET BY MOUTH AT  BEDTIME   Allergies  Allergen Reactions  . Pollen Extract   . Motrin [Ibuprofen] Rash   No results found for this or any previous visit (from the past 2160 hour(s)). Objective  Body mass index is 30.11 kg/m. Wt Readings from Last 3 Encounters:  06/19/17 159 lb 6 oz (72.3 kg)  04/12/17 158 lb (71.7 kg)  03/06/17 158 lb 12 oz (72 kg)   Temp Readings from Last 3 Encounters:  06/19/17 98 F (36.7 C) (Oral)  04/12/17 (!) 97.2 F (36.2 C) (Temporal)  02/20/17 97.7 F (36.5 C) (Oral)   BP Readings from Last 3 Encounters:  06/19/17 128/78  04/12/17 (!) 153/58  03/06/17 140/72   Pulse Readings from Last 3 Encounters:  06/19/17 67  04/12/17 (!) 56  03/06/17 61   Pulse oximetry on room air is 96% Physical Exam  Constitutional: She is oriented to person, place, and time and well-developed, well-nourished, and in no distress. Vital signs are normal.  HENT:  Head: Normocephalic and atraumatic.  Mouth/Throat: Oropharynx is clear and moist and mucous membranes are normal.  Eyes: Conjunctivae are normal. Pupils are equal, round, and reactive to light.  Cardiovascular: Normal rate and regular rhythm.  Murmur heard. Pulmonary/Chest: Effort normal and breath sounds normal.  Neurological: She is alert and oriented to person, place, and time. Gait normal.  BL walks with cane  Skin: Skin is warm and dry.  excorations to upper back   Psychiatric: Mood, memory, affect and judgment normal.  Nursing note and vitals reviewed.  Assessment   1. HTN  2. Osteoporosis +DEXA 08/18/15 3. Chronic knee pain  4. CAD (Dr. Gollan)/cardiac murmur with AS hx 5. HM Plan  1.  Rx norvasc change 10 mg to 5 mg qd  2.  Rx Boniva 150 monthly GFR>35 no  med adjustment  Cont vit D check labs Monday  Kayla [name] unable to be on Calcium due to h/o CAD  3. S/p knee injections x 3 each knee this year  F/u ortho prn  4.  F/u cards  Change mevacor 20 mg to Pravastain 20 mg qhs but due to CAD needs to be on statin. Will see how this goes  5.  Had flu, prevnar, pna 23 rec get Tdap  health dept/pharmacy Declines shingrix vaccine  Fasting labs Monday  Out of age window pap Colonoscopy out of age window  mammo neg 10/23/16  DEXA 08/18/15 +osteoporosis   Provider: Dr. French Anaracy McLean-Scocuzza

## 2017-06-19 NOTE — Patient Instructions (Addendum)
Please schedule labs Monday  Follow up in 3 months sooner if needed  Happy Holidays  Think about Tdap vaccine  Stop Mevacor/Lovastatin and start Pravachol 1 20 mg 1 pill at night  Take Boniva for osteoporosis 1x per month     Osteoporosis Osteoporosis happens when your bones become thinner and weaker. Weak bones can break (fracture) more easily when you slip or fall. Bones most at risk of breaking are in the hip, wrist, and spine. Follow these instructions at home:  Get enough calcium and vitamin D. These nutrients are good for your bones.  Exercise as told by your doctor.  Do not use any tobacco products. This includes cigarettes, chewing tobacco, and electronic cigarettes. If you need help quitting, ask your doctor.  Limit the amount of alcohol you drink.  Take medicines only as told by your doctor.  Keep all follow-up visits as told by your doctor. This is important.  Take care at home to prevent falls. Some ways to do this are: ? Keep rooms well lit and tidy. ? Put safety rails on your stairs. ? Put a rubber mat in the bathroom and other places that are often wet or slippery. Get help right away if:  You fall.  You hurt yourself. This information is not intended to replace advice given to you by your health care provider. Make sure you discuss any questions you have with your health care provider. Document Released: 09/25/2011 Document Revised: 12/09/2015 Document Reviewed: 12/11/2013 Elsevier Interactive Patient Education  [employer]2018 Else[employer] Inc[employer]

## 2017-06-25 ENCOUNTER — Other Ambulatory Visit: Payer: Self-pay

## 2017-06-25 ENCOUNTER — Other Ambulatory Visit: Payer: Self-pay | Admitting: Family

## 2017-06-25 DIAGNOSIS — I10 Essential (primary) hypertension: Secondary | ICD-10-CM

## 2017-06-25 DIAGNOSIS — K219 Gastro-esophageal reflux disease without esophagitis: Secondary | ICD-10-CM

## 2017-06-29 ENCOUNTER — Other Ambulatory Visit (INDEPENDENT_AMBULATORY_CARE_PROVIDER_SITE_OTHER): Payer: Medicare Other

## 2017-06-29 DIAGNOSIS — Z1329 Encounter for screening for other suspected endocrine disorder: Secondary | ICD-10-CM

## 2017-06-29 DIAGNOSIS — I2510 Atherosclerotic heart disease of native coronary artery without angina pectoris: Secondary | ICD-10-CM

## 2017-06-29 DIAGNOSIS — M810 Age-related osteoporosis without current pathological fracture: Secondary | ICD-10-CM | POA: Diagnosis not present

## 2017-06-29 DIAGNOSIS — I10 Essential (primary) hypertension: Secondary | ICD-10-CM | POA: Diagnosis not present

## 2017-06-29 LAB — COMPREHENSIVE METABOLIC PANEL
ALT: 13 U/L (ref 0–35)
AST: 15 U/L (ref 0–37)
Albumin: 4 g/dL (ref 3.5–5.2)
Alkaline Phosphatase: 56 U/L (ref 39–117)
BILIRUBIN TOTAL: 0.8 mg/dL (ref 0.2–1.2)
BUN: 16 mg/dL (ref 6–23)
CHLORIDE: 102 meq/L (ref 96–112)
CO2: 31 meq/L (ref 19–32)
CREATININE: 1.06 mg/dL (ref 0.40–1.20)
Calcium: 9 mg/dL (ref 8.4–10.5)
GFR: 51.98 mL/min — ABNORMAL LOW (ref >60.00)
GLUCOSE: 98 mg/dL (ref 70–99)
Potassium: 4.6 mEq/L (ref 3.5–5.1)
SODIUM: 137 meq/L (ref 135–145)
Total Protein: 6.3 g/dL (ref 6.0–8.3)

## 2017-06-29 LAB — URINALYSIS, ROUTINE W REFLEX MICROSCOPIC
Bilirubin Urine: NEGATIVE
Hgb urine dipstick: NEGATIVE
KETONES UR: NEGATIVE
NITRITE: NEGATIVE
Specific Gravity, Urine: 1.01 (ref 1.000–1.030)
Total Protein, Urine: NEGATIVE
URINE GLUCOSE: NEGATIVE
Urobilinogen, UA: 0.2 (ref 0.0–1.0)
pH: 6.5 (ref 5.0–8.0)

## 2017-06-29 LAB — LIPID PANEL
CHOLESTEROL: 124 mg/dL (ref 0–200)
HDL: 53.4 mg/dL (ref >39.00)
LDL Cholesterol: 55 mg/dL (ref 0–99)
NonHDL: 70.3
TRIGLYCERIDES: 76 mg/dL (ref 0.0–149.0)
Total CHOL/HDL Ratio: 2
VLDL: 15.2 mg/dL (ref 0.0–40.0)

## 2017-06-29 LAB — CBC
HCT: 38.4 % (ref 36.0–46.0)
Hemoglobin: 12.6 g/dL (ref 12.0–15.0)
MCHC: 32.8 g/dL (ref 30.0–36.0)
MCV: 93.1 fl (ref 78.0–100.0)
Platelets: 190 10*3/uL (ref 150.0–400.0)
RBC: 4.12 Mil/uL (ref 3.87–5.11)
RDW: 15.3 % (ref 11.5–15.5)
WBC: 7.1 10*3/uL (ref 4.0–10.5)

## 2017-06-29 LAB — TSH: TSH: 8.9 u[IU]/mL — ABNORMAL HIGH (ref 0.35–4.50)

## 2017-06-29 LAB — VITAMIN D 25 HYDROXY (VIT D DEFICIENCY, FRACTURES): VITD: 79.28 ng/mL (ref 30.00–100.00)

## 2017-07-05 ENCOUNTER — Other Ambulatory Visit: Payer: Self-pay | Admitting: Internal Medicine

## 2017-07-05 DIAGNOSIS — E039 Hypothyroidism, unspecified: Secondary | ICD-10-CM

## 2017-07-05 MED ORDER — LEVOTHYROXINE SODIUM 100 MCG PO TABS: 100.0000 ug | ORAL_TABLET | Freq: Every day | ORAL | 0 refills | Status: DC

## 2017-08-03 ENCOUNTER — Other Ambulatory Visit: Payer: Self-pay | Admitting: *Deleted

## 2017-08-03 DIAGNOSIS — I6529 Occlusion and stenosis of unspecified carotid artery: Secondary | ICD-10-CM

## 2017-08-27 ENCOUNTER — Encounter: Payer: Self-pay | Admitting: Family

## 2017-08-27 DIAGNOSIS — M810 Age-related osteoporosis without current pathological fracture: Secondary | ICD-10-CM | POA: Insufficient documentation

## 2017-08-30 ENCOUNTER — Ambulatory Visit: Payer: Self-pay

## 2017-09-14 ENCOUNTER — Other Ambulatory Visit: Payer: Self-pay | Admitting: Family

## 2017-09-17 ENCOUNTER — Ambulatory Visit: Payer: Medicare Other | Admitting: Family

## 2017-09-17 VITALS — BP 130/84 | HR 64 | Temp 98.4°F | Resp 15 | Wt 147.2 lb

## 2017-09-17 DIAGNOSIS — M810 Age-related osteoporosis without current pathological fracture: Secondary | ICD-10-CM | POA: Diagnosis not present

## 2017-09-17 DIAGNOSIS — I2510 Atherosclerotic heart disease of native coronary artery without angina pectoris: Secondary | ICD-10-CM | POA: Diagnosis not present

## 2017-09-17 DIAGNOSIS — E039 Hypothyroidism, unspecified: Secondary | ICD-10-CM

## 2017-09-17 DIAGNOSIS — R300 Dysuria: Secondary | ICD-10-CM

## 2017-09-17 DIAGNOSIS — K219 Gastro-esophageal reflux disease without esophagitis: Secondary | ICD-10-CM | POA: Diagnosis not present

## 2017-09-17 DIAGNOSIS — I10 Essential (primary) hypertension: Secondary | ICD-10-CM

## 2017-09-17 LAB — BASIC METABOLIC PANEL
BUN: 14 mg/dL (ref 6–23)
CALCIUM: 9.3 mg/dL (ref 8.4–10.5)
CO2: 26 mEq/L (ref 19–32)
Chloride: 97 mEq/L (ref 96–112)
Creatinine, Ser: 1.01 mg/dL (ref 0.40–1.20)
GFR: 54.93 mL/min — AB (ref >60.00)
Glucose, Bld: 94 mg/dL (ref 70–99)
Potassium: 4.3 mEq/L (ref 3.5–5.1)
Sodium: 133 mEq/L — ABNORMAL LOW (ref 135–145)

## 2017-09-17 LAB — TSH: TSH: 3.32 u[IU]/mL (ref 0.35–4.50)

## 2017-09-17 LAB — POC URINALSYSI DIPSTICK (AUTOMATED)
Bilirubin, UA: NEGATIVE
Glucose, UA: NEGATIVE
KETONES UA: NEGATIVE
Nitrite, UA: NEGATIVE
PH UA: 6 (ref 5.0–8.0)
PROTEIN UA: NEGATIVE
RBC UA: NEGATIVE
SPEC GRAV UA: 1.015 (ref 1.010–1.025)
UROBILINOGEN UA: 0.2 U/dL

## 2017-09-17 LAB — URINALYSIS, MICROSCOPIC ONLY: RBC / HPF: NONE SEEN (ref 0–?)

## 2017-09-17 MED ORDER — LOSARTAN POTASSIUM 100 MG PO TABS: 100.0000 mg | ORAL_TABLET | Freq: Every day | ORAL | 1 refills | Status: DC

## 2017-09-17 MED ORDER — AMLODIPINE BESYLATE 5 MG PO TABS: 5.0000 mg | ORAL_TABLET | Freq: Every day | ORAL | 1 refills | Status: DC

## 2017-09-17 MED ORDER — CARVEDILOL 6.25 MG PO TABS: ORAL_TABLET | ORAL | 1 refills | Status: DC

## 2017-09-17 MED ORDER — PRAVASTATIN SODIUM 20 MG PO TABS: 20.0000 mg | ORAL_TABLET | Freq: Every day | ORAL | 0 refills | Status: DC

## 2017-09-17 MED ORDER — LEVOTHYROXINE SODIUM 100 MCG PO TABS: 100.0000 ug | ORAL_TABLET | Freq: Every day | ORAL | 0 refills | Status: DC

## 2017-09-17 NOTE — Progress Notes (Signed)
Subjective:    Patient ID: Kayla [name], female    DOB: 1927-03-30, 84 y.o.   MRN: [id number]  CC: Kayla [name] is a 84 y.o. female who presents today for follow up.   HPI: Complains of dysuria, frequency x one day, beter today. No fever,  Notes recently treated at [facility] walk in with amoxicillin for sinus pain, ear pressure, completed one week ago.  No vaginal discharge, vaginal itching.   OP- Boniva is too expenive ; able to get 3 doses , however completed at . Not interested in infusions or [place].  HLD- compliant with medication  GERD- on PPI at supper. Zantac BID. Notes breakthrough symptoms. Has raised bed which thinks it helping. Per patient, had egd had GERD, no cancer per patient. Unable to record.  No longer does colonoscopy. No trouble swallowing, blood in stool, weight loss.   HTN- compliant with medication. Denies exertional chest pain or pressure, numbness or tingling radiating to left arm or jaw, palpitations, dizziness, frequent headaches, changes in vision, or shortness of breath.   Known AS.    Would like 90 supply on all medications.   Not using the meclizine.        Saw [facility] 06/2017 HISTORY:  Past Medical History:  Diagnosis Date  . Anginal pain (HCC)   . Arthritis   . Bundle branch block, left    Chronic  . CAD (coronary artery disease)   . Cholecystitis    from record  . Emphysema lung (HCC)   . GERD (gastroesophageal reflux disease)   . Heart murmur   . HOH (hard of hearing)   . Hyperlipidemia   . Hypertension   . Hypothyroidism   . Orthostatic headache    Mild  . Pain    HIP AND LEG PAIN USES  CANE  . Vertigo    Past Surgical History:  Procedure Laterality Date  . APPENDECTOMY  1957  . BREAST BIOPSY Left 2011   neg, stereo done by Dr. Evette Cristal  . BREAST BIOPSY    . CARDIAC CATHETERIZATION  2000   [facility]  . CATARACT EXTRACTION Right    right 2004, left 2018   . CATARACT EXTRACTION W/PHACO Left 04/12/2017   Procedure: CATARACT  EXTRACTION PHACO AND INTRAOCULAR LENS PLACEMENT (IOC);  Surgeon: Lockie Mola, MD;  Location: [facility] ORS;  Service: Ophthalmology;  Laterality: Left;  [place] 01:09.0AP% 23.7CDE 16.35FLUID PACK LOT # [id number] H  . CHOLECYSTECTOMY N/A 04/25/2016   Procedure: LAPAROSCOPIC CHOLECYSTECTOMY WITH INTRAOPERATIVE CHOLANGIOGRAM;  Surgeon: Kieth Brightly, MD;  Location: [facility] ORS;  Service: General;  Laterality: N/A;  . CORONARY ANGIOPLASTY     STENT  . CORONARY ARTERY BYPASS GRAFT  2001  . KNEE SURGERY    . thyroid  1970  . UPPER GI ENDOSCOPY  2014   Family History  Problem Relation Age of Onset  . Heart failure Mother 64  . Heart failure Sister 18  . Heart attack Sister   . Heart attack Brother 42    Allergies: Pollen extract and Motrin [ibuprofen] Current Outpatient Medications on File Prior to Visit  Medication Sig Dispense Refill  . aspirin 81 MG tablet Take 81 mg by mouth daily.      . Cholecalciferol (VITAMIN D) 2000 UNITS tablet Take 2,000 Units by mouth daily.    . Cyanocobalamin (VITAMIN B 12 PO) Take 3,000 Units by mouth daily.    . fluticasone (FLONASE) 50 MCG/ACT nasal spray Place 2 sprays into both nostrils daily. 16 g  3  . ibandronate (BONIVA) 150 MG tablet Take 1 tablet (150 mg total) by mouth every 30 (thirty) days. Take in the morning with a full glass of water, on an empty stomach, and do not take anything else by mouth or lie down for the next 60 min. 3 tablet 4  . omeprazole (PRILOSEC) 40 MG capsule TAKE 1 CAPSULE BY MOUTH  EVERY EVENING 90 capsule 1  . ranitidine (ZANTAC) 150 MG tablet TAKE 1 TABLET BY MOUTH TWO  TIMES DAILY 180 tablet 1   No current facility-administered medications on file prior to visit.     Social History   Tobacco Use  . Smoking status: Never Smoker  . Smokeless tobacco: Never Used  Substance Use Topics  . Alcohol use: No  . Drug use: No    Review of Systems  Constitutional: Negative for chills and fever.  Respiratory: Negative for  cough.   Cardiovascular: Negative for chest pain and palpitations.  Gastrointestinal: Negative for nausea and vomiting.  Genitourinary: Positive for dysuria and frequency. Negative for flank pain and hematuria.      Objective:    BP 130/84 (BP Location: Left Arm, Patient Position: Sitting, Cuff Size: Normal)   Pulse 64   Temp 98.4 F (36.9 C) (Oral)   Resp 15   Wt 147 lb 4 oz (66.8 kg)   SpO2 99%   BMI 27.82 kg/m  BP Readings from Last 3 Encounters:  09/17/17 130/84  06/19/17 128/78  04/12/17 (!) 153/58   Wt Readings from Last 3 Encounters:  09/17/17 147 lb 4 oz (66.8 kg)  06/19/17 159 lb 6 oz (72.3 kg)  04/12/17 158 lb (71.7 kg)    Physical Exam  Constitutional: She appears well-developed and well-nourished.  Cardiovascular: Normal rate, regular rhythm and normal pulses.  Murmur heard.  Systolic murmur is present with a grade of 2/6. SEM II/VI, Loudest LSB, non radiating, no thrill   Pulmonary/Chest: Effort normal and breath sounds normal. She has no wheezes. She has no rhonchi. She has no rales.  Abdominal: There is no CVA tenderness.  Neurological: She is alert.  Skin: Skin is warm and dry.  Psychiatric: She has a normal mood and affect. Her speech is normal and behavior is normal. Thought content normal.  Vitals reviewed.      Assessment & Plan:   Problem List Items Addressed This Visit      Cardiovascular and Mediastinum   Essential hypertension    Doing well on medication. Continue regimen.       Relevant Medications   carvedilol (COREG) 6.25 MG tablet   amLODipine (NORVASC) 5 MG tablet   losartan (COZAAR) 100 MG tablet   pravastatin (PRAVACHOL) 20 MG tablet   Other Relevant Orders   Basic metabolic panel     Digestive   GERD (gastroesophageal reflux disease)    Breakthrough symptoms on medications. Agreed she was due for EGD. Ordered. Declines further colonoscopy.       Relevant Orders   Ambulatory referral to Gastroenterology     Endocrine     Hypothyroidism   Relevant Medications   carvedilol (COREG) 6.25 MG tablet   levothyroxine (SYNTHROID, LEVOTHROID) 100 MCG tablet   Other Relevant Orders   TSH     Musculoskeletal and Integument   Osteoporosis    Will see if pharmacist can find cheaper option of Boniva. She declines in injections/infusions for OP.         Other   Dysuria - Primary  Trace leukocytes. Negative blood. Patient and I agreed to await urine culture.       Relevant Orders   POCT Urinalysis Dipstick (Automated) (Completed)   Urine Culture   Urine Microscopic    Other Visit Diagnoses    Coronary artery disease involving native heart without angina pectoris, unspecified vessel or lesion type       Relevant Medications   carvedilol (COREG) 6.25 MG tablet   amLODipine (NORVASC) 5 MG tablet   losartan (COZAAR) 100 MG tablet   pravastatin (PRAVACHOL) 20 MG tablet       I have discontinued Ruhani W. Ausmus's meclizine. I have also changed her losartan. Additionally, I am having her maintain her aspirin, Cyanocobalamin (VITAMIN B 12 PO), Vitamin D, fluticasone, ibandronate, ranitidine, omeprazole, carvedilol, amLODipine, levothyroxine, and pravastatin.   Meds ordered this encounter  Medications  . carvedilol (COREG) 6.25 MG tablet    Sig: One pill twice a day    Dispense:  240 tablet    Refill:  1  . amLODipine (NORVASC) 5 MG tablet    Sig: Take 1 tablet (5 mg total) by mouth daily.    Dispense:  90 tablet    Refill:  1    Generic ok  . levothyroxine (SYNTHROID, LEVOTHROID) 100 MCG tablet    Sig: Take 1 tablet (100 mcg total) by mouth daily before breakfast.    Dispense:  90 tablet    Refill:  0    Generic ok. Note increase dose  . losartan (COZAAR) 100 MG tablet    Sig: Take 1 tablet (100 mg total) by mouth daily.    Dispense:  90 tablet    Refill:  1  . pravastatin (PRAVACHOL) 20 MG tablet    Sig: Take 1 tablet (20 mg total) by mouth at bedtime.    Dispense:  90 tablet    Refill:  0     Generic ok    Return precautions given.   Risks, benefits, and alternatives of the medications and treatment plan prescribed today were discussed, and patient expressed understanding.   Education regarding symptom management and diagnosis given to patient on AVS.  Continue to follow with Allegra GranaArnett, Darryl Blumenstein G, FNP for routine health maintenance.   Kayla EchevariaMargie W Kroenke and I agreed with plan.   Rennie PlowmanMargaret Layal Javid, FNP

## 2017-09-17 NOTE — Assessment & Plan Note (Signed)
Breakthrough symptoms on medications. Agreed she was due for EGD. Ordered. Declines further colonoscopy.

## 2017-09-17 NOTE — Assessment & Plan Note (Signed)
Trace leukocytes. Negative blood. Patient and I agreed to await urine culture.

## 2017-09-17 NOTE — Assessment & Plan Note (Signed)
Will see if pharmacist can find cheaper option of Boniva. She declines in injections/infusions for OP.

## 2017-09-17 NOTE — Patient Instructions (Signed)
Labs today  We will see about boniva and cost.   Pleasure seeing you!

## 2017-09-17 NOTE — Assessment & Plan Note (Signed)
Doing well on medication. Continue regimen.

## 2017-09-18 LAB — URINE CULTURE
MICRO NUMBER:: 90275428
SPECIMEN QUALITY:: ADEQUATE

## 2017-09-19 ENCOUNTER — Ambulatory Visit (INDEPENDENT_AMBULATORY_CARE_PROVIDER_SITE_OTHER): Payer: Medicare Other

## 2017-09-19 DIAGNOSIS — I6529 Occlusion and stenosis of unspecified carotid artery: Secondary | ICD-10-CM

## 2017-09-21 ENCOUNTER — Ambulatory Visit: Payer: Medicare Other | Admitting: Internal Medicine

## 2017-09-21 ENCOUNTER — Telehealth: Payer: Self-pay | Admitting: Family

## 2017-09-21 ENCOUNTER — Other Ambulatory Visit: Payer: Self-pay | Admitting: Family

## 2017-09-21 ENCOUNTER — Encounter: Payer: Self-pay | Admitting: Internal Medicine

## 2017-09-21 VITALS — BP 160/80 | HR 64 | Temp 97.9°F | Resp 14 | Ht 61.0 in | Wt 158.0 lb

## 2017-09-21 DIAGNOSIS — E871 Hypo-osmolality and hyponatremia: Secondary | ICD-10-CM

## 2017-09-21 DIAGNOSIS — R102 Pelvic and perineal pain: Secondary | ICD-10-CM | POA: Diagnosis not present

## 2017-09-21 DIAGNOSIS — R350 Frequency of micturition: Secondary | ICD-10-CM

## 2017-09-21 LAB — BASIC METABOLIC PANEL
BUN: 11 mg/dL (ref 6–23)
CO2: 28 mEq/L (ref 19–32)
Calcium: 9.5 mg/dL (ref 8.4–10.5)
Chloride: 98 mEq/L (ref 96–112)
Creatinine, Ser: 0.94 mg/dL (ref 0.40–1.20)
GFR: 59.67 mL/min — ABNORMAL LOW (ref >60.00)
Glucose, Bld: 103 mg/dL — ABNORMAL HIGH (ref 70–99)
Potassium: 4.1 mEq/L (ref 3.5–5.1)
SODIUM: 133 meq/L — AB (ref 135–145)

## 2017-09-21 LAB — POCT URINALYSIS DIPSTICK
BILIRUBIN UA: NEGATIVE
GLUCOSE UA: NEGATIVE
KETONES UA: NEGATIVE
Nitrite, UA: NEGATIVE
PH UA: 5.5 (ref 5.0–8.0)
SPEC GRAV UA: 1.025 (ref 1.010–1.025)
UROBILINOGEN UA: 0.2 U/dL

## 2017-09-21 LAB — URINALYSIS, MICROSCOPIC ONLY

## 2017-09-21 MED ORDER — SOLIFENACIN SUCCINATE 5 MG PO TABS: 5.0000 mg | ORAL_TABLET | Freq: Every day | ORAL | 2 refills | Status: DC

## 2017-09-21 MED ORDER — SULFAMETHOXAZOLE-TRIMETHOPRIM 800-160 MG PO TABS: 1.0000 | ORAL_TABLET | Freq: Two times a day (BID) | ORAL | 0 refills | Status: DC

## 2017-09-21 NOTE — Telephone Encounter (Signed)
Confirm at 2pm  -  She should only be on pravachol, not lovastatin

## 2017-09-21 NOTE — Patient Instructions (Addendum)
I am treating you for a urinary tract infection based on today's sample and your worsening symptoms. The meidcation is called Sulfamethoxazole (Septra) . Take it 2 times daily for 5 days.     Once your infection has been treated,  You can start the NEW MEDICATION I am  prescribing  for OVERACTIVE BLADDER.   It is called solifenacin  ("Vesicare").   Take it once daily in the evening.  We can increase the dose to 10 mg if no improvement in 10 days   Start taking your colace (stool softener)  Every night instead of every other night because constipation can make urinary frequency worse   Overactive Bladder, Adult Overactive bladder is a group of urinary symptoms. With overactive bladder, you may suddenly feel the need to pass urine (urinate) right away. After feeling this sudden urge, you might also leak urine if you cannot get to the bathroom fast enough (urinary incontinence). These symptoms might interfere with your daily work or social activities. Overactive bladder symptoms may also wake you up at night. Overactive bladder affects the nerve signals between your bladder and your brain. Your bladder may get the signal to empty before it is full. Very sensitive muscles can also make your bladder squeeze too soon. What are the causes? Many things can cause an overactive bladder. Possible causes include:  Urinary tract infection.  Infection of nearby tissues, such as the prostate.  Prostate enlargement.  Being pregnant with twins or more (multiples).  Surgery on the uterus or urethra.  Bladder stones, inflammation, or tumors.  Drinking too much caffeine or alcohol.  Certain medicines, especially those that you take to help your body get rid of extra fluid (diuretics) by increasing urine production.  Muscle or nerve weakness, especially from: ? A spinal cord injury. ? Stroke. ? Multiple sclerosis. ? Parkinson disease.  Diabetes. This can cause a high urine volume that fills the bladder  so quickly that the normal urge to urinate is triggered very strongly.  Constipation. A buildup of too much stool can put pressure on your bladder.  What increases the risk? You may be at greater risk for overactive bladder if you:  Are an older adult.  Smoke.  Are going through menopause.  Have prostate problems.  Have a neurological disease, such as stroke, dementia, Parkinson disease, or multiple sclerosis (MS).  Eat or drink things that irritate the bladder. These include alcohol, spicy food, and caffeine.  Are overweight or obese.  What are the signs or symptoms? The signs and symptoms of an overactive bladder include:  Sudden, strong urges to urinate.  Leaking urine.  Urinating eight or more times per day.  Waking up to urinate two or more times per night.  How is this diagnosed? Your health care provider may suspect overactive bladder based on your symptoms. The health care provider will do a physical exam and take your medical history. Blood or urine tests may also be done. For example, you might need to have a bladder function test to check how well you can hold your urine. You might also need to see a health care provider who specializes in the urinary tract (urologist). How is this treated? Treatment for overactive bladder depends on the cause of your condition and whether it is mild or severe. Certain treatments can be done in your health care provider's office or clinic. You can also make lifestyle changes at home. Options include: Behavioral Treatments  Biofeedback. A specialist uses sensors to help  you become aware of your body's signals.  Keeping a daily log of when you need to urinate and what happens after the urge. This may help you manage your condition.  Bladder training. This helps you learn to control the urge to urinate by following a schedule that directs you to urinate at regular intervals (timed voiding). At first, you might have to wait a few  minutes after feeling the urge. In time, you should be able to schedule bathroom visits an hour or more apart.  Kegel exercises. These are exercises to strengthen the pelvic floor muscles, which support the bladder. Toning these muscles can help you control urination, even if your bladder muscles are overactive. A specialist will teach you how to do these exercises correctly. They require daily practice.  Weight loss. If you are obese or overweight, losing weight might relieve your symptoms of overactive bladder. Talk to your health care provider about losing weight and whether there is a specific program or method that would work best for you.  Diet change. This might help if constipation is making your overactive bladder worse. Your health care provider or a dietitian can explain ways to change what you eat to ease constipation. You might also need to consume less alcohol and caffeine or drink other fluids at different times of the day.  Stopping smoking.  Wearing pads to absorb leakage while you wait for other treatments to take effect. Physical Treatments  Electrical stimulation. Electrodes send gentle pulses of electricity to strengthen the nerves or muscles that help to control the bladder. Sometimes, the electrodes are placed outside of the body. In other cases, they might be placed inside the body (implanted). This treatment can take several months to have an effect.  Supportive devices. Women may need a plastic device that fits into the vagina and supports the bladder (pessary). Medicines Several medicines can help treat overactive bladder and are usually used along with other treatments. Some are injected into the muscles involved in urination. Others come in pill form. Your health care provider may prescribe:  Antispasmodics. These medicines block the signals that the nerves send to the bladder. This keeps the bladder from releasing urine at the wrong time.  Tricyclic antidepressants.  These types of antidepressants also relax bladder muscles.  Surgery  You may have a device implanted to help manage the nerve signals that indicate when you need to urinate.  You may have surgery to implant electrodes for electrical stimulation.  Sometimes, very severe cases of overactive bladder require surgery to change the shape of the bladder. Follow these instructions at home:  Take medicines only as directed by your health care provider.  Use any implants or a pessary as directed by your health care provider.  Make any diet or lifestyle changes that are recommended by your health care provider. These might include: ? Drinking less fluid or drinking at different times of the day. If you need to urinate often during the night, you may need to stop drinking fluids early in the evening. ? Cutting down on caffeine or alcohol. Both can make an overactive bladder worse. Caffeine is found in coffee, tea, and sodas. ? Doing Kegel exercises to strengthen muscles. ? Losing weight if you need to. ? Eating a healthy and balanced diet to prevent constipation.  Keep a journal or log to track how much and when you drink and also when you feel the need to urinate. This will help your health care provider to monitor your  condition. Contact a health care provider if:  Your symptoms do not get better after treatment.  Your pain and discomfort are getting worse.  You have more frequent urges to urinate.  You have a fever. Get help right away if: You are not able to control your bladder at all. This information is not intended to replace advice given to you by your health care provider. Make sure you discuss any questions you have with your health care provider. Document Released: 04/29/2009 Document Revised: 12/09/2015 Document Reviewed: 11/26/2013 Elsevier Interactive Patient Education  [employer].

## 2017-09-21 NOTE — Progress Notes (Signed)
Subjective:  Patient ID: Kayla [name], female    DOB: 11/16/1926  Age: 84 y.o. MRN: [id number]  CC: The primary encounter diagnosis was Suprapubic pressure. Diagnoses of Hyponatremia and Urinary frequency were also pertinent to this visit.  HPI Kayla [name] presents for urinary  symptoms.  UTI was ruled out  on Monday after two days of frequency (urination occurring every Hour) .  last night she had nocturia every 15 minutes  . accompanied by the feeling that "her bladder was going to fall out".  Denies seeing a bulge,  Reports a feeling of recurrent  suprapubic pressure .   History of urge incontinence. Never treated. Constipated chronically for the last ten years was regular prior to that.  No recent colonoscopy ,  Takes colace every other night and has a BM twice on the day after BM, none the next day    6 vaginal deliveries     Outpatient Medications Prior to Visit  Medication Sig Dispense Refill  . amLODipine (NORVASC) 5 MG tablet Take 1 tablet (5 mg total) by mouth daily. 90 tablet 1  . aspirin 81 MG tablet Take 81 mg by mouth daily.      . carvedilol (COREG) 6.25 MG tablet One pill twice a day 240 tablet 1  . Cholecalciferol (VITAMIN D) 2000 UNITS tablet Take 2,000 Units by mouth daily.    . Cyanocobalamin (VITAMIN B 12 PO) Take 3,000 Units by mouth daily.    . fluticasone (FLONASE) 50 MCG/ACT nasal spray Place 2 sprays into both nostrils daily. 16 g 3  . levothyroxine (SYNTHROID, LEVOTHROID) 100 MCG tablet Take 1 tablet (100 mcg total) by mouth daily before breakfast. 90 tablet 0  . losartan (COZAAR) 100 MG tablet Take 1 tablet (100 mg total) by mouth daily. 90 tablet 1  . omeprazole (PRILOSEC) 40 MG capsule TAKE 1 CAPSULE BY MOUTH  EVERY EVENING 90 capsule 1  . pravastatin (PRAVACHOL) 20 MG tablet Take 1 tablet (20 mg total) by mouth at bedtime. 90 tablet 0  . ranitidine (ZANTAC) 150 MG tablet TAKE 1 TABLET BY MOUTH TWO  TIMES DAILY 180 tablet 1  . ibandronate (BONIVA) 150  MG tablet Take 1 tablet (150 mg total) by mouth every 30 (thirty) days. Take in the morning with a full glass of water, on an empty stomach, and do not take anything else by mouth or lie down for the next 60 min. (Patient not taking: Reported on 09/21/2017) 3 tablet 4   No facility-administered medications prior to visit.     Review of Systems;  Patient denies headache, fevers, malaise, unintentional weight loss, skin rash, eye pain, sinus congestion and sinus pain, sore throat, dysphagia,  hemoptysis , cough, dyspnea, wheezing, chest pain, palpitations, orthopnea, edema, nausea, melena, diarrhea, constipation, flank pain, dysuria, hematuria,  numbness, tingling, seizures,  Focal weakness, Loss of consciousness,  Tremor, insomnia, depression, anxiety, and suicidal ideation.      Objective:  BP (!) 160/80 (BP Location: Left Arm, Patient Position: Sitting, Cuff Size: Normal)   Pulse 64   Temp 97.9 F (36.6 C) (Oral)   Resp 14   Ht 5\' 1"  (1.549 m)   Wt 158 lb (71.7 kg)   SpO2 98%   BMI 29.85 kg/m   BP Readings from Last 3 Encounters:  09/21/17 (!) 160/80  09/17/17 130/84  06/19/17 128/78    Wt Readings from Last 3 Encounters:  09/21/17 158 lb (71.7 kg)  09/17/17 147 lb 4 oz (66.8  kg)  06/19/17 159 lb 6 oz (72.3 kg)    General appearance: alert, cooperative and appears stated age Ears: normal TM's and external ear canals both ears Throat: lips, mucosa, and tongue normal; teeth and gums normal Neck: no adenopathy, no carotid bruit, supple, symmetrical, trachea midline and thyroid not enlarged, symmetric, no tenderness/mass/nodules Back: symmetric, no curvature. ROM normal. No CVA tenderness. Lungs: clear to auscultation bilaterally Heart: regular rate and rhythm, S1, S2 normal, no murmur, click, rub or gallop Abdomen: soft, non-tender; bowel sounds normal; no masses,  no organomegaly Pelvic:  Vaginal vault atrophic,  No discharge,  No sings of prolapse Pulses: 2+ and  symmetric Skin: Skin color, texture, turgor normal. No rashes or lesions Lymph nodes: Cervical, supraclavicular, and axillary nodes normal.  No results found for: HGBA1C  Lab Results  Component Value Date   CREATININE 0.94 09/21/2017   CREATININE 1.01 09/17/2017   CREATININE 1.06 06/29/2017    Lab Results  Component Value Date   WBC 7.1 06/29/2017   HGB 12.6 06/29/2017   HCT 38.4 06/29/2017   PLT 190.0 06/29/2017   GLUCOSE 103 (H) 09/21/2017   CHOL 124 06/29/2017   TRIG 76.0 06/29/2017   HDL 53.40 06/29/2017   LDLCALC 55 06/29/2017   ALT 13 06/29/2017   AST 15 06/29/2017   NA 133 (L) 09/21/2017   K 4.1 09/21/2017   CL 98 09/21/2017   CREATININE 0.94 09/21/2017   BUN 11 09/21/2017   CO2 28 09/21/2017   TSH 3.32 09/17/2017   INR 0.95 09/14/2012    No results found.  Assessment & Plan:   Problem List Items Addressed This Visit    Hyponatremia   Urinary frequency    Progression over last several days with pyuria on UA . No vaginal prolapse  On gyn exam today.  Empiric treatment with Septra DS . Culture was negative.  Starting [employer]. If symptoms persist,  Advised to see Urology        Other Visit Diagnoses    Suprapubic pressure    -  Primary   Relevant Orders   POCT urinalysis dipstick (Completed)   Urine Microscopic Only (Completed)   Urine Culture (Completed)    A total of 25 minutes of face to face time was spent with patient more than half of which was spent in counselling about the above mentioned conditions  and coordination of care   I have discontinued Chosen W. Jenison's ibandronate. I am also having her start on solifenacin and sulfamethoxazole-trimethoprim. Additionally, I am having her maintain her aspirin, Cyanocobalamin (VITAMIN B 12 PO), Vitamin D, fluticasone, ranitidine, omeprazole, carvedilol, amLODipine, levothyroxine, losartan, and pravastatin.  Meds ordered this encounter  Medications  . solifenacin (VESICARE) 5 MG tablet    Sig: Take 1  tablet (5 mg total) by mouth daily.    Dispense:  30 tablet    Refill:  2  . sulfamethoxazole-trimethoprim (BACTRIM DS,SEPTRA DS) 800-160 MG tablet    Sig: Take 1 tablet by mouth 2 (two) times daily.    Dispense:  10 tablet    Refill:  0    Medications Discontinued During This Encounter  Medication Reason  . ibandronate (BONIVA) 150 MG tablet Cost of medication    Follow-up: No Follow-up on file.   Sherlene Shams, MD

## 2017-09-22 LAB — URINE CULTURE
MICRO NUMBER: 90300104
SPECIMEN QUALITY:: ADEQUATE

## 2017-09-23 DIAGNOSIS — R350 Frequency of micturition: Secondary | ICD-10-CM | POA: Insufficient documentation

## 2017-09-23 NOTE — Assessment & Plan Note (Addendum)
Progression over last several days with pyuria on UA . No vaginal prolapse  On gyn exam today.  Empiric treatment with Septra DS . Culture was negative.  Starting [employer]es[employer]. If symptoms persist,  Advised to see Urology

## 2017-09-24 ENCOUNTER — Telehealth: Payer: Self-pay | Admitting: Family

## 2017-09-24 NOTE — Telephone Encounter (Signed)
Patient had been on boniva for osteoporosis  She states too expensive  Can we confirm with [employer] that she was on generic?   Generic is ibandronate.

## 2017-09-25 NOTE — Telephone Encounter (Addendum)
Patient will check formulary for osteoporosis medication and call us back she is currently taking generic .  Patient is wanting lab results from 09/21/17 .   I spoke with [facility] they state that Fosamax is covered under Tier 1

## 2017-09-26 ENCOUNTER — Other Ambulatory Visit: Payer: Self-pay | Admitting: Family

## 2017-09-26 NOTE — Telephone Encounter (Signed)
Would pt be willing to start fosamax?  She would be UNABLE  to take if history of esophageal stricture, trouble swallowing, if her kidney function worsens  Medication can be associated with atypical hip fracture and jaw necrosis. If any dental work in the new future, advise her not to start medication. If she has concerns with possible side effects of medication, we can discuss in OV.     Start Fosamax ( alendronate).   This medication is a biphosphonate to help preserve/maintain bone loss. It reduces bone turnover.   Take Fosamax with water, 30 minutes prior to first food/drink/medication. Avoid laying down for 30 minutes.  Stay on for about 5 years and then we reassess if need to take a 'drug holiday'  OTHER Recommendations ( may print for patient):  For post menopausal women, guidelines recommend a diet with 1200 mg of Calcium per day. If you are eating calcium rich foods, you do not need a calcium supplement. The body better absorbs the calcium that you eat over supplementation. If you do supplement, I recommend not supplementing the full 1200 mg/ day as this can lead to increased risk of cardiovascular disease. I recommend Calcium Citrate over the counter, and you may take a total of 600 to 800 mg per day in divided doses with meals for best absorption.   For bone health, you need adequate vitamin D, and I recommend you supplement as it is harder to do so with diet alone. I recommend cholecalciferol 800 units daily.  Also, please ensure you are following a diet high in calcium -- research shows better outcomes with dietary sources including kale, yogurt, broccolii, cheese, okra, almonds- to name a few.     Also remember that exercise is a great medicine for maintain and preserve bone health. Advise moderate exercise for 30 minutes , 3 times per week.

## 2017-09-28 NOTE — Telephone Encounter (Signed)
Spoke with patient would like to discuss at next office visit in detail below side effects

## 2017-09-28 NOTE — Telephone Encounter (Signed)
noted 

## 2017-10-10 ENCOUNTER — Encounter: Payer: Self-pay | Admitting: Gastroenterology

## 2017-10-10 ENCOUNTER — Ambulatory Visit: Payer: Medicare Other | Admitting: Gastroenterology

## 2017-10-10 VITALS — BP 170/79 | HR 64 | Ht 61.0 in | Wt 156.0 lb

## 2017-10-10 DIAGNOSIS — K219 Gastro-esophageal reflux disease without esophagitis: Secondary | ICD-10-CM | POA: Diagnosis not present

## 2017-10-10 NOTE — Progress Notes (Signed)
Kayla MoodKiran Marcella Dunnaway MD, MRCP(U.K) [address]  [address]  [place], [place] [address]  Main: [phone]  Fax: [phone]   Gastroenterology Consultation  Referring Provider:     Allegra GranaArnett, Kayla [name], [name] Primary Care Physician:  Kayla [name] Primary Gastroenterologist:  Dr. [name]  Reason for Consultation:     GERD         HPI:   Kayla [name] is a 84 y.o. y/o [name] referred for consultation & management  by Dr. Jason CoopArnett, Kayla [name].    She has been referred for gastroesophageal reflux disease .   Reflux:  Onset : Many years  Symptoms: If she has greasy foods , feels the food comes back up , heartburn  Recent weight gain: no change- no recent change in GERD symptoms  Medications: Zantac BID and well controlled  Narcotics or anticholinergics use : no  PPI /H2 blockers or Antacid  use and timing :no - only takes it when she has worsening of symptoms  Dinner time : 5 30 pm , goes to bed around 730 pm sitting up in between   Prior EGD: yes by Dr Bluford Kaufmannh  Family history of esophageal cancer:no   Not a smoker.      Past Medical History:  Diagnosis Date  . Anginal pain (HCC)   . Arthritis   . Bundle branch block, left    Chronic  . CAD (coronary artery disease)   . Cholecystitis    from record  . Emphysema lung (HCC)   . GERD (gastroesophageal reflux disease)   . Heart murmur   . HOH (hard of hearing)   . Hyperlipidemia   . Hypertension   . Hypothyroidism   . Orthostatic headache    Mild  . Pain    HIP AND LEG PAIN USES  CANE  . Vertigo     Past Surgical History:  Procedure Laterality Date  . APPENDECTOMY  1957  . BREAST BIOPSY Left 2011   neg, stereo done by Dr. Evette CristalSankar  . BREAST BIOPSY    . CARDIAC CATHETERIZATION  2000   [facility]  . CATARACT EXTRACTION Right    right 2004, left 2018   . CATARACT EXTRACTION W/PHACO Left 04/12/2017   Procedure: CATARACT EXTRACTION PHACO AND INTRAOCULAR LENS PLACEMENT (IOC);  Surgeon: Lockie MolaBrasington, Chadwick,  MD;  Location: [facility] ORS;  Service: Ophthalmology;  Laterality: Left;  US 01:09.0AP% 23.7CDE 16.35FLUID PACK LOT # [id number]  . CHOLECYSTECTOMY N/A 04/25/2016   Procedure: LAPAROSCOPIC CHOLECYSTECTOMY WITH INTRAOPERATIVE CHOLANGIOGRAM;  Surgeon: Kieth BrightlySeeplaputhur [name] Sankar, MD;  Location: [facility] ORS;  Service: General;  Laterality: N/A;  . CORONARY ANGIOPLASTY     STENT  . CORONARY ARTERY BYPASS GRAFT  2001  . KNEE SURGERY    . thyroid  1970  . UPPER GI ENDOSCOPY  2014    Prior to Admission medications   Medication Sig Start Date End Date Taking? Authorizing Provider  amLODipine (NORVASC) 5 MG tablet Take 1 tablet (5 mg total) by mouth daily. 09/17/17   Kayla [name]  aspirin 81 MG tablet Take 81 mg by mouth daily.      [provider]  carvedilol (COREG) 6.25 MG tablet One pill twice a day 09/17/17   Kayla [name]  Cholecalciferol (VITAMIN D) 2000 UNITS tablet Take 2,000 Units by mouth daily.    [provider]  Cyanocobalamin (VITAMIN B 12 PO) Take 3,000 Units by mouth daily.    [provider]  fluticasone (  FLONASE) 50 MCG/ACT nasal spray Place 2 sprays into both nostrils daily. 12/12/16   Kayla Grana, [name]  levothyroxine (SYNTHROID, LEVOTHROID) 100 MCG tablet Take 1 tablet (100 mcg total) by mouth daily before breakfast. 09/17/17   Kayla Grana, [name]  losartan (COZAAR) 100 MG tablet Take 1 tablet (100 mg total) by mouth daily. 09/17/17   Kayla Grana, [name]  omeprazole (PRILOSEC) 40 MG capsule TAKE 1 CAPSULE BY MOUTH  EVERY EVENING 09/14/17   Kayla Grana, [name]  pravastatin (PRAVACHOL) 20 MG tablet Take 1 tablet (20 mg total) by mouth at bedtime. 09/17/17   Kayla Grana, [name]  ranitidine (ZANTAC) 150 MG tablet TAKE 1 TABLET BY MOUTH TWO  TIMES DAILY 06/27/17   Kayla Grana, [name]  solifenacin (VESICARE) 5 MG tablet Take 1 tablet (5 mg total) by mouth daily. 09/21/17   Sherlene Shams, MD  sulfamethoxazole-trimethoprim (BACTRIM DS,SEPTRA  DS) 800-160 MG tablet Take 1 tablet by mouth 2 (two) times daily. 09/21/17   Sherlene Shams, MD    Family History  Problem Relation Age of Onset  . Heart failure Mother 52  . Heart failure Sister 1  . Heart attack Sister   . Heart attack Brother 45     Social History   Tobacco Use  . Smoking status: Never Smoker  . Smokeless tobacco: Never Used  Substance Use Topics  . Alcohol use: No  . Drug use: No    Allergies as of 10/10/2017 - Review Complete 09/21/2017  Allergen Reaction Noted  . Bee pollen  08/29/2017  . Pollen extract    . Motrin [ibuprofen] Rash 04/17/2016    Review of Systems:    All systems reviewed and negative except where noted in HPI.   Physical Exam:  There were no vitals taken for this visit. No LMP recorded. Patient is postmenopausal. Psych:  Alert and cooperative. Normal mood and affect. General:   Alert,  Well-developed, well-nourished, pleasant and cooperative in NAD Head:  Normocephalic and atraumatic. Eyes:  Sclera clear, no icterus.   Conjunctiva pink. Ears:  Normal auditory acuity. Nose:  No deformity, discharge, or lesions. Mouth:  No deformity or lesions,oropharynx pink & moist. Neck:  Supple; no masses or thyromegaly. Lungs:  Respirations even and unlabored.  Clear throughout to auscultation.   No wheezes, crackles, or rhonchi. No acute distress. Heart:  Regular rate and rhythm; no murmurs, clicks, rubs, or gallops. Abdomen:  Normal bowel sounds.  No bruits.  Soft, non-tender and non-distended without masses, hepatosplenomegaly or hernias noted.  No guarding or rebound tenderness.    Neurologic:  Alert and oriented x3;  grossly normal neurologically. Skin:  Intact without significant lesions or rashes. No jaundice. Lymph Nodes:  No significant cervical adenopathy. Psych:  Alert and cooperative. Normal mood and affect.  Imaging Studies: No results found.  Assessment and Plan:   Kayla [name] is a 84 y.o. y/o [name] has been referred  for GERD. Counseled on life style changes, . Advised on the use of a wedge pillow at night , avoid meals for 2 hours prior to bed time. Presently symptoms well controlled on Zantac. Discussed at her age screening for Barrettes esophagus is not recommended. Risks of procedure exceed long term benefits due to age. As she is asymptomatic will suggest to continue present treatment . Patient information on GERD provided    Follow up PRN  Dr Kayla Mood MD,MRCP(U.K)

## 2017-11-04 IMAGING — US US ABDOMEN LIMITED
1 series · 14 of 25 positions shown · non-contrast
Comparison: None.

CLINICAL DATA: 88-year-old female with right upper quadrant
tenderness and vomiting since this morning. Initial encounter.

EXAM:
US ABDOMEN LIMITED - RIGHT UPPER QUADRANT

[Series 1: us abdomen limited · 0.19mm/px · 14 of 50 slices shown]
[im 1/50]
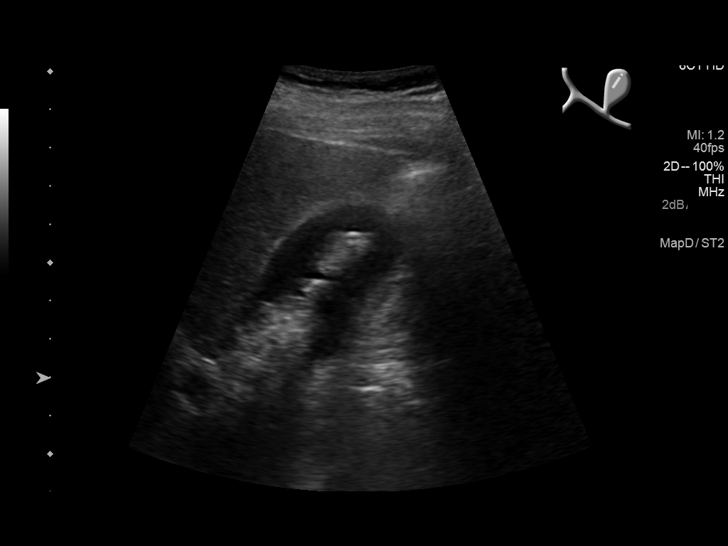
[im 5/50]
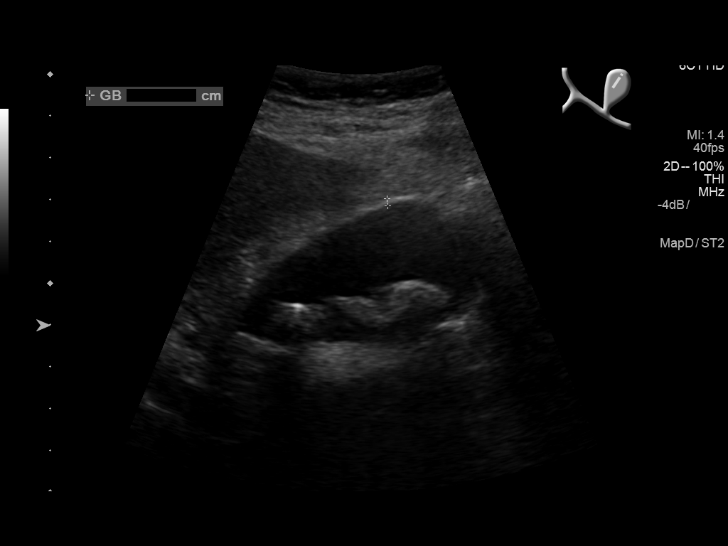
[im 9/50]
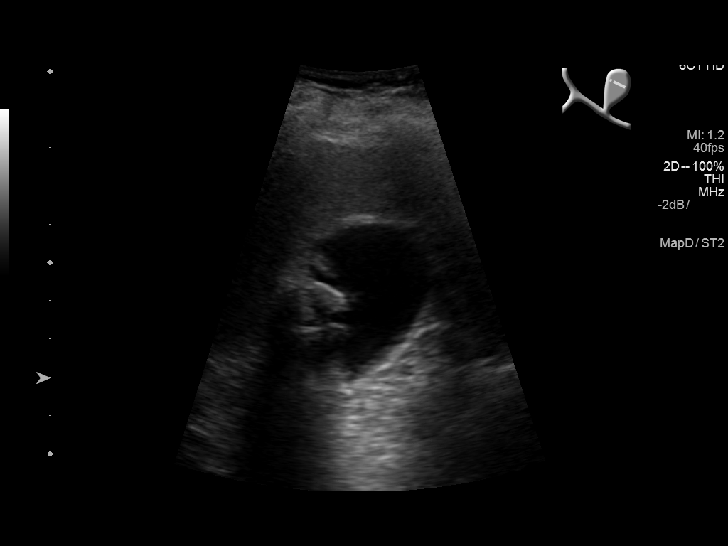
[im 13/50]
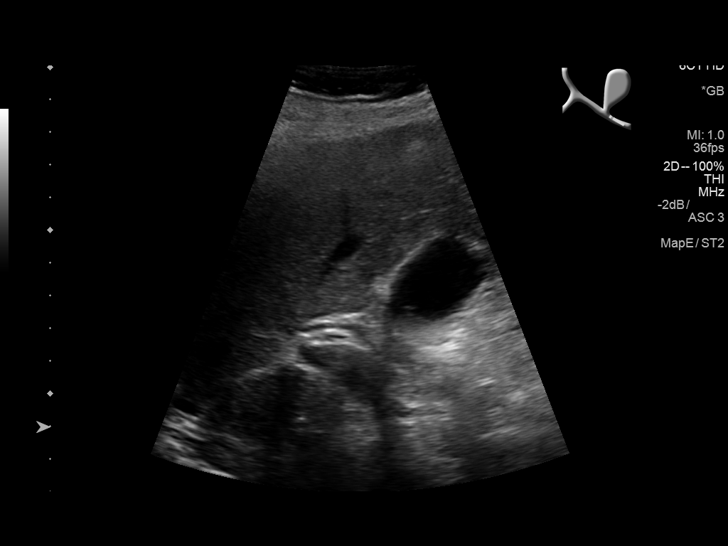
[im 17/50]
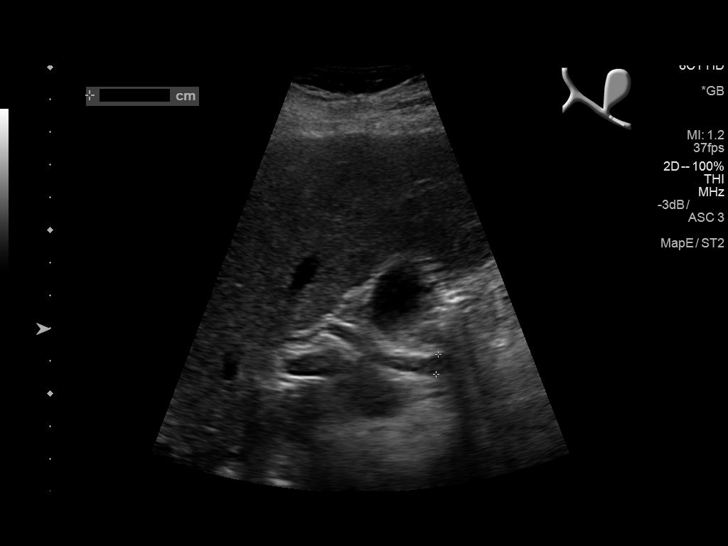
[im 19/50]
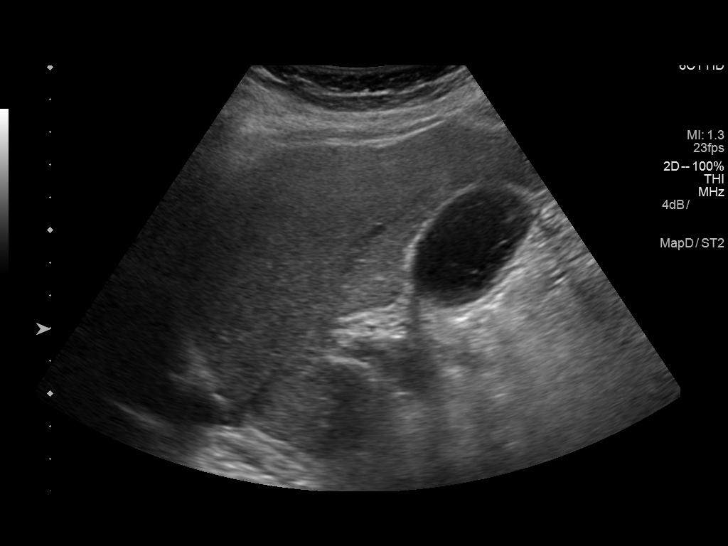
[im 23/50]
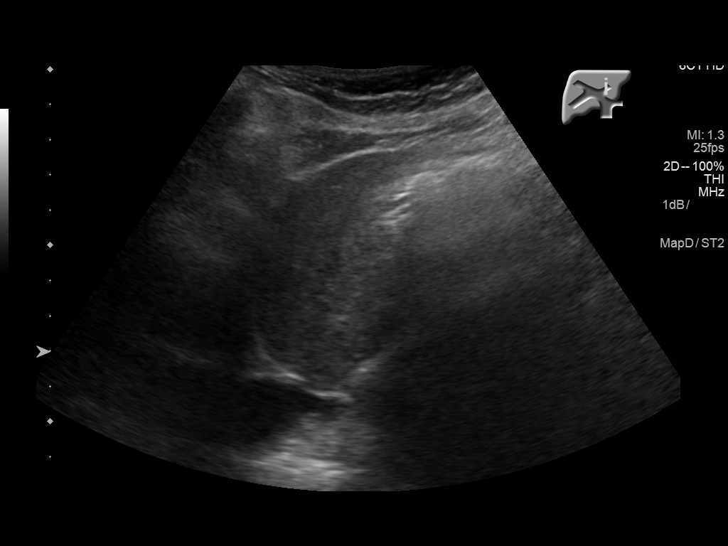
[im 27/50]
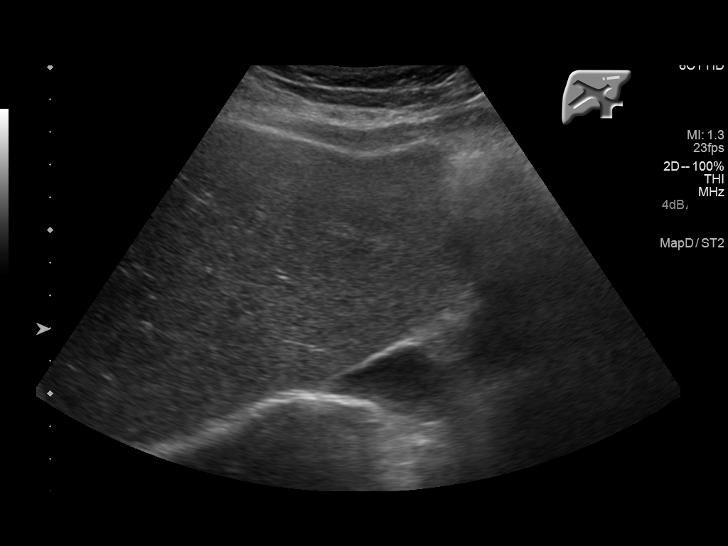
[im 31/50]
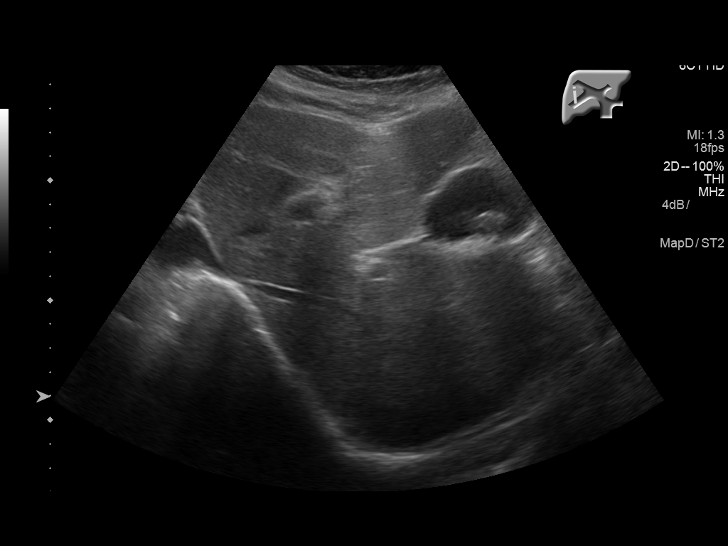
[im 33/50]
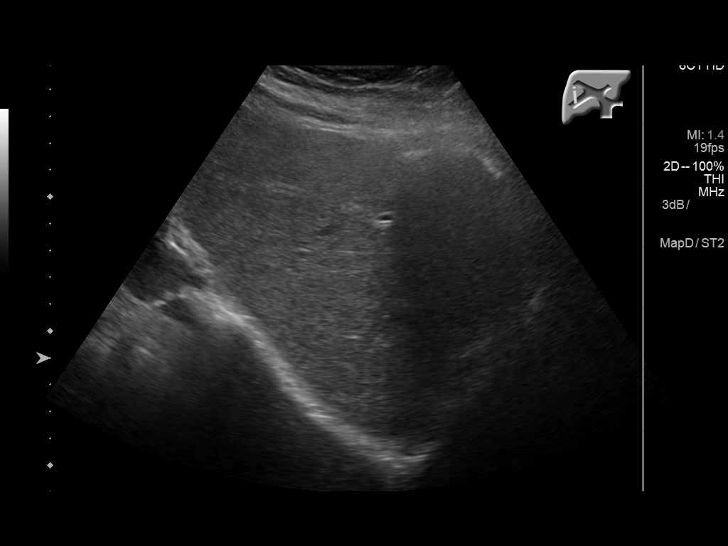
[im 37/50]
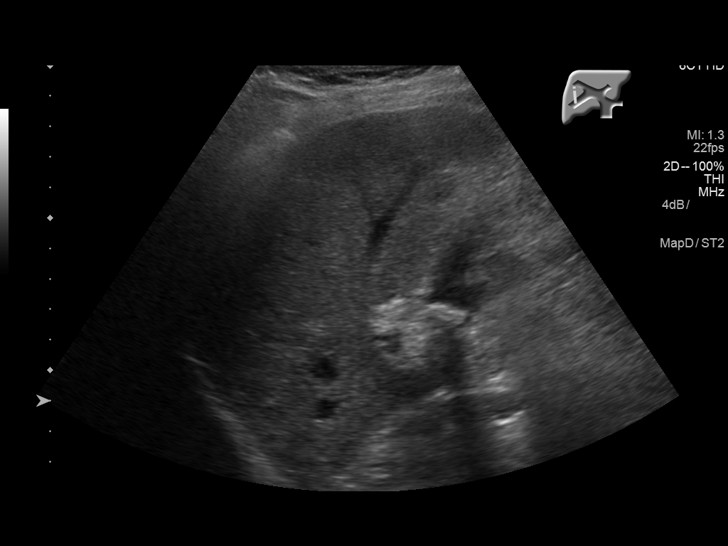
[im 41/50]
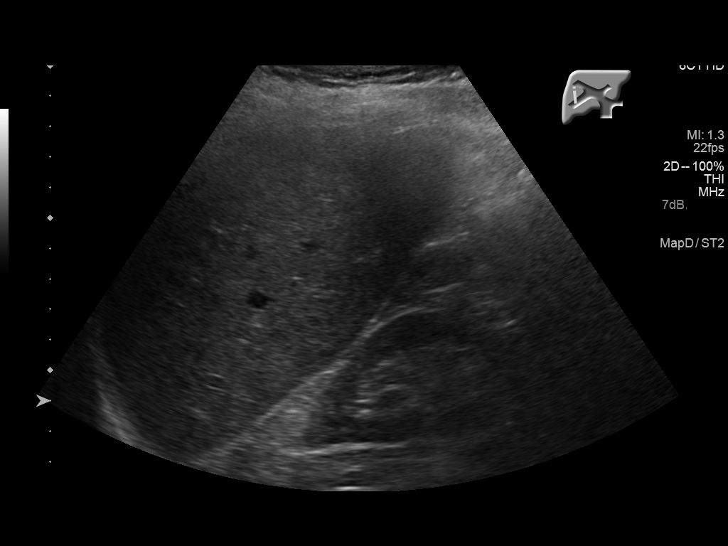
[im 45/50]
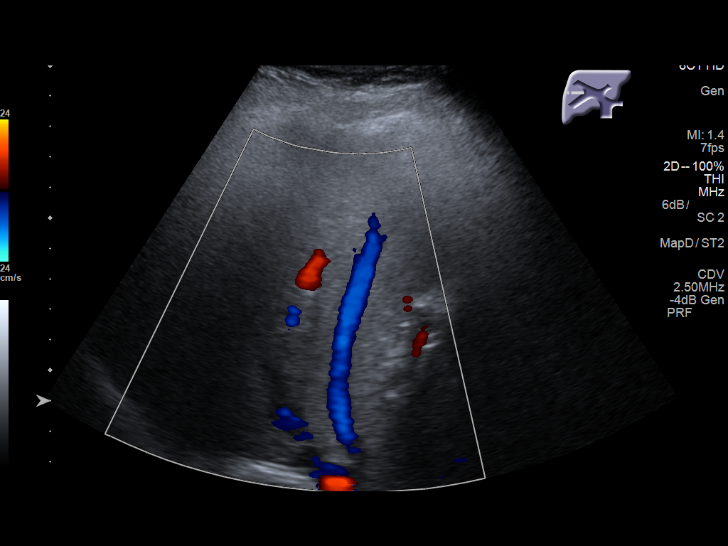
[im 50/50]
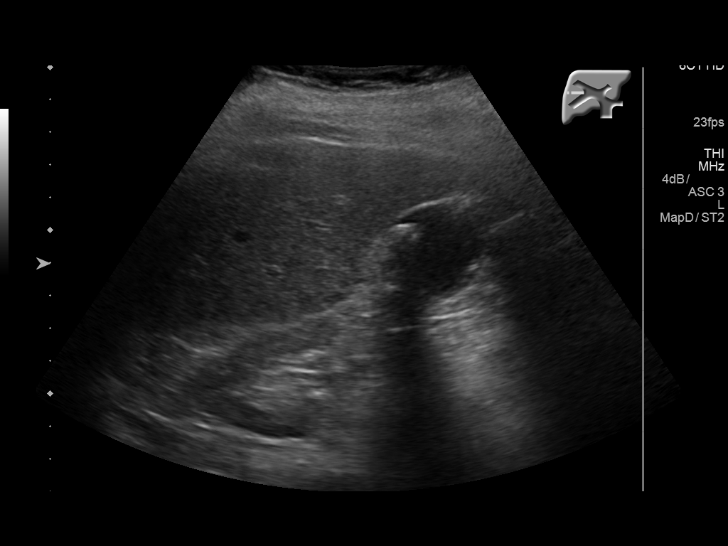

[14 of 25 positions shown; findings below may reference images not displayed]

FINDINGS: Gallbladder:

Several mobile gallstones measuring up to 1.5 cm. Gallbladder wall
does not appear thickened. No pericholecystic fluid detected. Per
ultrasound technologist patient was not tender over this region
during scanning.

Common bile duct:

Diameter: 6.1 mm

Liver:

No focal lesion identified. Within normal limits in parenchymal
echogenicity.
IMPRESSION: Several mobile gallstones measuring up to 1.5 cm. No sonographic
evidence of acute cholecystitis.

## 2017-11-05 ENCOUNTER — Other Ambulatory Visit: Payer: Self-pay | Admitting: Family

## 2017-11-05 DIAGNOSIS — E039 Hypothyroidism, unspecified: Secondary | ICD-10-CM

## 2017-11-05 DIAGNOSIS — I2510 Atherosclerotic heart disease of native coronary artery without angina pectoris: Secondary | ICD-10-CM

## 2017-12-05 ENCOUNTER — Other Ambulatory Visit: Payer: Self-pay | Admitting: *Deleted

## 2017-12-05 DIAGNOSIS — I6529 Occlusion and stenosis of unspecified carotid artery: Secondary | ICD-10-CM

## 2017-12-24 ENCOUNTER — Ambulatory Visit: Payer: Medicare Other | Admitting: Family

## 2017-12-24 ENCOUNTER — Encounter: Payer: Self-pay | Admitting: Family

## 2017-12-24 VITALS — BP 134/70 | HR 65 | Temp 98.1°F | Wt 159.0 lb

## 2017-12-24 DIAGNOSIS — K219 Gastro-esophageal reflux disease without esophagitis: Secondary | ICD-10-CM

## 2017-12-24 DIAGNOSIS — Z1231 Encounter for screening mammogram for malignant neoplasm of breast: Secondary | ICD-10-CM | POA: Diagnosis not present

## 2017-12-24 DIAGNOSIS — L989 Disorder of the skin and subcutaneous tissue, unspecified: Secondary | ICD-10-CM

## 2017-12-24 DIAGNOSIS — J309 Allergic rhinitis, unspecified: Secondary | ICD-10-CM

## 2017-12-24 DIAGNOSIS — E871 Hypo-osmolality and hyponatremia: Secondary | ICD-10-CM

## 2017-12-24 DIAGNOSIS — H6122 Impacted cerumen, left ear: Secondary | ICD-10-CM

## 2017-12-24 DIAGNOSIS — R319 Hematuria, unspecified: Secondary | ICD-10-CM | POA: Diagnosis not present

## 2017-12-24 DIAGNOSIS — Z1239 Encounter for other screening for malignant neoplasm of breast: Secondary | ICD-10-CM

## 2017-12-24 DIAGNOSIS — I10 Essential (primary) hypertension: Secondary | ICD-10-CM

## 2017-12-24 MED ORDER — MUPIROCIN 2 % EX OINT: 1.0000 "application " | TOPICAL_OINTMENT | Freq: Two times a day (BID) | CUTANEOUS | 0 refills | Status: DC

## 2017-12-24 MED ORDER — FLUTICASONE PROPIONATE 50 MCG/ACT NA SUSP: 2.0000 | Freq: Every day | NASAL | 3 refills | Status: DC

## 2017-12-24 NOTE — Assessment & Plan Note (Signed)
Nonspecific at this time.  As tender, will go ahead and treat for bacterial component with Bactroban.  If no improvement, particularly as a sun exposed area of the skin, I placed a referral to dermatology and patient understands the importance of seeing dermatology.  Will follow

## 2017-12-24 NOTE — Assessment & Plan Note (Signed)
Well-controlled.  Continue current regimen. 

## 2017-12-24 NOTE — Assessment & Plan Note (Signed)
Ordered.  Patient will schedule. 

## 2017-12-24 NOTE — Assessment & Plan Note (Signed)
resolved 

## 2017-12-24 NOTE — Assessment & Plan Note (Signed)
Controlled on Zantac.  Will follow.

## 2017-12-24 NOTE — Assessment & Plan Note (Addendum)
Pending urinalysis. No Improvement on Vesicare.  Patient politely declines referral to urology for urinary incontinence. Will follow

## 2017-12-24 NOTE — Progress Notes (Signed)
Subjective:    Patient ID: Kayla [name], female    DOB: 11-26-1926, 84 y.o.   MRN: [id number]  CC: Kayla [name] is a 84 y.o. female who presents today for follow up.   HPI: Left ear is 'stopped up.'  Cannot wear hearing aids.   Tender, red area on right side of nose x 2 weeks, unchanged. Not bleeding.Glasses dont bother it. No fever, chills, sinus congestion, orthopnea.. No longer on flonase. Takes zyrtec which helps cough.  No history of skin cancer.  Productive cough is better since seen at urgent care.  Given zpak.   Urinary frequency and urinary urgency. No urine leakage with cough, laugh. No change with vesicare.    HTN- compliant with medication. No cp, sob.   GERD- on zantac with help. No trouble swallowing 09/2017 - consult with GI.   Nonsmoker  09/2017 saw Dr Darrick Huntsman, started on solifenacin and bactrim; see urology if symptoms persist Echo 2014 45-50%.  CXR normal 2017 Na one year ago 133.  HISTORY:  Past Medical History:  Diagnosis Date  . Anginal pain (HCC)   . Arthritis   . Bundle branch block, left    Chronic  . CAD (coronary artery disease)   . Cholecystitis    from record  . Emphysema lung (HCC)   . GERD (gastroesophageal reflux disease)   . Heart murmur   . HOH (hard of hearing)   . Hyperlipidemia   . Hypertension   . Hypothyroidism   . Orthostatic headache    Mild  . Pain    HIP AND LEG PAIN USES  CANE  . Vertigo    Past Surgical History:  Procedure Laterality Date  . APPENDECTOMY  1957  . BREAST BIOPSY Left 2011   neg, stereo done by Dr. Evette Cristal  . BREAST BIOPSY    . CARDIAC CATHETERIZATION  2000   [facility]  . CATARACT EXTRACTION Right    right 2004, left 2018   . CATARACT EXTRACTION W/PHACO Left 04/12/2017   Procedure: CATARACT EXTRACTION PHACO AND INTRAOCULAR LENS PLACEMENT (IOC);  Surgeon: Lockie Mola, MD;  Location: [facility] ORS;  Service: Ophthalmology;  Laterality: Left;  [place] 01:09.0AP% 23.7CDE 16.35FLUID PACK LOT # [id number] H  .  CHOLECYSTECTOMY N/A 04/25/2016   Procedure: LAPAROSCOPIC CHOLECYSTECTOMY WITH INTRAOPERATIVE CHOLANGIOGRAM;  Surgeon: Kieth Brightly, MD;  Location: [facility] ORS;  Service: General;  Laterality: N/A;  . CORONARY ANGIOPLASTY     STENT  . CORONARY ARTERY BYPASS GRAFT  2001  . KNEE SURGERY    . thyroid  1970  . UPPER GI ENDOSCOPY  2014   Family History  Problem Relation Age of Onset  . Heart failure Mother 23  . Heart failure Sister 84  . Heart attack Sister   . Heart attack Brother 42    Allergies: Bee pollen; Pollen extract; and Motrin [ibuprofen] Current Outpatient Medications on File Prior to Visit  Medication Sig Dispense Refill  . amLODipine (NORVASC) 5 MG tablet Take 1 tablet (5 mg total) by mouth daily. 90 tablet 1  . aspirin 81 MG tablet Take 81 mg by mouth daily.      . carvedilol (COREG) 6.25 MG tablet One pill twice a day 240 tablet 1  . Cholecalciferol (VITAMIN D) 2000 UNITS tablet Take 2,000 Units by mouth daily.    . Cyanocobalamin (VITAMIN B 12 PO) Take 3,000 Units by mouth daily.    [name] levothyroxine (SYNTHROID, LEVOTHROID) 100 MCG tablet TAKE 1 TABLET BY MOUTH  DAILY BEFORE  BREAKFAST 90 tablet 1  . losartan (COZAAR) 100 MG tablet Take 1 tablet (100 mg total) by mouth daily. 90 tablet 1  . pravastatin (PRAVACHOL) 20 MG tablet TAKE 1 TABLET BY MOUTH AT  BEDTIME 90 tablet 1  . ranitidine (ZANTAC) 150 MG tablet TAKE 1 TABLET BY MOUTH TWO  TIMES DAILY 180 tablet 1   No current facility-administered medications on file prior to visit.     Social History   Tobacco Use  . Smoking status: Never Smoker  . Smokeless tobacco: Never Used  Substance Use Topics  . Alcohol use: No  . Drug use: No    Review of Systems  Constitutional: Negative for chills and fever.  HENT: Positive for hearing loss (chronic) and postnasal drip. Negative for ear discharge, ear pain, sinus pressure and trouble swallowing.   Respiratory: Negative for cough.   Cardiovascular: Negative for  chest pain and palpitations.  Gastrointestinal: Negative for nausea and vomiting.  Genitourinary: Positive for urgency. Negative for dyspareunia, dysuria and pelvic pain.  Skin: Positive for color change. Negative for wound.      Objective:    BP 134/70 (BP Location: Left Arm, Patient Position: Sitting, Cuff Size: Large)   Pulse 65   Temp 98.1 F (36.7 C) (Oral)   Wt 159 lb (72.1 kg)   SpO2 96%   BMI 30.04 kg/m  BP Readings from Last 3 Encounters:  12/24/17 134/70  10/10/17 (!) 170/79  09/21/17 (!) 160/80   Wt Readings from Last 3 Encounters:  12/24/17 159 lb (72.1 kg)  10/10/17 156 lb (70.8 kg)  09/21/17 158 lb (71.7 kg)    Physical Exam  Constitutional: She appears well-developed and well-nourished.  HENT:  Head: Normocephalic and atraumatic.  Right Ear: Hearing, tympanic membrane, external ear and ear canal normal. No drainage, swelling or tenderness. No foreign bodies. Tympanic membrane is not erythematous and not bulging. No middle ear effusion. No decreased hearing is noted.  Left Ear: Hearing, tympanic membrane, external ear and ear canal normal. No decreased hearing is noted.  Nose: Nose normal. No rhinorrhea. Right sinus exhibits no maxillary sinus tenderness and no frontal sinus tenderness. Left sinus exhibits no maxillary sinus tenderness and no frontal sinus tenderness.    Mouth/Throat: Uvula is midline, oropharynx is clear and moist and mucous membranes are normal. No oropharyngeal exudate, posterior oropharyngeal edema, posterior oropharyngeal erythema or tonsillar abscesses.  Cerumen impaction left ear  Well-circumscribed erythematous area on right side of nose as noted on diagram.  Approximately 2 3 cm in diameter.  No purulent discharge.  No fluctuance appreciated.  Slightly tender.  Eyes: Conjunctivae are normal.  Cardiovascular: Regular rhythm, normal heart sounds and normal pulses.  Pulmonary/Chest: Effort normal and breath sounds normal. She has no  wheezes. She has no rhonchi. She has no rales.  Lymphadenopathy:       Head (right side): No submental, no submandibular, no tonsillar, no preauricular, no posterior auricular and no occipital adenopathy present.       Head (left side): No submental, no submandibular, no tonsillar, no preauricular, no posterior auricular and no occipital adenopathy present.    She has no cervical adenopathy.  Neurological: She is alert.  Skin: Skin is warm and dry.  Psychiatric: She has a normal mood and affect. Her speech is normal and behavior is normal. Thought content normal.  Vitals reviewed. left cerumen impaction, resolved with irrigation by CMA. After which, I also examined patient and the EAC's and TM's are clear. Patient tolerated  procedure well.      Assessment & Plan:   Problem List Items Addressed This Visit      Cardiovascular and Mediastinum   Essential hypertension    Well-controlled.  Continue current regimen.        Respiratory   Allergic rhinitis    Working diagnosis of cough is allergic rhinitis.  Patient has not been on Flonase.  Advised trial of Flonase.  She will let me know if not better.      Relevant Medications   fluticasone (FLONASE) 50 MCG/ACT nasal spray     Digestive   GERD (gastroesophageal reflux disease)    Controlled on Zantac.  Will follow.        Nervous and Auditory   Impacted cerumen of left ear - Primary    resolved        Musculoskeletal and Integument   Skin lesion    Nonspecific at this time.  As tender, will go ahead and treat for bacterial component with Bactroban.  If no improvement, particularly as a sun exposed area of the skin, I placed a referral to dermatology and patient understands the importance of seeing dermatology.  Will follow      Relevant Medications   mupirocin ointment (BACTROBAN) 2 %   Other Relevant Orders   Ambulatory referral to Dermatology     Other   Screening for breast cancer    Ordered.  Patient will  schedule.      Relevant Orders   MM 3D SCREEN BREAST BILATERAL   Hyponatremia    Chronic for at least the past year.  No drugs appear to be the offending agents.  No smoking history.  Advised chest x-ray, consult endocrine.  Discussed with patient my suspicion for SIADH.  She politely declines work-up at this time.  She would like to monitor her sodium.  Pending BMP today.      Hematuria    Pending urinalysis. No Improvement on Vesicare.  Patient politely declines referral to urology for urinary incontinence. Will follow      Relevant Orders   Urine Microscopic       I have discontinued Dilara W. Clagg's omeprazole, solifenacin, and sulfamethoxazole-trimethoprim. I am also having her start on mupirocin ointment. Additionally, I am having her maintain her aspirin, Cyanocobalamin (VITAMIN B 12 PO), Vitamin D, ranitidine, carvedilol, amLODipine, losartan, pravastatin, levothyroxine, and fluticasone.   Meds ordered this encounter  Medications  . fluticasone (FLONASE) 50 MCG/ACT nasal spray    Sig: Place 2 sprays into both nostrils daily.    Dispense:  16 g    Refill:  3    Order Specific Question:   Supervising Provider    Answer:   Darrick Huntsman, TERESA L [[id number]]  . mupirocin ointment (BACTROBAN) 2 %    Sig: Place 1 application into the nose 2 (two) times daily.    Dispense:  22 g    Refill:  0    Order Specific Question:   Supervising Provider    Answer:   Sherlene Shams [[id number]]    Return precautions given.   Risks, benefits, and alternatives of the medications and treatment plan prescribed today were discussed, and patient expressed understanding.   Education regarding symptom management and diagnosis given to patient on AVS.  Continue to follow with Allegra Grana, FNP for routine health maintenance.   Kayla [name] and I agreed with plan.   Rennie Plowman, FNP

## 2017-12-24 NOTE — Assessment & Plan Note (Signed)
Working diagnosis of cough is allergic rhinitis.  Patient has not been on Flonase.  Advised trial of Flonase.  She will let me know if not better.

## 2017-12-24 NOTE — Assessment & Plan Note (Addendum)
Chronic for at least the past year.  No drugs appear to be the offending agents.  No smoking history.  Advised chest x-ray, consult endocrine.  Discussed with patient my suspicion for SIADH.  She politely declines work-up at this time.  She would like to monitor her sodium.  Pending BMP today.

## 2017-12-24 NOTE — Patient Instructions (Addendum)
Labs today  We will continue to monitor our sodium as discussed  Let me know if at any point you would like to see endocrine for an evaluation of low sodium.   Trial antibiotic topical for nose- if no improvement, since sun exposed area, please take dermatologist appointment.   Do not use Q-tips in the ear. Use Hydrogen Peroxide or Debrox drops weekly as needed for cerumen build up. Never stick anything into the ear canal. Return to clinic if thin puss drains out or if foul odor comes from ear.  We placed a referral for mammogram this year. I asked that you call one the below locations and schedule this when it is convenient for you.   As discussed, I would like you to ask for 3D mammogram over the traditional 2D mammogram as new evidence suggest 3D is superior.   Please note that NOT all insurance companies cover 3D and you may have to pay a higher copay. You may call your insurance company to further clarify your benefits.   Options for Mammogram.    [facility]  [address]  [place], [place]  [phone]  * [facility]   [facility] [address] [place], [place] [phone] * Note if you ask for 3D mammogram at this location, you must request [place], [place] location*

## 2017-12-25 LAB — URINALYSIS, MICROSCOPIC ONLY: RBC / HPF: NONE SEEN (ref 0–?)

## 2017-12-25 LAB — BASIC METABOLIC PANEL
BUN: 20 mg/dL (ref 6–23)
CHLORIDE: 100 meq/L (ref 96–112)
CO2: 27 mEq/L (ref 19–32)
Calcium: 9.2 mg/dL (ref 8.4–10.5)
Creatinine, Ser: 1.12 mg/dL (ref 0.40–1.20)
GFR: 48.72 mL/min — ABNORMAL LOW (ref >60.00)
Glucose, Bld: 113 mg/dL — ABNORMAL HIGH (ref 70–99)
POTASSIUM: 3.9 meq/L (ref 3.5–5.1)
Sodium: 136 mEq/L (ref 135–145)

## 2018-01-09 ENCOUNTER — Ambulatory Visit
Admission: RE | Admit: 2018-01-09 | Discharge: 2018-01-09 | Disposition: A | Discharge status: Home / Self Care | Payer: Medicare Other | Source: Ambulatory Visit | Attending: Family | Admitting: Family

## 2018-01-09 DIAGNOSIS — Z1231 Encounter for screening mammogram for malignant neoplasm of breast: Secondary | ICD-10-CM | POA: Insufficient documentation

## 2018-01-09 DIAGNOSIS — Z1239 Encounter for other screening for malignant neoplasm of breast: Secondary | ICD-10-CM

## 2018-02-18 ENCOUNTER — Other Ambulatory Visit: Payer: Self-pay | Admitting: Family

## 2018-02-18 DIAGNOSIS — I10 Essential (primary) hypertension: Secondary | ICD-10-CM

## 2018-04-03 ENCOUNTER — Encounter: Payer: Self-pay | Admitting: Family

## 2018-04-03 ENCOUNTER — Ambulatory Visit: Payer: Medicare Other | Admitting: Family

## 2018-04-03 VITALS — BP 138/78 | HR 61 | Temp 98.0°F | Resp 15 | Ht 61.0 in | Wt 158.2 lb

## 2018-04-03 DIAGNOSIS — K219 Gastro-esophageal reflux disease without esophagitis: Secondary | ICD-10-CM | POA: Diagnosis not present

## 2018-04-03 DIAGNOSIS — I10 Essential (primary) hypertension: Secondary | ICD-10-CM

## 2018-04-03 DIAGNOSIS — G629 Polyneuropathy, unspecified: Secondary | ICD-10-CM | POA: Diagnosis not present

## 2018-04-03 DIAGNOSIS — M8580 Other specified disorders of bone density and structure, unspecified site: Secondary | ICD-10-CM

## 2018-04-03 DIAGNOSIS — R32 Unspecified urinary incontinence: Secondary | ICD-10-CM | POA: Insufficient documentation

## 2018-04-03 LAB — COMPREHENSIVE METABOLIC PANEL
ALT: 10 U/L (ref 0–35)
AST: 14 U/L (ref 0–37)
Albumin: 4.1 g/dL (ref 3.5–5.2)
Alkaline Phosphatase: 47 U/L (ref 39–117)
BILIRUBIN TOTAL: 1.2 mg/dL (ref 0.2–1.2)
BUN: 23 mg/dL (ref 6–23)
CALCIUM: 9.5 mg/dL (ref 8.4–10.5)
CO2: 31 meq/L (ref 19–32)
Chloride: 101 mEq/L (ref 96–112)
Creatinine, Ser: 1.14 mg/dL (ref 0.40–1.20)
GFR: 47.71 mL/min — ABNORMAL LOW (ref >60.00)
GLUCOSE: 113 mg/dL — AB (ref 70–99)
Potassium: 4.4 mEq/L (ref 3.5–5.1)
Sodium: 138 mEq/L (ref 135–145)
Total Protein: 6.6 g/dL (ref 6.0–8.3)

## 2018-04-03 LAB — B12 AND FOLATE PANEL
FOLATE: 12.3 ng/mL (ref >5.9)
Vitamin B-12: >1500 pg/mL — ABNORMAL HIGH (ref 211–911)

## 2018-04-03 LAB — HEMOGLOBIN A1C: Hgb A1c MFr Bld: 6 % (ref 4.6–6.5)

## 2018-04-03 LAB — TSH: TSH: 1.13 u[IU]/mL (ref 0.35–4.50)

## 2018-04-03 NOTE — Patient Instructions (Addendum)
Capsaicin for nerve pain in feet; let me know if becomes bothersome  Today we discussed referrals, orders. BONE density   I have placed these orders in the system for you.  Please be sure to give us a call if you have not heard from our office regarding scheduling a test or regarding referral in a timely manner.  It is very important that you let me know as soon as possible.    You look great!!

## 2018-04-03 NOTE — Progress Notes (Signed)
Subjective:    Patient ID: Kayla [name], female    DOB: 10/15/1926, 84 y.o.   MRN: [id number]  CC: Kayla [name] is a 84 y.o. female who presents today for follow up.   HPI:  Accompanied by daughter  Numbness in bilateral feet, starts near ankle and goes to toes. Intermittent for a year. Not worsened. No falls, wounds. Using cane. Feeling steady. No pain in low back. No numbness in buttocks.  Not very bothersome.   Excerise 3 x week.   gerd- controlled zantac.   HTN- compliant with medication. Denies exertional chest pain or pressure, numbness or tingling radiating to left arm or jaw, palpitations, dizziness, frequent headaches, changes in vision, or shortness of breath.    Urinary incontinence- only experiences during night time. No problems during the day.   Took it only once, vesicare . Not very bothersome.   Osteoporosis- had been on boniva, 4 months during 2018; last bone density 2017.No fracture. NO h/o RA. No trouble swallowing. No plans major dental surgery.     Mammogram UTD   HISTORY:  Past Medical History:  Diagnosis Date  . Anginal pain (HCC)   . Arthritis   . Bundle branch block, left    Chronic  . CAD (coronary artery disease)   . Cholecystitis    from record  . Emphysema lung (HCC)   . GERD (gastroesophageal reflux disease)   . Heart murmur   . HOH (hard of hearing)   . Hyperlipidemia   . Hypertension   . Hypothyroidism   . Orthostatic headache    Mild  . Pain    HIP AND LEG PAIN USES  CANE  . Vertigo    Past Surgical History:  Procedure Laterality Date  . APPENDECTOMY  1957  . BREAST BIOPSY Left 2011   neg, stereo done by Dr. Evette CristalSankar  . BREAST BIOPSY    . CARDIAC CATHETERIZATION  2000   [facility]  . CATARACT EXTRACTION Right    right 2004, left 2018   . CATARACT EXTRACTION W/PHACO Left 04/12/2017   Procedure: CATARACT EXTRACTION PHACO AND INTRAOCULAR LENS PLACEMENT (IOC);  Surgeon: Lockie MolaBrasington, Chadwick, MD;  Location: [facility] ORS;  Service:  Ophthalmology;  Laterality: Left;  [place] 01:09.0AP% 23.7CDE 16.35FLUID PACK LOT # [id number] H  . CHOLECYSTECTOMY N/A 04/25/2016   Procedure: LAPAROSCOPIC CHOLECYSTECTOMY WITH INTRAOPERATIVE CHOLANGIOGRAM;  Surgeon: Kieth BrightlySeeplaputhur G Sankar, MD;  Location: [facility] ORS;  Service: General;  Laterality: N/A;  . CORONARY ANGIOPLASTY     STENT  . CORONARY ARTERY BYPASS GRAFT  2001  . KNEE SURGERY    . thyroid  1970  . UPPER GI ENDOSCOPY  2014   Family History  Problem Relation Age of Onset  . Heart failure Mother 7976  . Heart failure Sister 4848  . Heart attack Sister   . Heart attack Brother 42    Allergies: Bee pollen; Pollen extract; and Motrin [ibuprofen] Current Outpatient Medications on File Prior to Visit  Medication Sig Dispense Refill  . amLODipine (NORVASC) 5 MG tablet Take 1 tablet (5 mg total) by mouth daily. 90 tablet 1  . aspirin 81 MG tablet Take 81 mg by mouth daily.      . carvedilol (COREG) 6.25 MG tablet TAKE 1 TABLET BY MOUTH  TWICE A DAY 180 tablet 1  . Cholecalciferol (VITAMIN D) 2000 UNITS tablet Take 2,000 Units by mouth daily.    . Cyanocobalamin (VITAMIN B 12 PO) Take 3,000 Units by mouth daily.    .[name]  fluticasone (FLONASE) 50 MCG/ACT nasal spray Place 2 sprays into both nostrils daily. 16 g 3  . levothyroxine (SYNTHROID, LEVOTHROID) 100 MCG tablet TAKE 1 TABLET BY MOUTH  DAILY BEFORE BREAKFAST 90 tablet 1  . losartan (COZAAR) 100 MG tablet TAKE 1 TABLET BY MOUTH  DAILY 90 tablet 1  . pravastatin (PRAVACHOL) 20 MG tablet TAKE 1 TABLET BY MOUTH AT  BEDTIME 90 tablet 1  . ranitidine (ZANTAC) 150 MG tablet TAKE 1 TABLET BY MOUTH TWO  TIMES DAILY 180 tablet 1   No current facility-administered medications on file prior to visit.     Social History   Tobacco Use  . Smoking status: Never Smoker  . Smokeless tobacco: Never Used  Substance Use Topics  . Alcohol use: No  . Drug use: No    Review of Systems  Constitutional: Negative for chills and fever.  Respiratory: Negative  for cough.   Cardiovascular: Negative for chest pain and palpitations.  Gastrointestinal: Negative for nausea and vomiting.  Genitourinary: Negative for difficulty urinating and urgency.  Musculoskeletal: Negative for back pain.  Neurological: Positive for numbness.      Objective:    BP 138/78 (BP Location: Left Arm, Patient Position: Sitting, Cuff Size: Normal)   Pulse 61   Temp 98 F (36.7 C) (Oral)   Resp 15   Ht 5\' 1"  (1.549 m)   Wt 158 lb 4 oz (71.8 kg)   SpO2 97%   BMI 29.90 kg/m  BP Readings from Last 3 Encounters:  04/03/18 138/78  12/24/17 134/70  10/10/17 (!) 170/79   Wt Readings from Last 3 Encounters:  04/03/18 158 lb 4 oz (71.8 kg)  12/24/17 159 lb (72.1 kg)  10/10/17 156 lb (70.8 kg)    Physical Exam  Constitutional: She appears well-developed and well-nourished.  Eyes: Conjunctivae are normal.  Cardiovascular: Normal rate, regular rhythm, normal heart sounds and normal pulses.  Pulmonary/Chest: Effort normal and breath sounds normal. She has no wheezes. She has no rhonchi. She has no rales.  Neurological: She is alert.  Sensation intact with microfilament exam bilateral lower extremities.  Skin: Skin is warm and dry.  Psychiatric: She has a normal mood and affect. Her speech is normal and behavior is normal. Thought content normal.  Vitals reviewed.      Assessment & Plan:   Problem List Items Addressed This Visit      Cardiovascular and Mediastinum   Essential hypertension    Controlled on current regimen.  No changes made.      Relevant Orders   Comprehensive metabolic panel     Digestive   GERD (gastroesophageal reflux disease)    Well-controlled on Zantac.  Will follow        Nervous and Auditory   Neuropathy    Pending lab evaluation.  Advised capsaicin.  Patient let me know if it becomes more bothersome      Relevant Orders   B12 and Folate Panel   TSH   Hemoglobin A1c     Musculoskeletal and Integument   Osteopenia -  Primary    Reviewed prior bone density 2017 with patient and daughter today.  Based on 10-year risk of hip fracture 4.4 patient just meets criteria to start treatment therapy.  Discussed today that she is due to repeat bone density scan.  if the same, patient declines any medication therapy.  If worsened, patient agrees to start  therapy.  We will trial Boniva.  Discussed at length risk of jaw necrosis,  atypical fracture with patient at great length.  She is aware of these.      Relevant Orders   DG Bone Density   VITAMIN D 25 Hydroxy (Vit-D Deficiency, Fractures)     Other   Urinary incontinence    She is not taking Vesicare. This is not bothersome for her and she declines urology referral at this time.          I have discontinued Ethelean W. Wintermute's mupirocin ointment. I am also having her maintain her aspirin, Cyanocobalamin (VITAMIN B 12 PO), Vitamin D, ranitidine, amLODipine, pravastatin, levothyroxine, fluticasone, carvedilol, and losartan.   No orders of the defined types were placed in this encounter.   Return precautions given.   Risks, benefits, and alternatives of the medications and treatment plan prescribed today were discussed, and patient expressed understanding.   Education regarding symptom management and diagnosis given to patient on AVS.  Continue to follow with Allegra Grana, FNP for routine health maintenance.   Kayla Echevaria and I agreed with plan.   Rennie Plowman, FNP

## 2018-04-03 NOTE — Assessment & Plan Note (Signed)
Well-controlled on Zantac.  Will follow

## 2018-04-03 NOTE — Assessment & Plan Note (Signed)
Controlled on current regimen.  No changes made 

## 2018-04-03 NOTE — Assessment & Plan Note (Signed)
She is not taking Vesicare. This is not bothersome for her and she declines urology referral at this time.

## 2018-04-03 NOTE — Assessment & Plan Note (Signed)
Reviewed prior bone density 2017 with patient and daughter today.  Based on 10-year risk of hip fracture 4.4 patient just meets criteria to start treatment therapy.  Discussed today that she is due to repeat bone density scan.  if the same, patient declines any medication therapy.  If worsened, patient agrees to start  therapy.  We will trial Boniva.  Discussed at length risk of jaw necrosis, atypical fracture with patient at great length.  She is aware of these.

## 2018-04-03 NOTE — Assessment & Plan Note (Signed)
Pending lab evaluation.  Advised capsaicin.  Patient let me know if it becomes more bothersome

## 2018-04-04 ENCOUNTER — Other Ambulatory Visit: Payer: Self-pay | Admitting: Family Medicine

## 2018-04-04 ENCOUNTER — Telehealth: Payer: Self-pay

## 2018-04-04 LAB — VITAMIN D 25 HYDROXY (VIT D DEFICIENCY, FRACTURES): VITD: 101.64 ng/mL — AB (ref 30.00–100.00)

## 2018-04-04 NOTE — Telephone Encounter (Signed)
FYI  Called and notified patient to stop Vitamin D 3, patient verbalized understanding.

## 2018-04-04 NOTE — Progress Notes (Signed)
Vit D elevated at 101. Will advise patient to stop vit D supplement.  CMET stable, Ca level good. Liver normal. Kidney functions are decreased, but remain stable from labs 3 months ago

## 2018-04-04 NOTE — Telephone Encounter (Signed)
Critical Lab - Vitamin D is 101.64 , Margarett is out the office.

## 2018-04-04 NOTE — Telephone Encounter (Signed)
Stop Vit D3 2000 units daily, I removed from her med list.  CMET stable

## 2018-04-09 ENCOUNTER — Other Ambulatory Visit: Payer: Self-pay | Admitting: Family

## 2018-04-09 DIAGNOSIS — T452X4D Poisoning by vitamins, undetermined, subsequent encounter: Secondary | ICD-10-CM

## 2018-05-04 IMAGING — CT CT ABD-PELV W/ CM
2 of 5 series · 16 of 46 positions shown, 18 images · IV contrast (APPLIED)
Comparison: Ultrasound March 17, 2016

CLINICAL DATA: Right upper quadrant pain and vomiting starting this
morning

EXAM:
CT ABDOMEN AND PELVIS WITH CONTRAST
TECHNIQUE: Multidetector CT imaging of the abdomen and pelvis was performed
using the standard protocol following bolus administration of
intravenous contrast.
CONTRAST:  75mL [id number] IOPAMIDOL ([id number]) INJECTION 61%

[Series 2: axial st · axial · 0.82mm/px · z∈[-795,-390]mm · 13 of 93 slices shown, 15 images]
[im 6/93  soft-tissue]
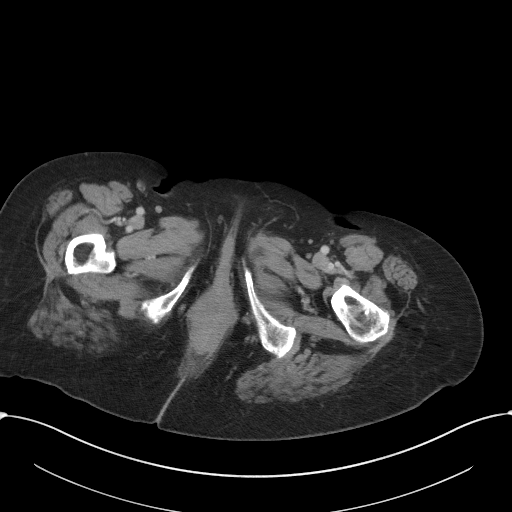
[im 6/93  bone]
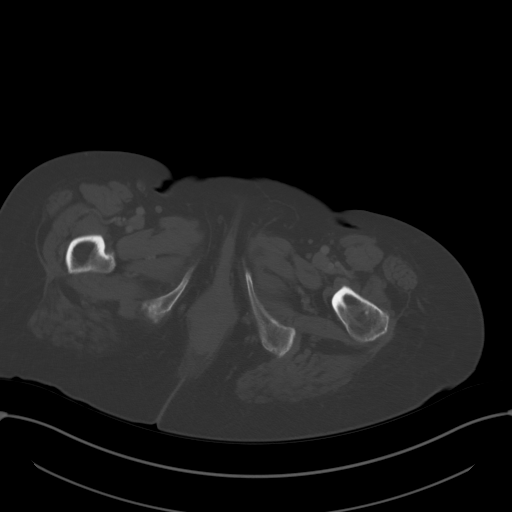
[im 11/93  soft-tissue]
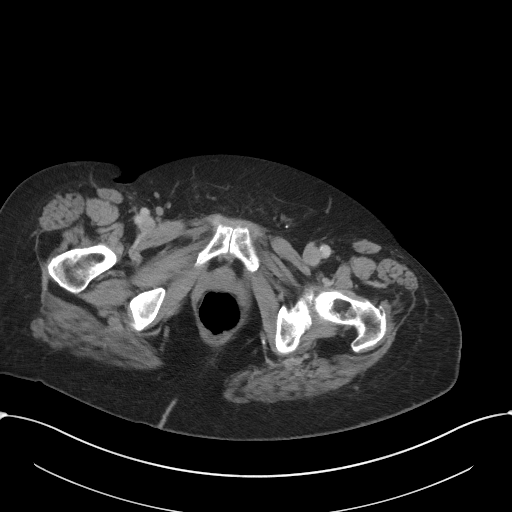
[im 21/93  soft-tissue]
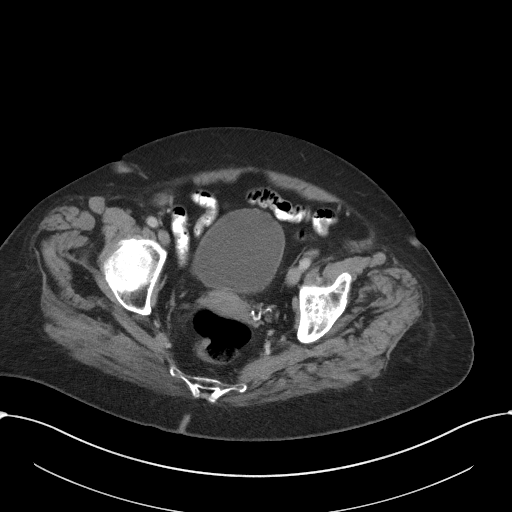
[im 26/93  soft-tissue]
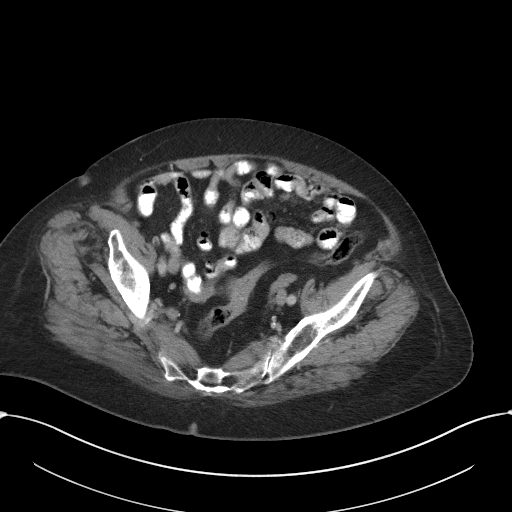
[im 31/93  soft-tissue]
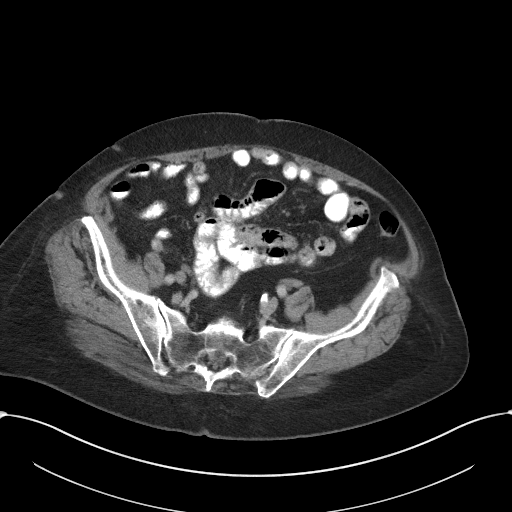
[im 41/93  soft-tissue]
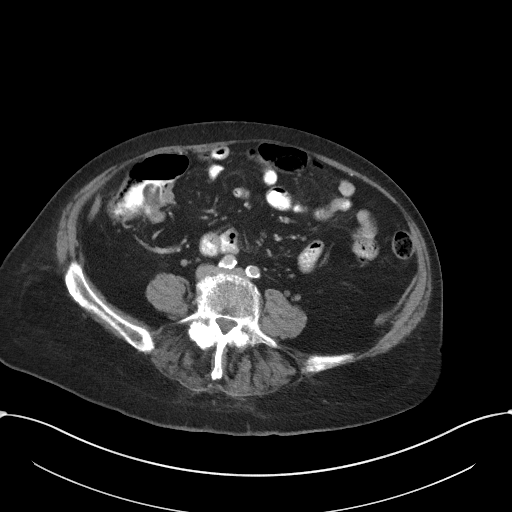
[im 47/93  soft-tissue]
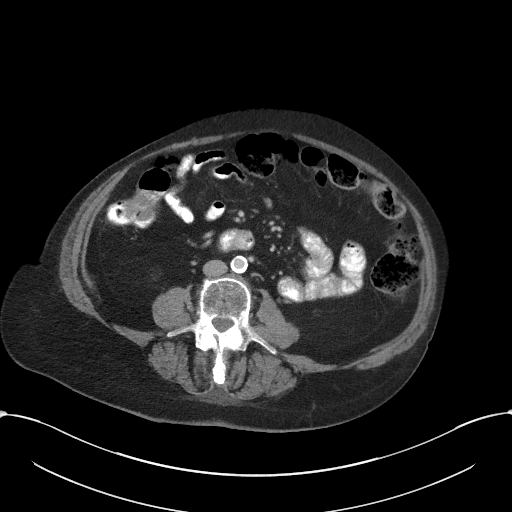
[im 52/93  soft-tissue]
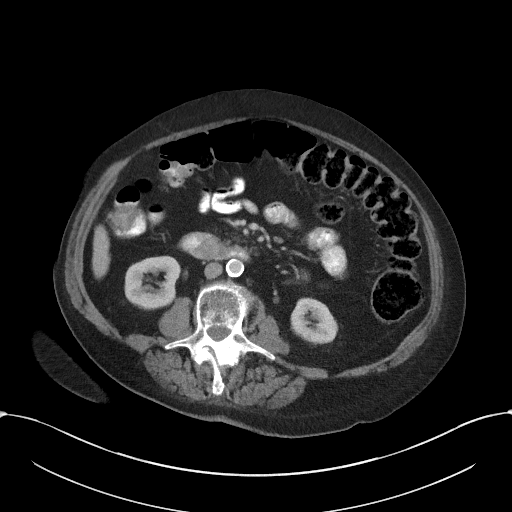
[im 62/93  soft-tissue]
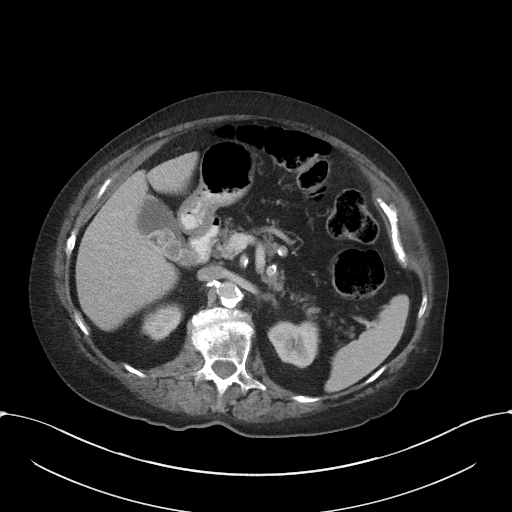
[im 62/93  bone]
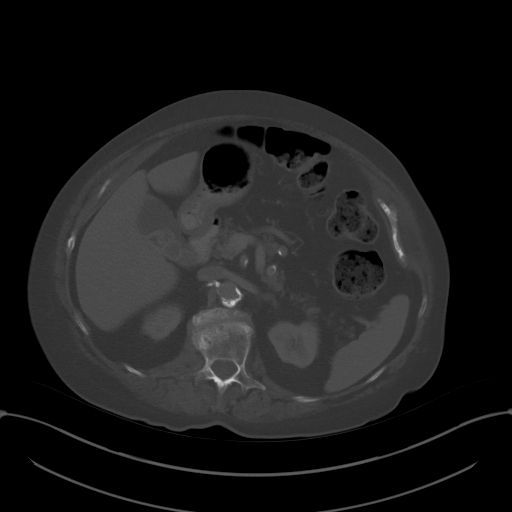
[im 67/93  soft-tissue]
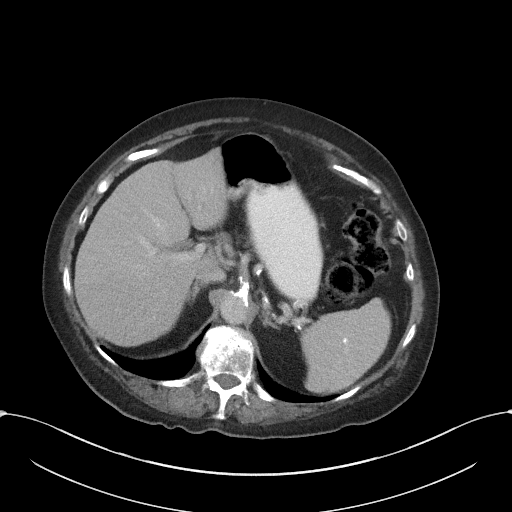
[im 72/93  soft-tissue]
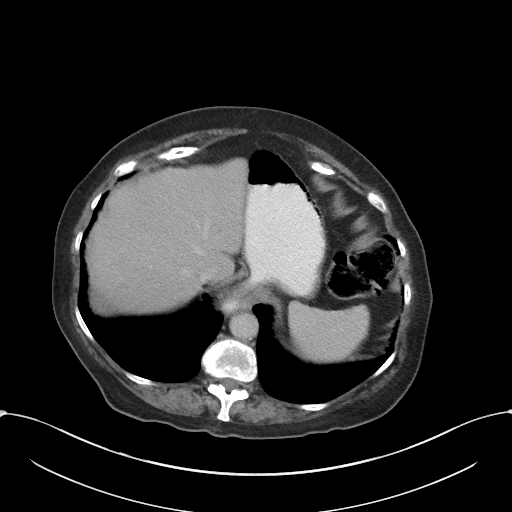
[im 82/93  soft-tissue]
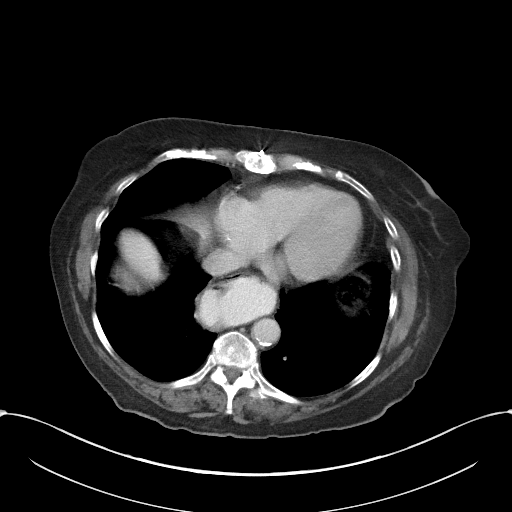
[im 87/93  soft-tissue]
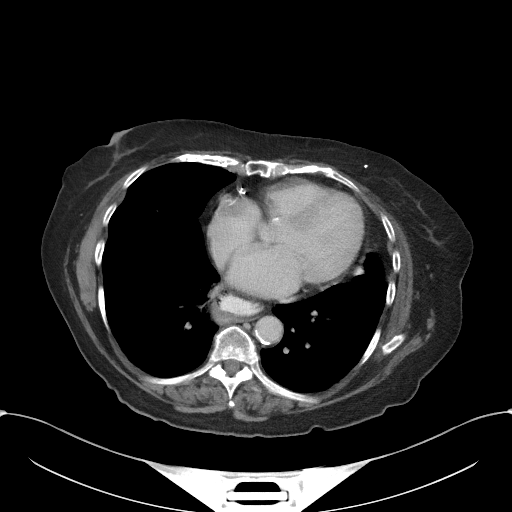

[Series 5: coronal st · coronal · 0.68mm/px · 3 of 90 slices shown]
[im 30/90  soft-tissue]
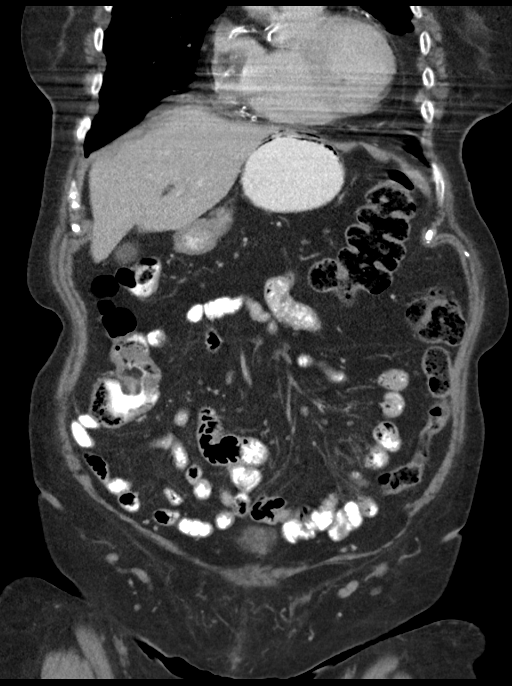
[im 40/90  soft-tissue]
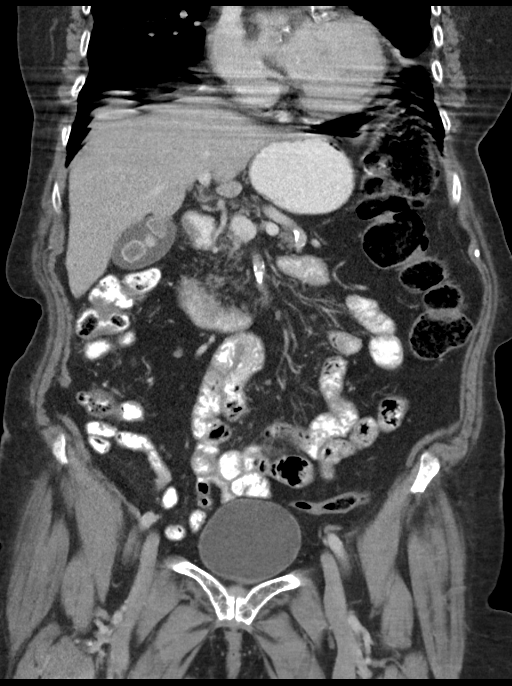
[im 50/90  soft-tissue]
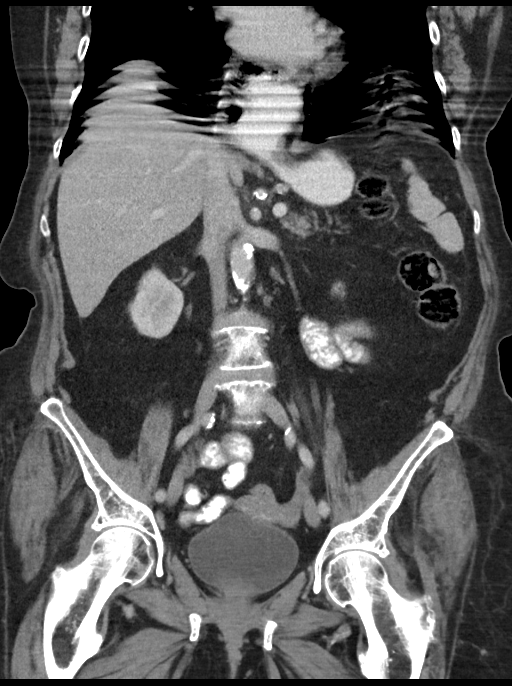

[16 of 46 positions shown; findings below may reference images not displayed]

FINDINGS: Lower chest: No acute abnormality.

Hepatobiliary: There is calcified granuloma within the liver. No
focal liver lesion is identified. Gallstones are identified within
the gallbladder. There is no inflammatory change around the
gallbladder. The common bile duct measures 5.5 mm.

Pancreas: Unremarkable. No pancreatic ductal dilatation or
surrounding inflammatory changes.

Spleen: There is calcified granuloma within the spleen. Spleen is
otherwise unremarkable.

Adrenals/Urinary Tract: The adrenal glands are normal. There is a 1
cm cyst in the lateral midpole right kidney. There is no
hydronephrosis bilaterally. The bladder is normal.

Stomach/Bowel: There is a moderate hiatal hernia. No evidence of
bowel wall thickening, distention, or inflammatory changes. The
appendix is not seen. No inflammation is noted around cecum.

Vascular/Lymphatic: Aortic atherosclerosis. No enlarged abdominal or
pelvic lymph nodes.

Reproductive: Uterus and bilateral adnexa are unremarkable.

Other: There is minimal umbilical herniation of mesenteric fat. No
abdominopelvic ascites.

Musculoskeletal: No acute or significant osseous findings.
Degenerative joint changes of the spine are noted.
IMPRESSION: No acute abnormality identified in the abdomen and pelvis.

Cholelithiasis.

Moderate hiatal hernia.

## 2018-05-15 ENCOUNTER — Other Ambulatory Visit: Payer: Self-pay | Admitting: Family

## 2018-05-15 DIAGNOSIS — I10 Essential (primary) hypertension: Secondary | ICD-10-CM

## 2018-05-15 DIAGNOSIS — I2510 Atherosclerotic heart disease of native coronary artery without angina pectoris: Secondary | ICD-10-CM

## 2018-05-15 DIAGNOSIS — K219 Gastro-esophageal reflux disease without esophagitis: Secondary | ICD-10-CM

## 2018-05-15 DIAGNOSIS — E039 Hypothyroidism, unspecified: Secondary | ICD-10-CM

## 2018-05-21 ENCOUNTER — Encounter: Payer: Self-pay | Admitting: Family

## 2018-05-27 ENCOUNTER — Other Ambulatory Visit: Payer: Self-pay | Admitting: Family

## 2018-05-27 DIAGNOSIS — Z1231 Encounter for screening mammogram for malignant neoplasm of breast: Secondary | ICD-10-CM

## 2018-06-11 ENCOUNTER — Encounter: Payer: Self-pay | Admitting: Cardiovascular Disease

## 2018-06-11 ENCOUNTER — Ambulatory Visit: Payer: Medicare Other | Admitting: Cardiovascular Disease

## 2018-06-11 VITALS — BP 162/80 | HR 60 | Ht 61.0 in | Wt 156.0 lb

## 2018-06-11 DIAGNOSIS — I25718 Atherosclerosis of autologous vein coronary artery bypass graft(s) with other forms of angina pectoris: Secondary | ICD-10-CM | POA: Diagnosis not present

## 2018-06-11 DIAGNOSIS — I10 Essential (primary) hypertension: Secondary | ICD-10-CM | POA: Diagnosis not present

## 2018-06-11 DIAGNOSIS — I447 Left bundle-branch block, unspecified: Secondary | ICD-10-CM

## 2018-06-11 DIAGNOSIS — E782 Mixed hyperlipidemia: Secondary | ICD-10-CM

## 2018-06-11 DIAGNOSIS — I6523 Occlusion and stenosis of bilateral carotid arteries: Secondary | ICD-10-CM | POA: Diagnosis not present

## 2018-06-11 NOTE — Patient Instructions (Signed)
Please monitor blood pressure Call with numbers in mid December   Medication Instructions:  No changes  If you need a refill on your cardiac medications before your next appointment, please call your pharmacy.    Lab work: No new labs needed   If you have labs (blood work) drawn today and your tests are completely normal, you will receive your results only by: [name]. MyChart Message (if you have MyChart) OR . A paper copy in the mail If you have any lab test that is abnormal or we need to change your treatment, we will call you to review the results.   Testing/Procedures: No new testing needed   Follow-Up: At [facility], you and your health needs are our priority.  As part of our continuing mission to provide you with exceptional heart care, we have created designated Provider Care Teams.  These Care Teams include your primary Cardiologist (physician) and Advanced Practice Providers (APPs -  Physician Assistants and Nurse Practitioners) who all work together to provide you with the care you need, when you need it.  . You will need a follow up appointment in 12 months .   Please call our office 2 months in advance to schedule this appointment.    . Providers on your designated Care Team:   . Nicolasa Duckinghristopher Berge, NP . Eula Listenyan Dunn, PA-C . Marisue IvanJacquelyn Visser, PA-C  Any Other Special Instructions Will Be Listed Below (If Applicable).  For educational health videos Log in to : [email or web address] Or : [email or web address], password : [email or web address]

## 2018-06-11 NOTE — Progress Notes (Signed)
Cardiology Office Note  Date:  06/11/2018   ID:  Kayla [name], DOB 02-20-1927, MRN [id number]  PCP:  Allegra Grana, FNP   Chief Complaint  Patient presents with  . other    12 mo follow up. Bilateral lower leg edema.Medications reviewed verbally.     HPI:  Kayla [name] is an 84 y/o woman with h/o  CAD s/p CABG in 2001 at [facility],  LBBB,  hyperlipidemia,  GERD   HTN.  Myoview in 2009 was normal.  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 - Tricuspid valve: Mild-moderate regurgitation.  Last carotid u/s in 2015 Carotid ultrasound done at outside facility showing 60% right carotid stenosis, minimal on the left  Lab work reviewed  total cholesterol 124 HBA1C 6.0 CR 1.14  EKG personally reviewed by myself on todays visit Shows normal sinus rhythm rate 60 bpm left bundle branch block   Other past medical history Previous admission to the hospital for chest discomfort. Rule out and was sent home Previous GERD symptoms, takes proton pump inhibitor  PMH:   has a past medical history of Anginal pain (HCC), Arthritis, Bundle branch block, left, CAD (coronary artery disease), Cholecystitis, Emphysema lung (HCC), GERD (gastroesophageal reflux disease), Heart murmur, HOH (hard of hearing), Hyperlipidemia, Hypertension, Hypothyroidism, Orthostatic headache, Pain, and Vertigo.  PSH:    Past Surgical  History:  Procedure Laterality Date  . APPENDECTOMY  1957  . BREAST BIOPSY Left 2011   neg, stereo done by Dr. Evette Cristal  . BREAST BIOPSY    . CARDIAC CATHETERIZATION  2000   [facility]  . CATARACT EXTRACTION Right    right 2004, left 2018   . CATARACT EXTRACTION W/PHACO Left 04/12/2017   Procedure: CATARACT EXTRACTION PHACO AND INTRAOCULAR LENS PLACEMENT (IOC);  Surgeon: Lockie Mola, MD;  Location: [facility] ORS;  Service: Ophthalmology;  Laterality: Left;  [place] 01:09.0AP% 23.7CDE 16.35FLUID PACK LOT # [id number] H  . CHOLECYSTECTOMY N/A 04/25/2016   Procedure: LAPAROSCOPIC CHOLECYSTECTOMY WITH INTRAOPERATIVE CHOLANGIOGRAM;  Surgeon: Kieth Brightly, MD;  Location: [facility] ORS;  Service: General;  Laterality: N/A;  . CORONARY ANGIOPLASTY     STENT  . CORONARY ARTERY BYPASS GRAFT  2001  . KNEE SURGERY    . thyroid  1970  . UPPER GI ENDOSCOPY  2014    Current Outpatient Medications  Medication Sig Dispense Refill  . amLODipine (NORVASC) 5 MG tablet TAKE 1 TABLET BY MOUTH  DAILY 90 tablet 1  . aspirin 81 MG tablet Take 81 mg by mouth daily.      . carvedilol (COREG) 6.25 MG tablet TAKE 1 TABLET BY MOUTH  TWICE A DAY 180 tablet 1  . Cyanocobalamin (VITAMIN B 12 PO) Take 3,000 Units by mouth daily.    . fluticasone (FLONASE) 50 MCG/ACT nasal spray Place 2 sprays into both nostrils daily. 16 g 3  . levothyroxine (SYNTHROID, LEVOTHROID) 100 MCG tablet TAKE 1 TABLET BY MOUTH  DAILY BEFORE BREAKFAST 90  tablet 1  . losartan (COZAAR) 100 MG tablet TAKE 1 TABLET BY MOUTH  DAILY 90 tablet 1  . omeprazole (PRILOSEC) 40 MG capsule TAKE 1 CAPSULE BY MOUTH  EVERY EVENING 90 capsule 0  . pravastatin (PRAVACHOL) 20 MG tablet TAKE 1 TABLET BY MOUTH AT  BEDTIME 90 tablet 1   No current facility-administered medications for this visit.      Allergies:   Bee pollen; Pollen extract; and Motrin [ibuprofen]   Social History:  The patient  reports that she has never smoked. She has never used smokeless tobacco.  She reports that she does not drink alcohol or use drugs.   Family History:   family history includes Heart attack in her sister; Heart attack (age of onset: 25) in her brother; Heart failure (age of onset: 67) in her sister; Heart failure (age of onset: 65) in her mother.    Review of Systems: Review of Systems  Constitutional: Negative.   Respiratory: Positive for shortness of breath.   Cardiovascular: Negative.   Gastrointestinal: Negative.   Musculoskeletal: Negative.   Neurological: Negative.   Psychiatric/Behavioral: Negative.   All other systems reviewed and are negative.   PHYSICAL EXAM: VS:  BP (!) 162/80 (BP Location: Left Arm, Patient Position: Sitting, Cuff Size: Normal)   Pulse 60  , BMI There is no height or weight on file to calculate BMI.  Constitutional:  oriented to person, place, and time. No distress.  HENT:  Head: Normocephalic and atraumatic.  Eyes:  no discharge. No scleral icterus.  Neck: Normal range of motion. Neck supple. No JVD present.  Cardiovascular: Normal rate, regular rhythm, normal heart sounds and intact distal pulses. Exam reveals no gallop and no friction rub. No edema 2/6 systolic ejection murmur appreciated right sternal border and left sternal border Pulmonary/Chest: Effort normal and breath sounds normal. No stridor. No respiratory distress.  no wheezes.  no rales.  no tenderness.  Abdominal: Soft.  no distension.  no tenderness.  Musculoskeletal: Normal range of motion.  no  tenderness or deformity.  Neurological:  normal muscle tone. Coordination normal. No atrophy Skin: Skin is warm and dry. No rash noted. not diaphoretic.  Psychiatric:  normal mood and affect. behavior is normal. Thought content normal.    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 (HCC) - Plan: EKG 12-Lead She reports having shortness of breath, we have offered stress testing She does not feel it is a problem and prefers to wait on any further evaluation Discussed anginal symptoms to watch for and suggested she call us for any change in her symptoms  Essential hypertension  Blood pressure elevated, not trending downward on repeat check by myself still 160 systolic Other office visits with 130 systolic Recommend she monitor blood pressure closely at home and call our office with numbers  Aortic valve stenosis, etiology of cardiac valve disease unspecified -  Mild aortic valve stenosis,  murmur on exam Tricuspid valve and aortic valve regurgitation also noted on previous echo  Carotid stenosis, right 40-59% stenosis on the right,  Less than 39% blockage on the left,  last evaluated 2018 Consider repeat carotid ultrasound next clinic visit  Hyperlipidemia Cholesterol at goal, No changes made   Total encounter time more than 25 minutes  Greater than 50% was spent in counseling and coordination of care with the patient  Disposition:   F/U  12 months   Orders Placed This Encounter  Procedures  . EKG 12-Lead     Signed, Dossie Arbourim Jillana Selph, M.D., Ph.D. 06/11/2018  [facility], [place] [phone]

## 2018-07-01 ENCOUNTER — Telehealth: Payer: Self-pay | Admitting: Cardiovascular Disease

## 2018-07-01 MED ORDER — CLONIDINE HCL 0.1 MG PO TABS: 0.1000 mg | ORAL_TABLET | Freq: Two times a day (BID) | ORAL | 2 refills | Status: DC

## 2018-07-01 NOTE — Telephone Encounter (Signed)
Blood pressure is elevated Would consider starting clonidine 0.1 mg twice daily Must be taken twice a day to avoid labile hypertension Continue other medications Call for any side effect

## 2018-07-01 NOTE — Telephone Encounter (Signed)
Patient verbalized understanding of recommendations including to take clonidine 0.1 mg by mouth two times a day. She is aware to take twice a day to avoid labile hypertension. She will let us know if they're are any problems. Rx sent to pharmacy.

## 2018-07-01 NOTE — Telephone Encounter (Signed)
Patient calling with BP numbers per Dr. Windell HummingbirdGollan's request. See 06/11/18 office note.  To MD to review.

## 2018-07-01 NOTE — Telephone Encounter (Signed)
[  t is calling to give BP readings  Pt c/o BP issue: STAT if pt c/o blurred vision, one-sided weakness or slurred speech  1. What are your last 5 BP readings?  12/9-152/72 HR 65 12/11-139/65 HR 62 12/13-144/69 HR 64 12/15-161/75 HR 54 Today -155/80 HR 63  2. Are you having any other symptoms (ex. Dizziness, headache, blurred vision, passed out)? No, pt states she is feeling better  3. What is your BP issue?

## 2018-07-08 NOTE — Telephone Encounter (Signed)
Pt c/o BP issue: STAT if pt c/o blurred vision, one-sided weakness or slurred speech  1. What are your last 5 BP readings? This morning 127/61 HR 51  2. Are you having any other symptoms (ex. Dizziness, headache, blurred vision, passed out)? Feels bad, drained - headache yesterday   3. What is your BP issue? Patient's BP has been running low since taking medication and does not feel well.

## 2018-07-08 NOTE — Telephone Encounter (Signed)
Pt reports that new medication, Clonidine was started 1 week ago and she is "feeling bad and slow".  BP has improved but side effects from medication are "making her feel terrible". BP this am was 127/61, HR 51  Routing call to Dr. Mariah MillingGollan for advice.   Advised pt to call for any further questions or concerns

## 2018-07-12 NOTE — Telephone Encounter (Signed)
Spoke with patient and she states that she has continued taking the clonidine 0.1 mg twice a day and has been feeling some better. She reports her BP yesterday 117/61 with heart rate 52. Today she states her blood pressure was 147/73 heart rate 54 and this was 30 minutes after medications. Reviewed that she should check blood pressures about 1-2 hours after medications and to let us know if she should have any continued high readings. She verbalized understanding with no further questions at this time.

## 2018-07-22 ENCOUNTER — Encounter: Payer: Self-pay | Admitting: Family

## 2018-07-29 ENCOUNTER — Encounter: Payer: Self-pay | Admitting: Family

## 2018-07-29 ENCOUNTER — Ambulatory Visit (INDEPENDENT_AMBULATORY_CARE_PROVIDER_SITE_OTHER): Payer: Medicare Other | Admitting: Family

## 2018-07-29 VITALS — BP 136/62 | HR 58 | Temp 97.8°F | Wt 157.0 lb

## 2018-07-29 DIAGNOSIS — E039 Hypothyroidism, unspecified: Secondary | ICD-10-CM | POA: Diagnosis not present

## 2018-07-29 DIAGNOSIS — T452X1D Poisoning by vitamins, accidental (unintentional), subsequent encounter: Secondary | ICD-10-CM | POA: Diagnosis not present

## 2018-07-29 DIAGNOSIS — E782 Mixed hyperlipidemia: Secondary | ICD-10-CM | POA: Diagnosis not present

## 2018-07-29 DIAGNOSIS — R5383 Other fatigue: Secondary | ICD-10-CM

## 2018-07-29 DIAGNOSIS — I10 Essential (primary) hypertension: Secondary | ICD-10-CM | POA: Diagnosis not present

## 2018-07-29 DIAGNOSIS — K219 Gastro-esophageal reflux disease without esophagitis: Secondary | ICD-10-CM

## 2018-07-29 DIAGNOSIS — R600 Localized edema: Secondary | ICD-10-CM

## 2018-07-29 LAB — LIPID PANEL
CHOLESTEROL: 114 mg/dL (ref 0–200)
HDL: 54.6 mg/dL (ref >39.00)
LDL CALC: 45 mg/dL (ref 0–99)
NonHDL: 58.9
Total CHOL/HDL Ratio: 2
Triglycerides: 68 mg/dL (ref 0.0–149.0)
VLDL: 13.6 mg/dL (ref 0.0–40.0)

## 2018-07-29 LAB — CBC WITH DIFFERENTIAL/PLATELET
BASOS PCT: 1.2 % (ref 0.0–3.0)
Basophils Absolute: 0.1 10*3/uL (ref 0.0–0.1)
EOS ABS: 0.2 10*3/uL (ref 0.0–0.7)
Eosinophils Relative: 3.1 % (ref 0.0–5.0)
HEMATOCRIT: 38.5 % (ref 36.0–46.0)
HEMOGLOBIN: 12.6 g/dL (ref 12.0–15.0)
LYMPHS ABS: 1.3 10*3/uL (ref 0.7–4.0)
Lymphocytes Relative: 23.3 % (ref 12.0–46.0)
MCHC: 32.8 g/dL (ref 30.0–36.0)
MCV: 92.7 fl (ref 78.0–100.0)
Monocytes Absolute: 0.4 10*3/uL (ref 0.1–1.0)
Monocytes Relative: 7.9 % (ref 3.0–12.0)
NEUTROS ABS: 3.7 10*3/uL (ref 1.4–7.7)
Neutrophils Relative %: 64.5 % (ref 43.0–77.0)
PLATELETS: 148 10*3/uL — AB (ref 150.0–400.0)
RBC: 4.16 Mil/uL (ref 3.87–5.11)
RDW: 15.3 % (ref 11.5–15.5)
WBC: 5.7 10*3/uL (ref 4.0–10.5)

## 2018-07-29 LAB — COMPREHENSIVE METABOLIC PANEL
ALT: 8 U/L (ref 0–35)
AST: 11 U/L (ref 0–37)
Albumin: 4 g/dL (ref 3.5–5.2)
Alkaline Phosphatase: 45 U/L (ref 39–117)
BUN: 15 mg/dL (ref 6–23)
CHLORIDE: 102 meq/L (ref 96–112)
CO2: 27 mEq/L (ref 19–32)
Calcium: 9.2 mg/dL (ref 8.4–10.5)
Creatinine, Ser: 1.12 mg/dL (ref 0.40–1.20)
GFR: 48.65 mL/min — ABNORMAL LOW (ref >60.00)
Glucose, Bld: 112 mg/dL — ABNORMAL HIGH (ref 70–99)
Potassium: 4.4 mEq/L (ref 3.5–5.1)
Sodium: 137 mEq/L (ref 135–145)
Total Bilirubin: 0.9 mg/dL (ref 0.2–1.2)
Total Protein: 6.4 g/dL (ref 6.0–8.3)

## 2018-07-29 LAB — HEMOGLOBIN A1C: HEMOGLOBIN A1C: 6 % (ref 4.6–6.5)

## 2018-07-29 LAB — TSH: TSH: 1.05 u[IU]/mL (ref 0.35–4.50)

## 2018-07-29 LAB — VITAMIN D 25 HYDROXY (VIT D DEFICIENCY, FRACTURES): VITD: 80.35 ng/mL (ref 30.00–100.00)

## 2018-07-29 NOTE — Progress Notes (Signed)
Subjective:    Patient ID: Kayla [name], female    DOB: 06/27/1927, 85 y.o.   MRN: [id number]  CC: Kayla [name] is a 85 y.o. female who presents today for follow up.   HPI: Feeling well, no complaints.   Hypertension-compliant with medication. blood pressure at home has been ranging between 119/61, 145/69, which is a couple of hours after medication.  Heart rate 51, 56, 69. No dizziness.   Hypothyroidism- Compliant with medication.   Goes to the gym twice per week, stationary bike for 20 minutes. No CP.  No longer on vitamin D.   Takes b12 orally  No longer having numbness in legs.   Falls asleep easily. Then wakes up at 3 am and trouble going back to sleep. Describes fatigue.  Declines medication for sleep.   Bone density scheduled. UTD mammogram.    Leg swelling improved. Better in winter.   GERD- Off prilosec. Nexium OTC with resolve. Was having breakthrough sxs, vomiting,  epigastric nuri g    HISTORY:  Past Medical History:  Diagnosis Date  . Anginal pain (HCC)   . Arthritis   . Bundle branch block, left    Chronic  . CAD (coronary artery disease)   . Cholecystitis    from record  . Emphysema lung (HCC)   . GERD (gastroesophageal reflux disease)   . Heart murmur   . HOH (hard of hearing)   . Hyperlipidemia   . Hypertension   . Hypothyroidism   . Orthostatic headache    Mild  . Pain    HIP AND LEG PAIN USES  CANE  . Vertigo    Past Surgical History:  Procedure Laterality Date  . APPENDECTOMY  1957  . BREAST BIOPSY Left 2011   neg, stereo done by Dr. Evette CristalSankar  . BREAST BIOPSY    . CARDIAC CATHETERIZATION  2000   [facility]  . CATARACT EXTRACTION Right    right 2004, left 2018   . CATARACT EXTRACTION W/PHACO Left 04/12/2017   Procedure: CATARACT EXTRACTION PHACO AND INTRAOCULAR LENS PLACEMENT (IOC);  Surgeon: Lockie MolaBrasington, Chadwick, MD;  Location: [facility] ORS;  Service: Ophthalmology;  Laterality: Left;  [place] 01:09.0AP% 23.7CDE 16.35FLUID PACK LOT # [id number] H    . CHOLECYSTECTOMY N/A 04/25/2016   Procedure: LAPAROSCOPIC CHOLECYSTECTOMY WITH INTRAOPERATIVE CHOLANGIOGRAM;  Surgeon: Kieth BrightlySeeplaputhur G Sankar, MD;  Location: [facility] ORS;  Service: General;  Laterality: N/A;  . CORONARY ANGIOPLASTY     STENT  . CORONARY ARTERY BYPASS GRAFT  2001  . KNEE SURGERY    . thyroid  1970  . UPPER GI ENDOSCOPY  2014   Family History  Problem Relation Age of Onset  . Heart failure Mother 1276  . Heart failure Sister 6848  . Heart attack Sister   . Heart attack Brother 42    Allergies: Bee pollen; Pollen extract; and Motrin [ibuprofen] Current Outpatient Medications on File Prior to Visit  Medication Sig Dispense Refill  . amLODipine (NORVASC) 5 MG tablet TAKE 1 TABLET BY MOUTH  DAILY 90 tablet 1  . aspirin 81 MG tablet Take 81 mg by mouth daily.      . carvedilol (COREG) 6.25 MG tablet TAKE 1 TABLET BY MOUTH  TWICE A DAY 180 tablet 1  . cloNIDine (CATAPRES) 0.1 MG tablet Take 1 tablet (0.1 mg total) by mouth 2 (two) times daily. 180 tablet 2  . Cyanocobalamin (VITAMIN B 12 PO) Take 3,000 Units by mouth daily.    . fluticasone (FLONASE) 50 MCG/ACT nasal  spray Place 2 sprays into both nostrils daily. 16 g 3  . levothyroxine (SYNTHROID, LEVOTHROID) 100 MCG tablet TAKE 1 TABLET BY MOUTH  DAILY BEFORE BREAKFAST 90 tablet 1  . losartan (COZAAR) 100 MG tablet TAKE 1 TABLET BY MOUTH  DAILY 90 tablet 1  . omeprazole (PRILOSEC) 40 MG capsule TAKE 1 CAPSULE BY MOUTH  EVERY EVENING 90 capsule 0  . pravastatin (PRAVACHOL) 20 MG tablet TAKE 1 TABLET BY MOUTH AT  BEDTIME 90 tablet 1   No current facility-administered medications on file prior to visit.     Social History   Tobacco Use  . Smoking status: Never Smoker  . Smokeless tobacco: Never Used  Substance Use Topics  . Alcohol use: No  . Drug use: No    Review of Systems  Constitutional: Positive for fatigue. Negative for chills and fever.  Respiratory: Negative for cough.   Cardiovascular: Negative for chest  pain and palpitations.  Gastrointestinal: Negative for nausea and vomiting.  Psychiatric/Behavioral: Positive for sleep disturbance. Negative for suicidal ideas.      Objective:    BP 136/62 (BP Location: Left Arm, Patient Position: Sitting, Cuff Size: Large)   Pulse (!) 58   Temp 97.8 F (36.6 C)   Wt 157 lb (71.2 kg)   SpO2 95%   BMI 29.66 kg/m  BP Readings from Last 3 Encounters:  07/29/18 136/62  06/11/18 (!) 162/80  04/03/18 138/78   Wt Readings from Last 3 Encounters:  07/29/18 157 lb (71.2 kg)  06/11/18 156 lb (70.8 kg)  04/03/18 158 lb 4 oz (71.8 kg)    Physical Exam Vitals signs reviewed.  Constitutional:      Appearance: She is well-developed.  Eyes:     Conjunctiva/sclera: Conjunctivae normal.  Cardiovascular:     Rate and Rhythm: Normal rate and regular rhythm.     Pulses: Normal pulses.     Heart sounds: Normal heart sounds.  Pulmonary:     Effort: Pulmonary effort is normal.     Breath sounds: Normal breath sounds. No wheezing, rhonchi or rales.  Skin:    General: Skin is warm and dry.  Neurological:     Mental Status: She is alert.  Psychiatric:        Speech: Speech normal.        Behavior: Behavior normal.        Thought Content: Thought content normal.        Assessment & Plan:   Problem List Items Addressed This Visit      Cardiovascular and Mediastinum   Essential hypertension - Primary    At goal with clonidine addition; no changes today.       Relevant Orders   CBC with Differential/Platelet   Comprehensive metabolic panel     Digestive   GERD (gastroesophageal reflux disease)    Symptoms controlled. Reviewed dr Johnney Killian note 09/2017.         Endocrine   Hypothyroidism    Compliant. Pending tsh      Relevant Orders   TSH     Other   Mixed hyperlipidemia   Relevant Orders   Hemoglobin A1c   Lipid panel   Bilateral leg edema    Well controlled currently. Will follow      Fatigue    Suspect multifactorial  including polypharmacy, clonidine, beta-blocker as well as lack of exercise, poor sleep quality.  Patient politely declines trial of trazodone to help with sleep.  Will continue to follow.  Pending labs  today.       Other Visit Diagnoses    Vitamin D overdose, accidental or unintentional, subsequent encounter       Relevant Orders   VITAMIN D 25 Hydroxy (Vit-D Deficiency, Fractures)       I am having Hartlyn W. Worley maintain her aspirin, Cyanocobalamin (VITAMIN B 12 PO), fluticasone, carvedilol, losartan, levothyroxine, omeprazole, pravastatin, amLODipine, and cloNIDine.   No orders of the defined types were placed in this encounter.   Return precautions given.   Risks, benefits, and alternatives of the medications and treatment plan prescribed today were discussed, and patient expressed understanding.   Education regarding symptom management and diagnosis given to patient on AVS.  Continue to follow with Allegra Grana, FNP for routine health maintenance.   Kayla Echevaria and I agreed with plan.   Rennie Plowman, FNP

## 2018-07-29 NOTE — Assessment & Plan Note (Signed)
Symptoms controlled. Reviewed dr Johnney Killian note 09/2017.

## 2018-07-29 NOTE — Patient Instructions (Signed)
Nice to see you!   

## 2018-07-29 NOTE — Assessment & Plan Note (Signed)
Compliant. Pending tsh. 

## 2018-07-29 NOTE — Assessment & Plan Note (Signed)
Suspect multifactorial including polypharmacy, clonidine, beta-blocker as well as lack of exercise, poor sleep quality.  Patient politely declines trial of trazodone to help with sleep.  Will continue to follow.  Pending labs today.

## 2018-07-29 NOTE — Assessment & Plan Note (Signed)
Well controlled currently. Will follow

## 2018-07-29 NOTE — Assessment & Plan Note (Signed)
At goal with clonidine addition; no changes today.

## 2018-08-19 ENCOUNTER — Ambulatory Visit
Admission: RE | Admit: 2018-08-19 | Discharge: 2018-08-19 | Disposition: A | Discharge status: Home / Self Care | Payer: Medicare Other | Source: Ambulatory Visit | Attending: Family | Admitting: Family

## 2018-08-19 DIAGNOSIS — Z13820 Encounter for screening for osteoporosis: Secondary | ICD-10-CM | POA: Diagnosis present

## 2018-08-19 DIAGNOSIS — M810 Age-related osteoporosis without current pathological fracture: Secondary | ICD-10-CM | POA: Insufficient documentation

## 2018-08-26 ENCOUNTER — Encounter: Payer: Self-pay | Admitting: Family

## 2018-08-26 ENCOUNTER — Other Ambulatory Visit: Payer: Self-pay | Admitting: Family

## 2018-08-26 ENCOUNTER — Ambulatory Visit (INDEPENDENT_AMBULATORY_CARE_PROVIDER_SITE_OTHER): Payer: Medicare Other | Admitting: Family

## 2018-08-26 VITALS — BP 140/62 | HR 66 | Temp 97.3°F | Wt 155.8 lb

## 2018-08-26 DIAGNOSIS — M810 Age-related osteoporosis without current pathological fracture: Secondary | ICD-10-CM

## 2018-08-26 DIAGNOSIS — M2550 Pain in unspecified joint: Secondary | ICD-10-CM

## 2018-08-26 DIAGNOSIS — I10 Essential (primary) hypertension: Secondary | ICD-10-CM

## 2018-08-26 MED ORDER — DICLOFENAC SODIUM 1 % TD GEL: 4.0000 g | Freq: Four times a day (QID) | TRANSDERMAL | 3 refills | Status: DC

## 2018-08-26 MED ORDER — LIDOCAINE 5 % EX PTCH: 1.0000 | MEDICATED_PATCH | CUTANEOUS | 0 refills | Status: DC

## 2018-08-26 MED ORDER — IBANDRONATE SODIUM 150 MG PO TABS: 150.0000 mg | ORAL_TABLET | ORAL | 0 refills | Status: DC

## 2018-08-26 NOTE — Assessment & Plan Note (Signed)
Benign exam. Advised tylenol arthritis, heat, voltaren gel, and lidocaine patches as needed. Declines orthopedic consult at this time. Will follow

## 2018-08-26 NOTE — Assessment & Plan Note (Signed)
At goal, limited by DBP. Will continue regimen.

## 2018-08-26 NOTE — Patient Instructions (Addendum)
Start boniva ONCE per month. Please read all instructions below.   Trial of voltaren gel and lidocaine for arthritic pain. Remember ice ice ice, especially for knees.  You may do this 3 times per day, 20 minutes per day.   Ibandronate monthly tablets What is this medicine? IBANDRONATE (i BAN droh nate) slows calcium loss from bones. It is used to treat osteoporosis in women past the age of menopause. This medicine may be used for other purposes; ask your health care provider or pharmacist if you have questions. COMMON BRAND NAME(S): Boniva What should I tell my health care provider before I take this medicine? They need to know if you have any of these conditions: -dental disease -esophageal, stomach, or intestine problems, like acid reflux or GERD -kidney disease -low blood calcium -low vitamin D -problems sitting or standing for 60 minutes -trouble swallowing -an unusual or allergic reaction to ibandronate, other medicines, foods, dyes, or preservatives -pregnant or trying to get pregnant -breast-feeding How should I use this medicine? You must take this medicine exactly as directed or you will lower the amount of medicine you absorb into your body or you may cause yourself harm. Take your dose by mouth first thing in the morning, after you are up for the day. Do not eat or drink anything before you take this medicine. Swallow the tablet with a full glass (6 to 8 ounces) of plain water. Do not take this medicine with any other drink. Do not chew or crush the tablet. After taking this medicine, do not eat breakfast, drink, or take any other medicines or vitamins for at least 1 hour. Stand or sit up for at least 1 hour after taking this medicine. Do not lie down. Take this medicine on the same day every month. Do not take your medicine more often than directed. Talk to your pediatrician regarding the use of this medicine in children. Special care may be needed. Overdosage: If you think you  have taken too much of this medicine contact a poison control center or emergency room at once. NOTE: This medicine is only for you. Do not share this medicine with others. What if I miss a dose? If you miss a dose and your next dose is more than 7 days away then take the missed dose on the next morning you remember. Then take your next dose on your regular day of the month. If your next dose is due within the next 7 days then skip the missed dose. Do not take 2 tablets within 1 week of each other. Do not take double or extra doses. What may interact with this medicine? -aluminum hydroxide -antacids -aspirin -calcium supplements -drugs for inflammation like ibuprofen, naproxen, and others -iron supplements -magnesium supplements -vitamins with minerals This list may not describe all possible interactions. Give your health care provider a list of all the medicines, herbs, non-prescription drugs, or dietary supplements you use. Also tell them if you smoke, drink alcohol, or use illegal drugs. Some items may interact with your medicine. What should I watch for while using this medicine? Visit your doctor or health care professional for regular check ups. It may be some time before you see the benefit from this medicine. Do not stop taking your medicine unless your doctor tells you to. Your doctor may order blood tests and other tests to see how you are doing. You should make sure that you get enough calcium and vitamin D while you are taking this medicine. Discuss  the foods you eat and the vitamins you take with your health care professional. Some people who take this medicine have severe bone, joint, and/or muscle pain. This medicine may also increase your risk for a broken thigh bone. Tell your doctor right away if you have pain in your upper leg or groin. Tell your doctor if you have any pain that does not go away or that gets worse. What side effects may I notice from receiving this  medicine? Side effects that you should report to your doctor or health care professional as soon as possible: -allergic reactions such as skin rash or itching, hives, swelling of the face, lips, throat or tongue -black or tarry stools -change in vision -chest pain -heartburn or stomach pain -jaw pain, especially after dental work -redness, blistering, peeling, or loosening of the skin, including inside the mouth -trouble or pain when swallowing Side effects that usually do not require medical attention (report to your doctor or health care professional if they continue or are bothersome): -bone, muscle or joint pain -changes in taste -diarrhea or constipation -headache -nausea or vomiting -stomach gas or fullness This list may not describe all possible side effects. Call your doctor for medical advice about side effects. You may report side effects to FDA at 1-800-FDA-1088. Where should I keep my medicine? Keep out of the reach of children. Store at room temperature between 15 and 30 degrees C (59 and 86 degrees F). Throw away any unused medication after the expiration date. NOTE: This sheet is a summary. It may not cover all possible information. If you have questions about this medicine, talk to your doctor, pharmacist, or health care provider.  2019 Elsevier/Gold Standard (2010-12-30 09:03:09)  Osteoporosis  Osteoporosis happens when your bones get thin and weak. This can cause your bones to break (fracture) more easily. You can do things at home to make your bones stronger. Follow these instructions at home:  Activity  Exercise as told by your doctor. Ask your doctor what activities are safe for you. You should do: ? Exercises that make your muscles work to hold your body weight up (weight-bearing exercises). These include tai chi, yoga, and walking. ? Exercises to make your muscles stronger. One example is lifting weights. Lifestyle  Limit alcohol intake to no more than 1  drink a day for nonpregnant women and 2 drinks a day for men. One drink equals 12 oz of beer, 5 oz of wine, or 1 oz of hard liquor.  Do not use any products that have nicotine or tobacco in them. These include cigarettes and e-cigarettes. If you need help quitting, ask your doctor. Preventing falls  Use tools to help you move around (mobility aids) as needed. These include canes, walkers, scooters, and crutches.  Keep rooms well-lit and free of clutter.  Put away things that could make you trip. These include cords and rugs.  Install safety rails on stairs. Install grab bars in bathrooms.  Use rubber mats in slippery areas, like bathrooms.  Wear shoes that: ? Fit you well. ? Support your feet. ? Have closed toes. ? Have rubber soles or low heels.  Tell your doctor about all of the medicines you are taking. Some medicines can make you more likely to fall. General instructions  Eat plenty of calcium and vitamin D. These nutrients are good for your bones. Good sources of calcium and vitamin D include: ? Some fatty fish, such as salmon and tuna. ? Foods that have calcium and  vitamin D added to them (fortified foods). For example, some breakfast cereals are fortified with calcium and vitamin D. ? Egg yolks. ? Cheese. ? Liver.  Take over-the-counter and prescription medicines only as told by your doctor.  Keep all follow-up visits as told by your doctor. This is important. Contact a doctor if:  You have not been tested (screened) for osteoporosis and you are: ? A woman who is age 3 or older. ? A man who is age 83 or older. Get help right away if:  You fall.  You get hurt. Summary  Osteoporosis happens when your bones get thin and weak.  Weak bones can break (fracture) more easily.  Eat plenty of calcium and vitamin D. These nutrients are good for your bones.  Tell your doctor about all of the medicines that you take. This information is not intended to replace advice  given to you by your health care provider. Make sure you discuss any questions you have with your health care provider. Document Released: 09/25/2011 Document Revised: 04/27/2017 Document Reviewed: 04/27/2017 Elsevier Interactive Patient Education  2019 [employer].

## 2018-08-26 NOTE — Assessment & Plan Note (Addendum)
crtcl 38 ml/min. Avoid boniva for CrtCl < 30. Discussed rare side effects including jaw necrosis, atypical femur fracture , which patient understood.  She decided she would like start boniva and recheck DEXA in 2 years. Information on how to take medication given and discussed with patient. Patient will let me know asap of dental procedures. Will follow.
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
## 2018-09-05 ENCOUNTER — Telehealth: Payer: Self-pay

## 2018-09-05 NOTE — Telephone Encounter (Signed)
I called patient to let her know PA for lidocaine patches was not approved. Patient stated that she got a letter stating that, but still received that patches. I asked that she try the Salonpas patches if she had any further issues. I explained that for these to be approved you have to have a very specific diagnosis. Patient verbalized understanding.

## 2018-11-13 ENCOUNTER — Ambulatory Visit: Payer: Self-pay

## 2018-11-13 NOTE — Telephone Encounter (Signed)
Incoming call from Patient reporting that she noticed that Blood pressure  has increased since April 25th 2020.  Reports the flowing values.  158/79 and 162/87.  Kayla [name] Patient states that she has not missed any doses of medication.  Denies headache , chest pain blurred vision, difficulty breathing, nor weakness.  Patient states that she just dosent "feel right in her head."  Related to Patient that office closed at 4pm.  Will send  telephon encouter over to office.  Expect a phone call in am.  Patient voiced understanding.  Reviewed protocol with Patient and provided care advice to Patient.  Voiced understanding Encourage to to go to ED or Urgent care tonight if Sx worsen overnight .  Voiced understanding.    Reason for Disposition . [1] Systolic BP  >= 180 OR Diastolic >= 110 AND [2] missed most recent dose of blood pressure medication  Answer Assessment - Initial Assessment Questions 1. BLOOD PRESSURE: "What is the blood pressure?" "Did you take at least two measurements 5 minutes apart?"   158/79  162/ 87 2. ONSET: "When did you take your blood pressure?"     The April 25th 3. HOW: "How did you obtain the blood pressure?" (e.g., visiting nurse, automatic home BP monitor)   automatic 4. HISTORY: "Do you have a history of high blood pressure?"    yes 5. MEDICATIONS: "Are you taking any medications for blood pressure?" "Have you missed any doses recently?"     no 6. OTHER SYMPTOMS: "Do you have any symptoms?" (e.g., headache, chest pain, blurred vision, difficulty breathing, weakness)     " didn't feel right in my head"   7. PREGNANCY: "Is there any chance you are pregnant?" "When was your last menstrual period?"     na  Protocols used: HIGH BLOOD PRESSURE-A-AH

## 2018-11-14 NOTE — Telephone Encounter (Signed)
Called and checked on pt about her BP, pt states she has not checked it this morning and she is feeling ok, no headaches or dizziness. I suggested a virtual visit with a provider and pt states no, she will give it a few days and if it is still elevated she will call for virtual appt.  Julya Alioto,cma

## 2018-11-24 ENCOUNTER — Other Ambulatory Visit: Payer: Self-pay | Admitting: Family

## 2018-11-24 DIAGNOSIS — I10 Essential (primary) hypertension: Secondary | ICD-10-CM

## 2018-11-24 DIAGNOSIS — E039 Hypothyroidism, unspecified: Secondary | ICD-10-CM

## 2018-11-24 DIAGNOSIS — I2510 Atherosclerotic heart disease of native coronary artery without angina pectoris: Secondary | ICD-10-CM

## 2018-12-04 ENCOUNTER — Other Ambulatory Visit: Payer: Self-pay | Admitting: Cardiovascular Disease

## 2018-12-04 DIAGNOSIS — I6529 Occlusion and stenosis of unspecified carotid artery: Secondary | ICD-10-CM

## 2018-12-23 ENCOUNTER — Encounter (HOSPITAL_COMMUNITY): Payer: Medicare Other

## 2019-01-22 ENCOUNTER — Ambulatory Visit
Admission: RE | Admit: 2019-01-22 | Discharge: 2019-01-22 | Disposition: A | Discharge status: Home / Self Care | Payer: Medicare Other | Source: Ambulatory Visit | Attending: Family | Admitting: Family

## 2019-01-22 DIAGNOSIS — Z1231 Encounter for screening mammogram for malignant neoplasm of breast: Secondary | ICD-10-CM | POA: Insufficient documentation

## 2019-01-27 ENCOUNTER — Ambulatory Visit (INDEPENDENT_AMBULATORY_CARE_PROVIDER_SITE_OTHER): Payer: Medicare Other | Admitting: Family

## 2019-01-27 ENCOUNTER — Other Ambulatory Visit: Payer: Self-pay

## 2019-01-27 ENCOUNTER — Encounter: Payer: Self-pay | Admitting: Family

## 2019-01-27 DIAGNOSIS — M810 Age-related osteoporosis without current pathological fracture: Secondary | ICD-10-CM

## 2019-01-27 DIAGNOSIS — I10 Essential (primary) hypertension: Secondary | ICD-10-CM | POA: Diagnosis not present

## 2019-01-27 NOTE — Assessment & Plan Note (Addendum)
Discussed Prolia however concerned she may/could also experience jaw pain on this medication. Patient's desire is not to treat OP at this time. She declines endocrine consult to discuss other options at this time nor does she feel that evaluation for LE strength,balance or orthopedics ( knee pain) warranted at this time. Education provided on ca and vit d supplementation.  She will let me know if knee pain becomes more bothersome or another fall were to occur.

## 2019-01-27 NOTE — Assessment & Plan Note (Signed)
BP machine currently broken. Purchasing new one and will send me readings from home. Will adjust once she send me readings.

## 2019-01-27 NOTE — Patient Instructions (Addendum)
Monitor blood pressure,  Goal is less than 140/80,if persistently higher, please make sooner follow up appointment so we can recheck you blood pressure and manage medications  Let me know if knees become more bothersome or if you would like orthopedic consult. Please let me know of any other falls.   For post menopausal women, guidelines recommend a diet with 1200 mg of Calcium per day. If you are eating calcium rich foods, you do not need a calcium supplement. The body better absorbs the calcium that you eat over supplementation. If you do supplement, I recommend not supplementing the full 1200 mg/ day as this can lead to increased risk of cardiovascular disease. I recommend Calcium Citrate over the counter, and you may take a total of 400 mg per day in divided doses with meals for best absorption.   For bone health, you need adequate vitamin D, and I recommend you supplement as it is harder to do so with diet alone. I recommend cholecalciferol 800 units daily.  Also, please ensure you are following a diet high in calcium -- research shows better outcomes with dietary sources including kale, yogurt, broccolii, cheese, okra, almonds- to name a few.    Also remember that exercise is a great medicine for maintain and preserve bone health. Advise moderate exercise for 30 minutes , 3 times per week.    Stay safe!

## 2019-01-27 NOTE — Progress Notes (Signed)
Verbal consent for services obtained from patient prior to services given.  Location of call:  provider at work patient at home  Names of all persons present for services: Mable Paris, NP Chief complaint:   Feels well. No complaints.   HTN- not sure of blood pressure as unable to check at home as machine broke; plans to re-order. 149/70 at home the last time , couple of weeks ago.   Denies exertional chest pain or pressure, numbness or tingling radiating to left arm or jaw, palpitations, dizziness, frequent headaches, changes in vision, or shortness of breath.   Osteoporosis - not on boniva as hurt jaws. Doesn't want to treat to osteoporosis. Fell on knees recently when moving trashcan. Wearing flip flops. 'Some off balance.'  Notes a lot of 'arthritis' in knees. On vitamin D QOD. No calcium.    Non smoker  Mammogram UTD  BP Readings from Last 3 Encounters:  08/26/18 140/62  07/29/18 136/62  06/11/18 (!) 162/80     A/P/next steps: Problem List Items Addressed This Visit      Cardiovascular and Mediastinum   Essential hypertension    BP machine currently broken. Purchasing new one and will send me readings from home. Will adjust once she send me readings.         Musculoskeletal and Integument   Osteoporosis    Discussed Prolia however concerned she may/could also experience jaw pain on this medication. Patient's desire is not to treat OP at this time. She declines endocrine consult to discuss other options at this time nor does she feel that evaluation for LE strength,balance or orthopedics ( knee pain) warranted at this time. Education provided on ca and vit d supplementation.  She will let me know if knee pain becomes more bothersome or another fall were to occur.           I spent 21 min  discussing plan of care over the phone.

## 2019-02-10 ENCOUNTER — Ambulatory Visit (INDEPENDENT_AMBULATORY_CARE_PROVIDER_SITE_OTHER): Payer: Medicare Other

## 2019-02-10 ENCOUNTER — Other Ambulatory Visit: Payer: Self-pay

## 2019-02-10 ENCOUNTER — Other Ambulatory Visit: Payer: Self-pay | Admitting: Cardiovascular Disease

## 2019-02-10 DIAGNOSIS — I6523 Occlusion and stenosis of bilateral carotid arteries: Secondary | ICD-10-CM

## 2019-02-10 DIAGNOSIS — I6529 Occlusion and stenosis of unspecified carotid artery: Secondary | ICD-10-CM | POA: Diagnosis not present

## 2019-02-17 ENCOUNTER — Telehealth: Payer: Self-pay | Admitting: *Deleted

## 2019-02-17 DIAGNOSIS — I6523 Occlusion and stenosis of bilateral carotid arteries: Secondary | ICD-10-CM

## 2019-02-17 NOTE — Telephone Encounter (Signed)
Results called to pt. Pt verbalized understanding. Repeat in 1 year carotid order entered.

## 2019-02-17 NOTE — Telephone Encounter (Signed)
-----   Message from Minna Merritts, MD sent at 02/17/2019  1:10 PM EDT ----- Carotid u/s right ICA are consistent with a 40-59% stenosis. Recheck in one year

## 2019-02-27 ENCOUNTER — Other Ambulatory Visit: Payer: Self-pay | Admitting: Cardiovascular Disease

## 2019-02-27 ENCOUNTER — Other Ambulatory Visit: Payer: Self-pay | Admitting: Family

## 2019-02-27 DIAGNOSIS — I10 Essential (primary) hypertension: Secondary | ICD-10-CM

## 2019-03-13 ENCOUNTER — Ambulatory Visit (INDEPENDENT_AMBULATORY_CARE_PROVIDER_SITE_OTHER): Payer: Medicare Other | Admitting: Family Medicine

## 2019-03-13 ENCOUNTER — Ambulatory Visit: Payer: Self-pay | Admitting: *Deleted

## 2019-03-13 ENCOUNTER — Other Ambulatory Visit: Payer: Self-pay

## 2019-03-13 ENCOUNTER — Encounter: Payer: Self-pay | Admitting: Family Medicine

## 2019-03-13 DIAGNOSIS — I10 Essential (primary) hypertension: Secondary | ICD-10-CM

## 2019-03-13 MED ORDER — CARVEDILOL 12.5 MG PO TABS: 12.5000 mg | ORAL_TABLET | Freq: Two times a day (BID) | ORAL | 0 refills | Status: DC

## 2019-03-13 NOTE — Progress Notes (Signed)
Subjective:    Patient ID: Kayla [name], female    DOB: 09-09-1926, 85 y.o.   MRN: [id number]  HPI  Patient presents to clinic due to elevated BP reading in the 180s/90s today. RN triage note reviewed.   Patient told RN she felt like she may pass out this AM, but then after a call back she said she no longer felt that way.  Patient states she usually will feel a little "off" every morning when she first wakes up usually starts her day with eating toast and having a couple coffee, states after she wakes up and eats usually starts to feel better and back to normal by lunchtime and that is what ended up happening today.  Patient already takes amlodipine 5 mg daily, Coreg 6.25 mg twice daily, clonidine plan 1 mg twice daily and losartan 100 mg daily for blood pressure control.  Previously she had been on amlodipine 10 mg however this made her legs swell so they cut the dose back down to 5 mg.  Denies chest pain, palpitations.  States her legs have not swollen any more than the usual that resolves with leg elevation.   No fever or chills.  No vomiting or diarrhea. No cough, SOB or wheezes.   Patient Active Problem List   Diagnosis Date Noted  . Arthralgia 08/26/2018  . Fatigue 07/29/2018  . Osteopenia 04/03/2018  . Neuropathy 04/03/2018  . Urinary incontinence 04/03/2018  . Hematuria 12/24/2017  . Skin lesion 12/24/2017  . Impacted cerumen of left ear 12/24/2017  . Allergic rhinitis 12/24/2017  . Osteoporosis 08/27/2017  . Hyponatremia 09/21/2016  . Chronic pain of right knee 08/09/2016  . Screening for breast cancer 08/09/2016  . Carotid stenosis 08/08/2016  . Chronic kidney disease 08/08/2016  . Hypothyroidism 08/08/2016  . Bursitis of hip 03/07/2016  . Bilateral leg edema 10/11/2015  . Aortic valve stenosis 04/12/2015  . Essential hypertension 01/09/2013  . Mixed hyperlipidemia 07/07/2011  . GERD (gastroesophageal reflux disease) 06/10/2010  . CAD, AUTOLOGOUS BYPASS GRAFT  04/15/2009  . LBBB (left bundle branch block) 04/15/2009   Social History   Tobacco Use  . Smoking status: Never Smoker  . Smokeless tobacco: Never Used  Substance Use Topics  . Alcohol use: No   Review of Systems   Constitutional: Negative for chills, fatigue and fever.  HENT: Negative for congestion, ear pain, sinus pain and sore throat.   Eyes: Negative.   Respiratory: Negative for cough, shortness of breath and wheezing.   Cardiovascular: Negative for chest pain, palpitations and leg swelling. +elevated BP Gastrointestinal: Negative for abdominal pain, diarrhea, nausea and vomiting.  Genitourinary: Negative for dysuria, frequency and urgency.  Musculoskeletal: Negative for arthralgias and myalgias.  Skin: Negative for color change, pallor and rash.  Neurological: Negative for syncope, light-headedness and headaches.  Psychiatric/Behavioral: The patient is not nervous/anxious.       Objective:   Physical Exam Vitals signs and nursing note reviewed.  Constitutional:      General: She is not in acute distress.    Appearance: She is not ill-appearing, toxic-appearing or diaphoretic.  HENT:     Head: Normocephalic and atraumatic.  Eyes:     General: No scleral icterus.    Extraocular Movements: Extraocular movements intact.     Pupils: Pupils are equal, round, and reactive to light.  Neck:     Musculoskeletal: Normal range of motion and neck supple. No neck rigidity.     Vascular: No carotid bruit.  Cardiovascular:  Rate and Rhythm: Normal rate and regular rhythm.  Pulmonary:     Effort: Pulmonary effort is normal. No respiratory distress.     Breath sounds: Normal breath sounds.  Musculoskeletal:     Comments: Some bilat LE swelling, chronic& at her normal baseline per patient and family member  Skin:    General: Skin is warm and dry.     Coloration: Skin is not jaundiced or pale.     Findings: No erythema.  Neurological:     General: No focal deficit present.      Mental Status: She is alert and oriented to person, place, and time. Mental status is at baseline.  Psychiatric:        Mood and Affect: Mood normal.        Behavior: Behavior normal.      BP Readings from Last 3 Encounters:  08/26/18 140/62  07/29/18 136/62  06/11/18 (!) 162/80   Today's Vitals   03/13/19 1554 03/13/19 1600  BP: (!) 182/78 (!) 144/72  Pulse: 70 76  Temp: (!) 97.4 F (36.3 C)   SpO2: 96%   Weight: 153 lb 6.4 oz (69.6 kg)    Body mass index is 28.98 kg/m.      Assessment & Plan:    A total of 25 minutes were spent face-to-face with the patient during this encounter and over half of that time was spent on counseling and coordination of care. The patient was counseled on medication option changes, drinking water, eating healthy.    Essential hypertension-discussed different options and medication changes we could consider with patient and family member.  Discussed possibility of increasing Norvasc back to 10 mg/day, patient does not want to do this as that caused her to swell.  Losartan is already at a higher dose of 100 mg/day.  We decided and agreed to trial increasing the Coreg to 12.5 mg twice daily and monitor closely as to how the blood pressure response.  Patient will check blood pressure at home at least once a day and keep a log of readings.  We will have her come back to office in 1 or 2 weeks for follow-up to see how the increase in Coreg has affected blood pressure.  Also had long discussion with patient regards to drinking water throughout the day to keep up hydration, eating healthy foods throughout the day, doing exercises even while sitting like arm raises, moving feet and ankles just to help keep body and circulation active.  Patient will follow-up in 1 to 2 weeks, appointment will be made upon checkout.

## 2019-03-13 NOTE — Telephone Encounter (Signed)
Pt called in c/o her BP being high, feeling like she is going to pass out and has a headache.   Her BP was 110/63 this morning at 9:30.   "My BP goes up and down".    Not missed any doses of BP medication when asked    I had her recheck her BP and it was 186/91.   "I don't know if this thing is right or not".    "My daughter put new batteries in it and said it was measuring her BP right".    I asked her if she feels dizzy when she walks around.   "I don't know".   "I'm still down".   I asked her what she meant and she replied,   "I'm still in my recliner".    I asked her to get up and see if she is still feeling like she is going to pass out.   Hold onto the recliner.    She got up and said,  "I don't feel like  passing out now".     "I just want to see the doctor about my BP". I called into [facility] office and spoke with Butch Penny.    Due to pt feeling like she is going to pass out she needed to go to the ED which I agree also.  When I got back on the line with the pt and let her know she needed to go to the ED she said,   "I'm not going to no emergency room".    "I just need to see the doctor about my BP".    "I don't feel like passing out now".     I called back into the office and spoke with Butch Penny again and informed her of pt refusing to go to the ED and "not feeling like she is going to pass out".    Butch Penny is going to schedule her with the NP.  I forwarded my notes to the office.   Hypertension protocol was used due to pt saying her BP was low then saying it was high.  Checked BP for me it was 186/91 so went with hypertension protocol.   Reason for Disposition . [7] Systolic BP  >= 353 OR Diastolic >= 80 AND [2] taking BP medications  Answer Assessment - Initial Assessment Questions 1. BLOOD PRESSURE: "What is the blood pressure?" "Did you take at least two measurements 5 minutes apart?"     110/63 this morning.    I feel like I might pass out.   2. ONSET: "When did you take your  blood pressure?"     This morning at 9:30 3. HOW: "How did you obtain the blood pressure?" (e.g., visiting nurse, automatic home BP monitor)     Automatic cuff 4. HISTORY: "Do you have a history of high blood pressure?"     Yes 5. MEDICATIONS: "Are you taking any medications for blood pressure?" "Have you missed any doses recently?"     Yes 6. OTHER SYMPTOMS: "Do you have any symptoms?" (e.g., headache, chest pain, blurred vision, difficulty breathing, weakness)     Headache, feel like I may pass out.    On July 13 I let her know my BP has been running high for a month. No chest pain or shortness of breath.     My feet and legs are swollen.   My feet stay swollen all the time. 7. PREGNANCY: "Is there any chance you are pregnant?" "When was  your last menstrual period?"     N/A  Protocols used: HIGH BLOOD PRESSURE-A-AH

## 2019-03-13 NOTE — Patient Instructions (Signed)
Increase coreg to the 12.5 mg 2x per day, 1 month supply sent in of this dose for a trial  Check BP at home every day  Or every other day for next week and keep log

## 2019-03-13 NOTE — Telephone Encounter (Signed)
Pt seeing lauren this PM

## 2019-03-21 ENCOUNTER — Telehealth: Payer: Self-pay | Admitting: *Deleted

## 2019-03-21 ENCOUNTER — Other Ambulatory Visit: Payer: Self-pay

## 2019-03-21 ENCOUNTER — Encounter: Payer: Self-pay | Admitting: Family

## 2019-03-21 ENCOUNTER — Ambulatory Visit (INDEPENDENT_AMBULATORY_CARE_PROVIDER_SITE_OTHER): Payer: Medicare Other | Admitting: Family

## 2019-03-21 VITALS — BP 180/80 | HR 69 | Temp 98.3°F | Wt 151.8 lb

## 2019-03-21 DIAGNOSIS — I10 Essential (primary) hypertension: Secondary | ICD-10-CM

## 2019-03-21 DIAGNOSIS — R079 Chest pain, unspecified: Secondary | ICD-10-CM | POA: Insufficient documentation

## 2019-03-21 MED ORDER — ISOSORBIDE MONONITRATE ER 30 MG PO TB24: 30.0000 mg | ORAL_TABLET | Freq: Every day | ORAL | 1 refills | Status: DC

## 2019-03-21 MED ORDER — NITROGLYCERIN 0.4 MG SL SUBL: 0.4000 mg | SUBLINGUAL_TABLET | SUBLINGUAL | 1 refills | Status: AC | PRN

## 2019-03-21 MED ORDER — FAMOTIDINE 20 MG PO TABS: 20.0000 mg | ORAL_TABLET | Freq: Two times a day (BID) | ORAL | 1 refills | Status: DC

## 2019-03-21 NOTE — Assessment & Plan Note (Addendum)
2 self-limiting episodes of chest pain for the past 2 nights which occurred while laying down to go to sleep.  Nonexertional. Blood pressure is uncontrolled. Reviewed EKG with Dr.Gollan today.  No acute ischemia, left bundle branch block and LVH are unchanged  we jointly agreed did not seem cardiac in nature.  We did think it was appropriate for patient to have a prescription for nitro which I given her today and explained how to use.  We will also pursue renal ultrasound done at [facility] office, labs.  She will start Imdur and continue to monitor blood pressure at home.  She will let us know of any headaches. F/u in one week.

## 2019-03-21 NOTE — Telephone Encounter (Signed)
-----   Message from Burnard Hawthorne, [place] sent at 03/21/2019  4:03 PM EDT ----- Hi Pia Jedlicka! I spoke with Dr Rockey Situ today and we wanted to have a US renal done through your office. I couldn't find order with the clinic listed. Can you help me with getting ordered/scheduled please? Patient is aware. Thanks so much Ezelle Surprenant!margaret

## 2019-03-21 NOTE — Telephone Encounter (Signed)
Spoke with patients daughter per release form and reviewed that providers want her to have renal ultrasound done here in our office. She requested to have that done before the 15th if at all possible due to her having surgery. Order entered and will send to scheduling for them to call first thing next week. Reviewed instructions for procedure:  o No food after midnight the day before  o Avoid carbonated beverages, chewing gum, smoking  o Take normal medications with a small amount of water  Instructed her to please call back on Tuesday evening if she has not received a call yet. She verbalized understanding with no further questions at this time.

## 2019-03-21 NOTE — Assessment & Plan Note (Signed)
See chest pain note.

## 2019-03-21 NOTE — Progress Notes (Signed)
Subjective:    Patient ID: Kayla [name], female    DOB: [birth year], 85 y.o.   MRN: [id number]  CC: Kayla [name] is a 85 y.o. female who presents today for follow up.   HPI: HTN- compliant medication.  At home over the past week values have ranged from 179/86, HR 66, 139/79 with a heart rate of 60, 148/84 with a heart rate of 62, 173/85 , HR 57. Increase Coreg to 12.5 mg twice daily. She does note that over the past 2 nights she has had central chest pain while laying down at night.  Describes it as a "pressure".  Lasts for couple seconds then resolves.  She denies any exertional chest pain with activity, nausea, diaphoresis, headache, vision changes, left arm numbness. She also denies increased anxiety of late although a niece of hers passed away 2 months ago.  She denies any epigastric burning, regurgitation.  She denies lifting anything heavy.   HISTORY:  Past Medical History:  Diagnosis Date  . Anginal pain ([place])   . Arthritis   . Bundle branch block, left    Chronic  . CAD (coronary artery disease)   . Cholecystitis    from record  . Emphysema lung ([place])   . GERD (gastroesophageal reflux disease)   . Heart murmur   . HOH (hard of hearing)   . Hyperlipidemia   . Hypertension   . Hypothyroidism   . Orthostatic headache    Mild  . Pain    HIP AND LEG PAIN USES  CANE  . Vertigo    Past Surgical History:  Procedure Laterality Date  . APPENDECTOMY  1957  . BREAST BIOPSY Left 2011   neg, stereo done by Dr. Jamal Collin  . BREAST BIOPSY    . CARDIAC CATHETERIZATION  2000   [facility]  . CATARACT EXTRACTION Right    right 2004, left 2018   . CATARACT EXTRACTION W/PHACO Left 04/12/2017   Procedure: CATARACT EXTRACTION PHACO AND INTRAOCULAR LENS PLACEMENT (IOC);  Surgeon: Leandrew Koyanagi, MD;  Location: [facility] ORS;  Service: Ophthalmology;  Laterality: Left;  [place] 01:09.0AP% 23.7CDE 16.35FLUID PACK LOT # [id number]  . CHOLECYSTECTOMY N/A 04/25/2016   Procedure: LAPAROSCOPIC  CHOLECYSTECTOMY WITH INTRAOPERATIVE CHOLANGIOGRAM;  Surgeon: Christene Lye, MD;  Location: [facility] ORS;  Service: General;  Laterality: N/A;  . CORONARY ANGIOPLASTY     STENT  . CORONARY ARTERY BYPASS GRAFT  2001  . KNEE SURGERY    . thyroid  1970  . UPPER GI ENDOSCOPY  2014   Family History  Problem Relation Age of Onset  . Heart failure Mother 39  . Heart failure Sister 63  . Heart attack Sister   . Heart attack Brother 42    Allergies: Bee pollen, Boniva [ibandronic acid], Pollen extract, and Motrin [ibuprofen] Current Outpatient Medications on File Prior to Visit  Medication Sig Dispense Refill  . amLODipine (NORVASC) 5 MG tablet TAKE 1 TABLET BY MOUTH  DAILY 90 tablet 1  . aspirin 81 MG tablet Take 81 mg by mouth daily.      . carvedilol (COREG) 12.5 MG tablet Take 1 tablet (12.5 mg total) by mouth 2 (two) times daily. 60 tablet 0  . cloNIDine (CATAPRES) 0.1 MG tablet TAKE 1 TABLET BY MOUTH TWO  TIMES DAILY 180 tablet 0  . Cyanocobalamin (VITAMIN B 12 PO) Take 3,000 Units by mouth daily.    . fluticasone (FLONASE) 50 MCG/ACT nasal spray Place 2 sprays into both nostrils daily. 16 g  3  . levothyroxine (SYNTHROID) 100 MCG tablet TAKE 1 TABLET BY MOUTH  DAILY BEFORE BREAKFAST 90 tablet 1  . losartan (COZAAR) 100 MG tablet TAKE 1 TABLET BY MOUTH  DAILY 90 tablet 3  . pravastatin (PRAVACHOL) 20 MG tablet TAKE 1 TABLET BY MOUTH AT  BEDTIME 90 tablet 1   No current facility-administered medications on file prior to visit.     Social History   Tobacco Use  . Smoking status: Never Smoker  . Smokeless tobacco: Never Used  Substance Use Topics  . Alcohol use: No  . Drug use: No    Review of Systems  Constitutional: Negative for chills and fever.  Eyes: Negative for visual disturbance.  Respiratory: Negative for cough and shortness of breath.   Cardiovascular: Positive for chest pain. Negative for palpitations.  Gastrointestinal: Negative for nausea and vomiting.   Neurological: Negative for numbness and headaches.      Objective:    BP (!) 180/80 Comment: right arm  Pulse 69   Temp 98.3 F (36.8 C)   Wt 151 lb 12.8 oz (68.9 kg)   SpO2 97%   BMI 28.68 kg/m  BP Readings from Last 3 Encounters:  03/21/19 (!) 180/80  03/13/19 (!) 144/72  08/26/18 140/62   Wt Readings from Last 3 Encounters:  03/21/19 151 lb 12.8 oz (68.9 kg)  03/13/19 153 lb 6.4 oz (69.6 kg)  08/26/18 155 lb 12.8 oz (70.7 kg)    Physical Exam Vitals signs reviewed.  Constitutional:      Appearance: She is well-developed.  HENT:     Mouth/Throat:     Pharynx: Uvula midline.  Eyes:     Conjunctiva/sclera: Conjunctivae normal.     Pupils: Pupils are equal, round, and reactive to light.     Comments: Fundus normal bilaterally.   Cardiovascular:     Rate and Rhythm: Normal rate and regular rhythm.     Pulses: Normal pulses.     Heart sounds: Normal heart sounds.  Pulmonary:     Effort: Pulmonary effort is normal.     Breath sounds: Normal breath sounds. No wheezing, rhonchi or rales.  Chest:     Chest wall: No tenderness.  Skin:    General: Skin is warm and dry.  Neurological:     Mental Status: She is alert.     Cranial Nerves: No cranial nerve deficit.     Sensory: No sensory deficit.     Deep Tendon Reflexes:     Reflex Scores:      Bicep reflexes are 2+ on the right side and 2+ on the left side.      Patellar reflexes are 2+ on the right side and 2+ on the left side.    Comments: Grip equal and strong bilateral upper extremities. Gait strong and steady. Able to perform rapid alternating movement without difficulty.   Psychiatric:        Speech: Speech normal.        Behavior: Behavior normal.        Thought Content: Thought content normal.        Assessment & Plan:   Problem List Items Addressed This Visit      Cardiovascular and Mediastinum   Essential hypertension - Primary    See chest pain note.       Relevant Medications   nitroGLYCERIN  (NITROSTAT) 0.4 MG SL tablet   isosorbide mononitrate (IMDUR) 30 MG 24 hr tablet   Other Relevant Orders   Basic  metabolic panel     Other   Chest pain    2 self-limiting episodes of chest pain for the past 2 nights which occurred while laying down to go to sleep.  Nonexertional. Blood pressure is uncontrolled. Reviewed EKG with Dr.Gollan today.  No acute ischemia, left bundle branch block and LVH are unchanged  we jointly agreed did not seem cardiac in nature.  We did think it was appropriate for patient to have a prescription for nitro which I given her today and explained how to use.  [facility]  She will start Imdur and continue to monitor blood pressure at home.  She will let us know of any headaches. F/u in one week.       Relevant Orders   EKG 12-Lead (Completed)       I am having Ellenore W. Ordoyne start on nitroGLYCERIN and isosorbide mononitrate. I am also having her maintain her aspirin, Cyanocobalamin (VITAMIN B 12 PO), fluticasone, levothyroxine, pravastatin, amLODipine, cloNIDine, losartan, and carvedilol.   Meds ordered this encounter  Medications  . nitroGLYCERIN (NITROSTAT) 0.4 MG SL tablet    Sig: Place 1 tablet (0.4 mg total) under the tongue every 5 (five) minutes as needed for chest pain.    Dispense:  25 tablet    Refill:  1    Order Specific Question:   Supervising Provider    Answer:   Duncan DullULLO, TERESA L [[id number]]  . isosorbide mononitrate (IMDUR) 30 MG 24 hr tablet    Sig: Take 1 tablet (30 mg total) by mouth daily.    Dispense:  90 tablet    Refill:  1    Order Specific Question:   Supervising Provider    Answer:   Sherlene ShamsULLO, TERESA L [[id number]]    Return precautions given.   Risks, benefits, and alternatives of the medications and treatment plan prescribed today were discussed, and patient expressed understanding.   Education regarding symptom management and diagnosis given to patient on AVS.  Continue to  follow with Allegra GranaArnett, Kyng Matlock G, FNP for routine health maintenance.   Candie EchevariaMargie W Wilds and I agreed with plan.   Rennie PlowmanMargaret Jillien Yakel, FNP

## 2019-03-21 NOTE — Patient Instructions (Signed)
Start imdur  Monitor for dizziness, headache Certainly if any chest pain, you need to go to nearest emergency room.   We will work on scheduling ultrasound of your kidneys via Dr Rockey Situ; please let me know if you do not hear from [place] or them regarding this.   Make follow up in one week. Bring blood pressure cuff.

## 2019-03-22 LAB — BASIC METABOLIC PANEL
BUN/Creatinine Ratio: 21 (calc) (ref 6–22)
BUN: 22 mg/dL (ref 7–25)
CO2: 23 mmol/L (ref 20–32)
Calcium: 9.6 mg/dL (ref 8.6–10.4)
Chloride: 100 mmol/L (ref 98–110)
Creat: 1.07 mg/dL — ABNORMAL HIGH (ref 0.60–0.88)
Glucose, Bld: 97 mg/dL (ref 65–99)
Potassium: 4.1 mmol/L (ref 3.5–5.3)
Sodium: 136 mmol/L (ref 135–146)

## 2019-03-25 NOTE — Telephone Encounter (Signed)
No openings in [place] [address] .  Patient daughter declined sooner in [place] and wants this done asap.  Please call with an alternative.

## 2019-03-25 NOTE — Telephone Encounter (Signed)
Called and spoke with patients daughter per release form and reviewed that we don't have anything open before that date but she explained that she is having surgery and needs to have it done before the 15th in order to bring her for appointment. Advised that I would need to check on this and would be in touch if I find another option. She verbalized understanding with no further questions.

## 2019-03-25 NOTE — Telephone Encounter (Signed)
Spoke with patients daughter per release form and reviewed that we have a opening tomorrow at 10:30AM. She was agreeable with this time and reviewed instructions for ultrasound scheduled tomorrow. She was appreciative for the call back with no further questions at this time.

## 2019-03-26 ENCOUNTER — Other Ambulatory Visit: Payer: Self-pay | Admitting: Family

## 2019-03-26 ENCOUNTER — Ambulatory Visit (INDEPENDENT_AMBULATORY_CARE_PROVIDER_SITE_OTHER): Payer: Medicare Other

## 2019-03-26 ENCOUNTER — Other Ambulatory Visit: Payer: Self-pay

## 2019-03-26 DIAGNOSIS — I10 Essential (primary) hypertension: Secondary | ICD-10-CM

## 2019-03-26 NOTE — Progress Notes (Signed)
Subjective:    Patient ID: Kayla [name], female    DOB: [birth year], 85 y.o.   MRN: [id number]  CC: Kayla [name] is a 85 y.o. female who presents today for follow up.   HPI: Accompanied by daughter  HTN- Started amlodipine 10mg  and stopped Imdur 5 days ago after syncopal episode. Feels well today, no complaints. She does note trace swelling in BL ankles however 'not bothersome at this time'.   States she spoke with our on-call provider ( Not Kayla [name] office) whom advised her to start amlodipine 10mg  and stop Imdur ( no records of this in Epic)  At home readings have varied from 155/88, heart rate of 58-1 91/84, heart rate of 66, 142/75, heart rate 76, 156/76, 64 2 days ago-  136/69, heart rate 58, 1 day ago-115/60, heart rate 58 Today at home 177/85, HR 64 however notes that she hadnt slept well the night before.   She describes taking a bath in in which started to 'feel faint'; she got out the bathtub and sat on the toilet where she fainted . She woke up on the floor and felt on left shoulder. No head injury and states brief LOC.   No h/o seizure and no loss of urine. No CP, palpitations, left arm numbness.   EMS was called. Her BP was 135/66 30 minutes after syncope.   Notes shoulder had bothered her at first, since improved. No bruising, or reduction of ROM.  No salt. No snoring.        HISTORY:  Past Medical History:  Diagnosis Date   Anginal pain (HCC)    Arthritis    Bundle branch block, left    Chronic   CAD (coronary artery disease)    Cholecystitis    from record   Emphysema lung (HCC)    GERD (gastroesophageal reflux disease)    Heart murmur    HOH (hard of hearing)    Hyperlipidemia    Hypertension    Hypothyroidism    Orthostatic headache    Mild   Pain    HIP AND LEG PAIN USES  CANE   Vertigo    Past Surgical History:  Procedure Laterality Date   APPENDECTOMY  1957   BREAST BIOPSY Left 2011   neg, stereo done by Kayla.  Evette Cristal   BREAST BIOPSY     CARDIAC CATHETERIZATION  2000   [facility]   CATARACT EXTRACTION Right    right 2004, left 2018    CATARACT EXTRACTION W/PHACO Left 04/12/2017   Procedure: CATARACT EXTRACTION PHACO AND INTRAOCULAR LENS PLACEMENT (IOC);  Surgeon: Lockie Mola, MD;  Location: [facility] ORS;  Service: Ophthalmology;  Laterality: Left;  [place] 01:09.0AP% 23.7CDE 16.35FLUID PACK LOT # [id number] H   CHOLECYSTECTOMY N/A 04/25/2016   Procedure: LAPAROSCOPIC CHOLECYSTECTOMY WITH INTRAOPERATIVE CHOLANGIOGRAM;  Surgeon: Kieth Brightly, MD;  Location: [facility] ORS;  Service: General;  Laterality: N/A;   CORONARY ANGIOPLASTY     STENT   CORONARY ARTERY BYPASS GRAFT  2001   KNEE SURGERY     thyroid  1970   UPPER GI ENDOSCOPY  2014   Family History  Problem Relation Age of Onset   Heart failure Mother 33   Heart failure Sister 1   Heart attack Sister    Heart attack Brother 12    Allergies: Bee pollen, Boniva [ibandronic acid], Pollen extract, and Motrin [ibuprofen] Current Outpatient Medications on File Prior to Visit  Medication Sig Dispense Refill   aspirin 81 MG tablet  Take 81 mg by mouth daily.       carvedilol (COREG) 12.5 MG tablet Take 1 tablet (12.5 mg total) by mouth 2 (two) times daily. 60 tablet 0   cloNIDine (CATAPRES) 0.1 MG tablet TAKE 1 TABLET BY MOUTH TWO  TIMES DAILY 180 tablet 0   Cyanocobalamin (VITAMIN B 12 PO) Take 3,000 Units by mouth daily.     famotidine (PEPCID) 20 MG tablet Take 1 tablet (20 mg total) by mouth 2 (two) times daily. 90 tablet 1   fluticasone (FLONASE) 50 MCG/ACT nasal spray Place 2 sprays into both nostrils daily. 16 g 3   levothyroxine (SYNTHROID) 100 MCG tablet TAKE 1 TABLET BY MOUTH  DAILY BEFORE BREAKFAST 90 tablet 1   losartan (COZAAR) 100 MG tablet TAKE 1 TABLET BY MOUTH  DAILY 90 tablet 3   nitroGLYCERIN (NITROSTAT) 0.4 MG SL tablet Place 1 tablet (0.4 mg total) under the tongue every 5 (five) minutes as needed for chest  pain. 25 tablet 1   pravastatin (PRAVACHOL) 20 MG tablet TAKE 1 TABLET BY MOUTH AT  BEDTIME 90 tablet 1   No current facility-administered medications on file prior to visit.     Social History   Tobacco Use   Smoking status: Never Smoker   Smokeless tobacco: Never Used  Substance Use Topics   Alcohol use: No   Drug use: No    Review of Systems  Constitutional: Negative for chills and fever.  Eyes: Negative for visual disturbance.  Respiratory: Negative for cough and shortness of breath.   Cardiovascular: Positive for leg swelling (trace BL). Negative for chest pain and palpitations.  Gastrointestinal: Negative for nausea and vomiting.  Neurological: Positive for syncope. Negative for headaches.      Objective:    BP (!) 150/60 Comment: left arm   Pulse 73    Temp 98.2 F (36.8 C)    Wt 156 lb 3.2 oz (70.9 kg)    SpO2 95%    BMI 29.51 kg/m  BP Readings from Last 3 Encounters:  03/28/19 (!) 150/60  03/21/19 (!) 180/80  03/13/19 (!) 144/72   Wt Readings from Last 3 Encounters:  03/28/19 156 lb 3.2 oz (70.9 kg)  03/21/19 151 lb 12.8 oz (68.9 kg)  03/13/19 153 lb 6.4 oz (69.6 kg)    Physical Exam Vitals signs reviewed.  Constitutional:      Appearance: She is well-developed.  Eyes:     Conjunctiva/sclera: Conjunctivae normal.  Cardiovascular:     Rate and Rhythm: Normal rate and regular rhythm.     Pulses: Normal pulses.     Heart sounds: Normal heart sounds.  Pulmonary:     Effort: Pulmonary effort is normal.     Breath sounds: Normal breath sounds. No wheezing, rhonchi or rales.  Skin:    General: Skin is warm and dry.  Neurological:     Mental Status: She is alert.  Psychiatric:        Speech: Speech normal.        Behavior: Behavior normal.        Thought Content: Thought content normal.        Assessment & Plan:   Problem List Items Addressed This Visit      Cardiovascular and Mediastinum   Essential hypertension - Primary    Improved  while rested in room. DBP is limiting as well as concern after syncopal episode. Suspect vasovagal syncopal episode likely exacerbated by Imdur as she had had her first dose the  day of syncopal episode.   She has been on 10mg  norvasc for 5 days and I can see trend of improvement (with exception of this morning however she does note poor sleep) We jointly agreed that we would give it a few more days prior to escalation of regimen. She will also monitor for leg swelling ( this has occurred in past on 10mg  amlodipine).  Alternatively we discussed trial of HCTZ if swelling problematic and returning patient to 5 mg norvac. She had electrolyte disturbances on HCTZ in the past so I advised that we would need to monitor VERY closely and stay on low dose.  Close follow up.         Relevant Medications   amLODipine (NORVASC) 10 MG tablet       I have discontinued Tahirah W. Brocato's amLODipine. I have also changed her amLODipine. Additionally, I am having her maintain her aspirin, Cyanocobalamin (VITAMIN B 12 PO), fluticasone, levothyroxine, pravastatin, cloNIDine, losartan, carvedilol, famotidine, and nitroGLYCERIN.   Meds ordered this encounter  Medications   amLODipine (NORVASC) 10 MG tablet    Sig: Take 1 tablet (10 mg total) by mouth daily.    Dispense:  90 tablet    Refill:  0    Order Specific Question:   Supervising Provider    Answer:   Crecencio Mc [[id number]]    Return precautions given.   Risks, benefits, and alternatives of the medications and treatment plan prescribed today were discussed, and patient expressed understanding.   Education regarding symptom management and diagnosis given to patient on AVS.  Continue to follow with Burnard Hawthorne, FNP for routine health maintenance.   [name] and I agreed with plan.   Mable Paris, FNP

## 2019-03-28 ENCOUNTER — Encounter: Payer: Self-pay | Admitting: Family

## 2019-03-28 ENCOUNTER — Other Ambulatory Visit: Payer: Self-pay

## 2019-03-28 ENCOUNTER — Ambulatory Visit (INDEPENDENT_AMBULATORY_CARE_PROVIDER_SITE_OTHER): Payer: Medicare Other | Admitting: Family

## 2019-03-28 VITALS — BP 150/60 | HR 73 | Temp 98.2°F | Wt 156.2 lb

## 2019-03-28 DIAGNOSIS — I10 Essential (primary) hypertension: Secondary | ICD-10-CM | POA: Diagnosis not present

## 2019-03-28 MED ORDER — AMLODIPINE BESYLATE 10 MG PO TABS: 10.0000 mg | ORAL_TABLET | Freq: Every day | ORAL | 0 refills | Status: DC

## 2019-03-28 NOTE — Patient Instructions (Signed)
Stay on norvasc 10mg  for now with close vigilance to leg swelling.   Goal is to get closer to less than 140/80. Please monitor and call with any concerns or escalations of blood pressure.   Managing Your Hypertension Hypertension is commonly called high blood pressure. This is when the force of your blood pressing against the walls of your arteries is too strong. Arteries are blood vessels that carry blood from your heart throughout your body. Hypertension forces the heart to work harder to pump blood, and may cause the arteries to become narrow or stiff. Having untreated or uncontrolled hypertension can cause heart attack, stroke, kidney disease, and other problems. What are blood pressure readings? A blood pressure reading consists of a higher number over a lower number. Ideally, your blood pressure should be below 120/80. The first ("top") number is called the systolic pressure. It is a measure of the pressure in your arteries as your heart beats. The second ("bottom") number is called the diastolic pressure. It is a measure of the pressure in your arteries as the heart relaxes. What does my blood pressure reading mean? Blood pressure is classified into four stages. Based on your blood pressure reading, your health care provider may use the following stages to determine what type of treatment you need, if any. Systolic pressure and diastolic pressure are measured in a unit called mm Hg. Normal  Systolic pressure: below 062.  Diastolic pressure: below 80. Elevated  Systolic pressure: 376-283.  Diastolic pressure: below 80. Hypertension stage 1  Systolic pressure: 151-761.  Diastolic pressure: 60-73. Hypertension stage 2  Systolic pressure: 710 or above.  Diastolic pressure: 90 or above. What health risks are associated with hypertension? Managing your hypertension is an important responsibility. Uncontrolled hypertension can lead to:  A heart attack.  A stroke.  A weakened  blood vessel (aneurysm).  Heart failure.  Kidney damage.  Eye damage.  Metabolic syndrome.  Memory and concentration problems. What changes can I make to manage my hypertension? Hypertension can be managed by making lifestyle changes and possibly by taking medicines. Your health care provider will help you make a plan to bring your blood pressure within a normal range. Eating and drinking   Eat a diet that is high in fiber and potassium, and low in salt (sodium), added sugar, and fat. An example eating plan is called the DASH (Dietary Approaches to Stop Hypertension) diet. To eat this way: ? Eat plenty of fresh fruits and vegetables. Try to fill half of your plate at each meal with fruits and vegetables. ? Eat whole grains, such as whole wheat pasta, brown rice, or whole grain bread. Fill about one quarter of your plate with whole grains. ? Eat low-fat diary products. ? Avoid fatty cuts of meat, processed or cured meats, and poultry with skin. Fill about one quarter of your plate with lean proteins such as fish, chicken without skin, beans, eggs, and tofu. ? Avoid premade and processed foods. These tend to be higher in sodium, added sugar, and fat.  Reduce your daily sodium intake. Most people with hypertension should eat less than 1,500 mg of sodium a day.  Limit alcohol intake to no more than 1 drink a day for nonpregnant women and 2 drinks a day for men. One drink equals 12 oz of beer, 5 oz of wine, or 1 oz of hard liquor. Lifestyle  Work with your health care provider to maintain a healthy body weight, or to lose weight. Ask what an  ideal weight is for you.  Get at least 30 minutes of exercise that causes your heart to beat faster (aerobic exercise) most days of the week. Activities may include walking, swimming, or biking.  Include exercise to strengthen your muscles (resistance exercise), such as weight lifting, as part of your weekly exercise routine. Try to do these types of  exercises for 30 minutes at least 3 days a week.  Do not use any products that contain nicotine or tobacco, such as cigarettes and e-cigarettes. If you need help quitting, ask your health care provider.  Control any long-term (chronic) conditions you have, such as high cholesterol or diabetes. Monitoring  Monitor your blood pressure at home as told by your health care provider. Your personal target blood pressure may vary depending on your medical conditions, your age, and other factors.  Have your blood pressure checked regularly, as often as told by your health care provider. Working with your health care provider  Review all the medicines you take with your health care provider because there may be side effects or interactions.  Talk with your health care provider about your diet, exercise habits, and other lifestyle factors that may be contributing to hypertension.  Visit your health care provider regularly. Your health care provider can help you create and adjust your plan for managing hypertension. Will I need medicine to control my blood pressure? Your health care provider may prescribe medicine if lifestyle changes are not enough to get your blood pressure under control, and if:  Your systolic blood pressure is 130 or higher.  Your diastolic blood pressure is 80 or higher. Take medicines only as told by your health care provider. Follow the directions carefully. Blood pressure medicines must be taken as prescribed. The medicine does not work as well when you skip doses. Skipping doses also puts you at risk for problems. Contact a health care provider if:  You think you are having a reaction to medicines you have taken.  You have repeated (recurrent) headaches.  You feel dizzy.  You have swelling in your ankles.  You have trouble with your vision. Get help right away if:  You develop a severe headache or confusion.  You have unusual weakness or numbness, or you feel  faint.  You have severe pain in your chest or abdomen.  You vomit repeatedly.  You have trouble breathing. Summary  Hypertension is when the force of blood pumping through your arteries is too strong. If this condition is not controlled, it may put you at risk for serious complications.  Your personal target blood pressure may vary depending on your medical conditions, your age, and other factors. For most people, a normal blood pressure is less than 120/80.  Hypertension is managed by lifestyle changes, medicines, or both. Lifestyle changes include weight loss, eating a healthy, low-sodium diet, exercising more, and limiting alcohol. This information is not intended to replace advice given to you by your health care provider. Make sure you discuss any questions you have with your health care provider. Document Released: 03/27/2012 Document Revised: 10/25/2018 Document Reviewed: 05/31/2016 Elsevier Patient Education  2020 [employer]

## 2019-03-28 NOTE — Assessment & Plan Note (Addendum)
Improved while rested in room. DBP is limiting as well as concern after syncopal episode. Suspect vasovagal syncopal episode likely exacerbated by Imdur as she had had her first dose the day of syncopal episode.   She has been on 10mg  norvasc for 5 days and I can see trend of improvement (with exception of this morning however she does note poor sleep) We jointly agreed that we would give it a few more days prior to escalation of regimen. She will also monitor for leg swelling ( this has occurred in past on 10mg  amlodipine).  Alternatively we discussed trial of HCTZ if swelling problematic and returning patient to 5 mg norvac. She had electrolyte disturbances on HCTZ in the past so I advised that we would need to monitor VERY closely and stay on low dose.  Close follow up.

## 2019-03-31 ENCOUNTER — Other Ambulatory Visit: Payer: Self-pay | Admitting: Family

## 2019-03-31 ENCOUNTER — Telehealth: Payer: Self-pay

## 2019-03-31 DIAGNOSIS — E039 Hypothyroidism, unspecified: Secondary | ICD-10-CM

## 2019-03-31 DIAGNOSIS — I10 Essential (primary) hypertension: Secondary | ICD-10-CM

## 2019-03-31 NOTE — Telephone Encounter (Signed)
Noted May continue regimen

## 2019-03-31 NOTE — Telephone Encounter (Signed)
Patient has been checking blood pressures. She stated that they have been doing well. She had no swelling until today & she said that she thought her feet were slightly swollen.    BP readings:  9/12 126/66 9/13 136/68 9/14 124/68

## 2019-04-03 ENCOUNTER — Telehealth: Payer: Self-pay

## 2019-04-03 NOTE — Telephone Encounter (Signed)
Copied from [place] [id number]. Topic: Appointment Scheduling - Scheduling Inquiry for Clinic >> Apr 03, 2019 11:37 AM Lennox Solders wrote: Reason for CRM:pt is calling to schedule her AWV

## 2019-04-04 ENCOUNTER — Telehealth: Payer: Self-pay | Admitting: *Deleted

## 2019-04-04 DIAGNOSIS — I771 Stricture of artery: Secondary | ICD-10-CM

## 2019-04-04 DIAGNOSIS — I774 Celiac artery compression syndrome: Secondary | ICD-10-CM

## 2019-04-04 DIAGNOSIS — K551 Chronic vascular disorders of intestine: Secondary | ICD-10-CM

## 2019-04-04 NOTE — Progress Notes (Deleted)
Cardiology Office Note    Date:  04/04/2019   ID:  Kayla [name], DOB 03/07/1927, MRN [id number]  PCP:  Burnard Hawthorne, FNP  Cardiologist:  Ida Rogue, MD  Electrophysiologist:  None   Chief Complaint: Follow-up  History of Present Illness:   Kayla [name] is a 85 y.o. female with history of CAD status post three-vessel CABG on 4/94/4967 at [facility], systolic dysfunction, aortic regurgitation, PVCs, carotid artery disease, HTN, HLD, LBBB, hypothyroidism, vertigo, and GERD who presents for follow-up of ***.  Patient previously underwent three-vessel CABG at [facility] on 12/15/1998 with LIMA to LAD, SVG to OM2, and SVG to PDA.  No ischemic evaluations since.  Most recent echo from 10/08/2012 showed an EF of 45 to 50%, mild concentric LVH, septal wall motion abnormality consistent with conduction delay, grade 1 diastolic dysfunction, mild to moderate aortic regurgitation, mild mitral regurgitation, normal RV systolic function, mild to moderate tricuspid regurgitation, PASP 31 mmHg.  She was most recently seen in the office in 05/2018 and reported stable lower extremity swelling with chronic shortness of breath.  Blood pressures at home ranged from the 591M to 384Y systolic.  Stress testing was deferred.  Most recent carotid artery ultrasound showed 40 to 59% right ICA stenosis with 1 to 39% left ICA stenosis with bilateral vertebral arteries demonstrating antegrade flow and normal flow hemodynamics in the bilateral subclavian arteries.  More recently, patient has been followed by PCPs office for elevated BP readings and nonexertional chest pain earlier this month with BP being in the 659D systolic and EKG demonstrating known left bundle.  She was started on Imdur.  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  Following addition of Imdur, patient reported a  syncopal episode.  Patient indicated she was taking a bath and started to feel faint leading her to get out of the bathtub and sit on the toilet where she subsequently fainted.  Notes indicate no head trauma.  She contacted PCPs on-call provider who advised the patient to discontinue Imdur and start amlodipine 10 mg daily.  Upon EMS arrival BP was 135/66.  She was most recently seen by PCP on 03/28/2019 with a BP of 150/60.  In the past, she has had hyponatremia with thiazide diuretic.  Current BP regimen includes: Amlodipine 10 mg, carvedilol 12.5 mg twice daily, clonidine 0.1 mg twice daily, losartan 100 mg.  ***   Labs: 03/2019 -BUN 22, serum creatinine 1.07 potassium 4.1 07/2018 -total cholesterol 114, triglycerides 60, HDL 54, LDL 45, A1c 6.0, albumin 4.0, AST/ALT normal, Hgb 12.6, PLT 148, TSH normal   Past Medical History:  Diagnosis Date   Anginal pain (HCC)    Arthritis    Bundle branch block, left    Chronic   CAD (coronary artery disease)    Cholecystitis    from record   Emphysema lung (HCC)    GERD (gastroesophageal reflux disease)    Heart murmur    HOH (hard of hearing)    Hyperlipidemia    Hypertension    Hypothyroidism    Orthostatic headache    Mild   Pain    HIP AND LEG PAIN USES  CANE   Vertigo     Past Surgical History:  Procedure Laterality Date   APPENDECTOMY  1957   BREAST BIOPSY Left 2011   neg, stereo done by Dr. Jamal Collin  BREAST BIOPSY     CARDIAC CATHETERIZATION  2000   [facility]   CATARACT EXTRACTION Right    right 2004, left 2018    CATARACT EXTRACTION W/PHACO Left 04/12/2017   Procedure: CATARACT EXTRACTION PHACO AND INTRAOCULAR LENS PLACEMENT (IOC);  Surgeon: Lockie Mola, MD;  Location: [facility] ORS;  Service: Ophthalmology;  Laterality: Left;  [place] 01:09.0AP% 23.7CDE 16.35FLUID PACK LOT # [id number] H   CHOLECYSTECTOMY N/A 04/25/2016   Procedure: LAPAROSCOPIC CHOLECYSTECTOMY WITH INTRAOPERATIVE CHOLANGIOGRAM;  Surgeon:  Kieth Brightly, MD;  Location: [facility] ORS;  Service: General;  Laterality: N/A;   CORONARY ANGIOPLASTY     STENT   CORONARY ARTERY BYPASS GRAFT  2001   KNEE SURGERY     thyroid  1970   UPPER GI ENDOSCOPY  2014    Current Medications: No outpatient medications have been marked as taking for the 04/07/19 encounter (Appointment) with Sondra Barges, PA-C.    Allergies:   Bee pollen, Boniva [ibandronic acid], Pollen extract, and Motrin [ibuprofen]   Social History   Socioeconomic History   Marital status: Widowed    Spouse name: Not on file   Number of children: Not on file   Years of education: Not on file   Highest education level: Not on file  Occupational History   Occupation: Part time  Social Network engineer strain: Not on file   Food insecurity    Worry: Not on file    Inability: Not on file   Transportation needs    Medical: Not on file    Non-medical: Not on file  Tobacco Use   Smoking status: Never Smoker   Smokeless tobacco: Never Used  Substance and Sexual Activity   Alcohol use: No   Drug use: No   Sexual activity: Never  Lifestyle   Physical activity    Days per week: Not on file    Minutes per session: Not on file   Stress: Not on file  Relationships   Social connections    Talks on phone: Not on file    Gets together: Not on file    Attends religious service: Not on file    Active member of club or organization: Not on file    Attends meetings of clubs or organizations: Not on file    Relationship status: Not on file  Other Topics Concern   Not on file  Social History Narrative   Widow   Lives with one one her daughters      6 children      Lives in [place]              Family History:  The patient's family history includes Heart attack in her sister; Heart attack (age of onset: 53) in her brother; Heart failure (age of onset: 12) in her sister; Heart failure (age of onset: 58) in her mother.  ROS:    ROS   EKGs/Labs/Other Studies Reviewed:    Studies reviewed were summarized above. The additional studies were reviewed today: As above  EKG:  EKG is ordered today.  The EKG ordered today demonstrates ***  Recent Labs: 07/29/2018: ALT 8; Hemoglobin 12.6; Platelets 148.0; TSH 1.05 03/21/2019: BUN 22; Creat 1.07; Potassium 4.1; Sodium 136  Recent Lipid Panel    Component Value Date/Time   CHOL 114 07/29/2018 1109   TRIG 68.0 07/29/2018 1109   HDL 54.60 07/29/2018 1109   CHOLHDL 2 07/29/2018 1109   VLDL 13.6 07/29/2018 1109   LDLCALC 45 07/29/2018  1109    PHYSICAL EXAM:    VS:  There were no vitals taken for this visit.  BMI: There is no height or weight on file to calculate BMI.  Physical Exam  Wt Readings from Last 3 Encounters:  03/28/19 156 lb 3.2 oz (70.9 kg)  03/21/19 151 lb 12.8 oz (68.9 kg)  03/13/19 153 lb 6.4 oz (69.6 kg)     ASSESSMENT & PLAN:   1. ***  Disposition: F/u with Dr. Mariah MillingGollan or an APP in ***.   Medication Adjustments/Labs and Tests Ordered: Current medicines are reviewed at length with the patient today.  Concerns regarding medicines are outlined above. Medication changes, Labs and Tests ordered today are summarized above and listed in the Patient Instructions accessible in Encounters.   Signed, Eula Listenyan Marytza Grandpre, PA-C 04/04/2019 8:11 AM     [facility] [place] [address] [place], [place] [address] [phone]

## 2019-04-04 NOTE — Telephone Encounter (Signed)
Spoke with patient and reviewed results and recommendations with her. She does have upcoming appointment next week and confirmed this as well as she is approved to bring in one visitor with her due to needing assistance and she is concerned she will forget something. Updated notes to reflect allowing one visitor. Reviewed that I would place referral in for vascular surgery and they should reach out to her and assist with scheduling appointment. She verbalized understanding of results and recommendations, confirmed upcoming appointment here in our office and she had no further questions at this time.

## 2019-04-04 NOTE — Telephone Encounter (Signed)
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

## 2019-04-07 ENCOUNTER — Ambulatory Visit: Payer: Medicare Other | Admitting: Physician Assistant

## 2019-04-08 ENCOUNTER — Other Ambulatory Visit: Payer: Self-pay

## 2019-04-08 ENCOUNTER — Encounter (INDEPENDENT_AMBULATORY_CARE_PROVIDER_SITE_OTHER): Payer: Self-pay

## 2019-04-08 ENCOUNTER — Ambulatory Visit (INDEPENDENT_AMBULATORY_CARE_PROVIDER_SITE_OTHER): Payer: Medicare Other | Admitting: Vascular Surgery

## 2019-04-08 ENCOUNTER — Encounter (INDEPENDENT_AMBULATORY_CARE_PROVIDER_SITE_OTHER): Payer: Self-pay | Admitting: Vascular Surgery

## 2019-04-08 VITALS — BP 185/72 | HR 60 | Resp 16 | Ht 61.0 in | Wt 150.6 lb

## 2019-04-08 DIAGNOSIS — I10 Essential (primary) hypertension: Secondary | ICD-10-CM

## 2019-04-08 DIAGNOSIS — I6523 Occlusion and stenosis of bilateral carotid arteries: Secondary | ICD-10-CM | POA: Diagnosis not present

## 2019-04-08 DIAGNOSIS — E782 Mixed hyperlipidemia: Secondary | ICD-10-CM | POA: Diagnosis not present

## 2019-04-08 DIAGNOSIS — K551 Chronic vascular disorders of intestine: Secondary | ICD-10-CM

## 2019-04-08 DIAGNOSIS — I771 Stricture of artery: Secondary | ICD-10-CM

## 2019-04-08 NOTE — Assessment & Plan Note (Signed)
40 to 59% range on the right, 1 to 39% range on the left.  Could be checked annually.  Unlikely to be the cause of her syncope

## 2019-04-08 NOTE — Patient Instructions (Signed)
Chronic Mesenteric Ischemia  Chronic mesenteric ischemia is poor blood flow (circulation) in the vessels that supply blood to the stomach, intestines, and liver (mesenteric organs). When the blood supply is severely restricted, these organs cannot work properly. This condition is also called mesenteric angina, or intestinal angina. This condition is a long-term (chronic) condition. It happens when an artery or vein that provides blood to the mesenteric organs gradually becomes blocked or narrows over time, restricting the blood supply to these organs. What are the causes? This condition is commonly caused by fatty deposits that build up in an artery (plaque), which can narrow the artery and restrict blood flow. Other causes include:  Weakened areas in blood vessel walls (aneurysms).  Conditions that cause twisting or inflammation of blood vessels, such as fibromuscular dysplasia or arteritis.  A disorder in which blood clots form in the veins (venous thrombosis).  Scarring and thickening (fibrosis) of blood vessels caused by radiation therapy.  A tear in the aorta, the body's main artery (aortic dissection).  Blood vessel problems after illegal drug use, such as use of cocaine.  Tumors in the nervous system (neurofibromatosis).  Certain autoimmune diseases, such as lupus. What increases the risk? The following factors may make you more likely to develop this condition:  Being female.  Being over age 50, especially if you have a history of heart problems.  Smoking.  Having congestive heart failure.  Having an irregular heartbeat (arrhythmia).  Having a history of heart attack or stroke.  Having diabetes.  Having high cholesterol.  Having high blood pressure (hypertension).  Being overweight or obese.  Having kidney disease (renal disease) that requires dialysis. What are the signs or symptoms? Symptoms of this condition include:  Pain or cramps in the abdomen that  develop 15-60 minutes after a meal. This pain may last for 1-3 hours. Some people may develop a fear of eating because of this symptom.  Weight loss.  Diarrhea.  Bloody stool.  Nausea.  Vomiting.  Bloating.  Abdominal pain after stress or with exercise. How is this diagnosed? This condition is diagnosed based on:  Your medical history.  A physical exam.  Tests, such as: ? Ultrasound. ? CT scan. ? Blood tests. ? Urine tests. ? An imaging test that involves injecting a dye into your arteries to show blood flow through blood vessels (angiogram). This can help to show if there are any blockages in the vessels that lead to the intestines. ? Passing a small probe through the mouth and into the stomach to measure the output of carbon dioxide (gastric tonometry). This can help to indicate whether there is decreased blood flow to the stomach and intestines. How is this treated? This condition may be treated with:  Dietary changes such as eating smaller, low-fat, meals more frequently.  Lifestyle changes to treat underlying conditions that contribute to the disease, such as high cholesterol and high blood pressure.  Medicines to reduce blood clotting and increase blood flow.  Surgery to remove the blockage, repair arteries or veins, and restore blood flow. This may involve: ? Angioplasty. This is surgery to widen the affected artery, reduce the blockage, and sometimes insert a small, mesh tube (stent). ? Bypass surgery. This may be done to go around (bypass) the blockage and reconnect healthy arteries or veins. ? Placing a stent in the affected area. This may be done to help keep blocked arteries open. Follow these instructions at home: Eating and drinking   Eat a heart-healthy diet. This   includes fresh fruits and vegetables, whole grains, and lean proteins like chicken, fish, and beans.  Avoid foods that contain a lot of: ? Salt (sodium). ? Sugar. ? Saturated fat (such as  red meat). ? Trans fat (such as in fried foods).  Stay hydrated. Drink enough fluid to keep your urine pale yellow. Lifestyle  Stay active and get regular exercise as told by your health care provider. Aim for 150 minutes of moderate activity or 75 minutes of vigorous activity a week. Ask your health care provider what activities and forms of exercise are safe for you.  Maintain a healthy weight.  Work with your health care provider to manage your cholesterol.  Manage any other health problems you have, such as high blood pressure, diabetes, or heart rhythm problems.  Do not use any products that contain nicotine or tobacco, such as cigarettes, e-cigarettes, and chewing tobacco. If you need help quitting, ask your health care provider. General instructions  Take over-the-counter and prescription medicines only as told by your health care provider.  Keep all follow-up visits as told by your health care provider. This is important.  You may need to take actions to prevent or treat constipation, such as: ? Drink enough fluid to keep your urine pale yellow. ? Take over-the-counter or prescription medicines. ? Eat foods that are high in fiber, such as beans, whole grains, and fresh fruits and vegetables. ? Limit foods that are high in fat and processed sugars, such as fried or sweet foods. Contact a health care provider if:  Your symptoms do not improve or they return after treatment.  You have a fever.  You are constipated. Get help right away if you:  Have severe abdominal pain.  Have severe chest pain.  Have shortness of breath.  Feel weak or dizzy.  Have fast or irregular heartbeats (palpitations).  Have numbness or weakness in your face, arm, or leg.  Are confused.  Have trouble speaking or people have trouble understanding what you are saying.  Have trouble urinating.  Have blood in your stool.  Have severe nausea, vomiting, or persistent diarrhea. These  symptoms may represent a serious problem that is an emergency. Do not wait to see if the symptoms will go away. Get medical help right away. Call your local emergency services (911 in the [place] Do not drive yourself to the hospital. Summary  Mesenteric ischemia is poor circulation in the vessels that supply blood to the the stomach, intestines, and liver (mesenteric organs).  This condition happens when an artery or vein that provides blood to the mesenteric organs gradually becomes blocked or narrow, restricting the blood supply to the organs.  This condition is commonly caused by fatty deposits that build up in an artery (plaque), which can narrow the artery and restrict blood flow.  You are more likely to develop this condition if you are over age 50 and have a history of heart problems, high blood pressure, diabetes, or high cholesterol.  This condition is usually treated with medicines, dietary and lifestyle changes, and surgery to remove the blockage, repair arteries or veins, and restore blood flow. This information is not intended to replace advice given to you by your health care provider. Make sure you discuss any questions you have with your health care provider. Document Released: 02/20/2011 Document Revised: 03/08/2018 Document Reviewed: 03/08/2018 Elsevier Patient Education  2020 [employer]  

## 2019-04-08 NOTE — Assessment & Plan Note (Signed)
blood pressure control important in reducing the progression of atherosclerotic disease. On appropriate oral medications.  

## 2019-04-08 NOTE — Assessment & Plan Note (Signed)
Her renal artery flow was normal but incidentally elevated velocities were seen in the celiac and SMA.  I went back and reviewed a CT scan from 2017 which showed some significant calcifications of the celiac and SMA although high-grade stenosis did not appear to be present.   At this point, the patient really does not have any symptoms of chronic mesenteric ischemia.  Even if she has some disease in the celiac and SMA without symptoms, particularly in an 85 year old, I would not recommend any immediate intervention.  I will plan a formal mesenteric duplex to be done in the next month or 2 at her convenience.  I have asked her to monitor for symptoms of chronic visceral ischemia.  Specifically, this would be food fear, food intolerance, postprandial abdominal pain or nausea or vomiting, or unintentional weight loss.

## 2019-04-08 NOTE — Assessment & Plan Note (Signed)
lipid control important in reducing the progression of atherosclerotic disease. Continue statin therapy  

## 2019-04-08 NOTE — Progress Notes (Signed)
Patient ID: Kayla [name], female   DOB: 11/11/1926, 85 y.o.   MRN: [id number]  Chief Complaint  Patient presents with  . 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 female.  I am asked to see the patient by Dr. Mariah MillingGollan for evaluation of celiac and mesenteric artery stenosis seen incidentally on a recent renal artery duplex.  She also had her carotids evaluated earlier this year when she had a syncopal episode but these were basically unrevealing with only mild to moderate disease on the right and mild disease on the left.  She denies any significant postprandial abdominal pain, food fear, or unintentional weight loss.  She has occasional constipation but no significant abdominal symptoms to speak of.  Her renal artery flow was normal but incidentally elevated velocities were seen in the celiac and SMA.  I went back and reviewed a CT scan from 2017 which showed some significant calcifications of the celiac and SMA although high-grade stenosis did not appear to be present.     Past Medical History:  Diagnosis Date  . Anginal pain (HCC)   . Arthritis   . Bundle branch block, left    Chronic  . CAD (coronary artery disease)   . Cholecystitis    from record  . Emphysema lung (HCC)   . GERD (gastroesophageal reflux disease)   . Heart murmur   . HOH (hard of hearing)   . Hyperlipidemia   . Hypertension   . Hypothyroidism   . Orthostatic headache    Mild  . Pain    HIP AND LEG PAIN USES  CANE  . Vertigo     Past Surgical History:  Procedure Laterality Date  . APPENDECTOMY  1957  . BREAST BIOPSY Left 2011   neg, stereo done by Dr. Evette CristalSankar  . BREAST BIOPSY    . CARDIAC CATHETERIZATION  2000   [facility]  . CATARACT EXTRACTION Right    right 2004, left 2018   . CATARACT EXTRACTION W/PHACO Left 04/12/2017   Procedure: CATARACT EXTRACTION PHACO AND INTRAOCULAR LENS PLACEMENT (IOC);   Surgeon: Lockie MolaBrasington, Chadwick, MD;  Location: [facility] ORS;  Service: Ophthalmology;  Laterality: Left;  US 01:09.0AP% 23.7CDE 16.35FLUID PACK LOT # [id number]  . CHOLECYSTECTOMY N/A 04/25/2016   Procedure: LAPAROSCOPIC CHOLECYSTECTOMY WITH INTRAOPERATIVE CHOLANGIOGRAM;  Surgeon: Kieth BrightlySeeplaputhur G Sankar, MD;  Location: [facility] ORS;  Service: General;  Laterality: N/A;  . CORONARY ANGIOPLASTY     STENT  . CORONARY ARTERY BYPASS GRAFT  2001  . KNEE SURGERY    . thyroid  1970  . UPPER GI ENDOSCOPY  2014    Family History Family History  Problem Relation Age of Onset  . Heart failure Mother 8876  . Heart failure Sister 2648  . Heart attack Sister   . Heart attack Brother 42  No bleeding or clotting disorders  Social History Social History   Tobacco Use  . Smoking status: Never Smoker  . Smokeless tobacco: Never Used  Substance Use Topics  . Alcohol use: No  . Drug use: No    Allergies  Allergen Reactions  . Bee Pollen     Other reaction(s): Other (See Comments)  . Boniva [Ibandronic Acid]     Jaw pain  . Pollen Extract   . Motrin [Ibuprofen] Rash    Current Outpatient Medications  Medication Sig Dispense Refill  . amLODipine (NORVASC)  10 MG tablet TAKE 1 TABLET BY MOUTH  DAILY 90 tablet 3  . aspirin 81 MG tablet Take 81 mg by mouth daily.      . carvedilol (COREG) 12.5 MG tablet Take 1 tablet (12.5 mg total) by mouth 2 (two) times daily. 60 tablet 0  . cloNIDine (CATAPRES) 0.1 MG tablet TAKE 1 TABLET BY MOUTH TWO  TIMES DAILY 180 tablet 0  . Cyanocobalamin (VITAMIN B 12 PO) Take 3,000 Units by mouth daily.    . famotidine (PEPCID) 20 MG tablet TAKE 1 TABLET BY MOUTH  TWICE DAILY 180 tablet 3  . fluticasone (FLONASE) 50 MCG/ACT nasal spray Place 2 sprays into both nostrils daily. 16 g 3  . levothyroxine (SYNTHROID) 100 MCG tablet TAKE 1 TABLET BY MOUTH  DAILY BEFORE BREAKFAST 90 tablet 3  . losartan (COZAAR) 100 MG tablet TAKE 1 TABLET BY MOUTH  DAILY 90 tablet 3  . nitroGLYCERIN  (NITROSTAT) 0.4 MG SL tablet Place 1 tablet (0.4 mg total) under the tongue every 5 (five) minutes as needed for chest pain. 25 tablet 1  . pravastatin (PRAVACHOL) 20 MG tablet TAKE 1 TABLET BY MOUTH AT  BEDTIME 90 tablet 1   No current facility-administered medications for this visit.       REVIEW OF SYSTEMS (Negative unless checked)  Constitutional: [] Weight loss  [] Fever  [] Chills Cardiac: [] Chest pain   [] Chest pressure   [] Palpitations   [] Shortness of breath when laying flat   [] Shortness of breath at rest   [x] Shortness of breath with exertion. Vascular:  [] Pain in legs with walking   [] Pain in legs at rest   [] Pain in legs when laying flat   [] Claudication   [] Pain in feet when walking  [] Pain in feet at rest  [] Pain in feet when laying flat   [] History of DVT   [] Phlebitis   [] Swelling in legs   [] Varicose veins   [] Non-healing ulcers Pulmonary:   [] Uses home oxygen   [] Productive cough   [] Hemoptysis   [] Wheeze  [x] COPD   [] Asthma Neurologic:  [] Dizziness  [x] Blackouts   [] Seizures   [] History of stroke   [] History of TIA  [] Aphasia   [] Temporary blindness   [] Dysphagia   [] Weakness or numbness in arms   [] Weakness or numbness in legs Musculoskeletal:  [x] Arthritis   [] Joint swelling   [x] Joint pain   [] Low back pain Hematologic:  [] Easy bruising  [] Easy bleeding   [] Hypercoagulable state   [] Anemic  [] Hepatitis Gastrointestinal:  [] Blood in stool   [] Vomiting blood  [x] Gastroesophageal reflux/heartburn   [] Abdominal pain Genitourinary:  [] Chronic kidney disease   [] Difficult urination  [] Frequent urination  [] Burning with urination   [] Hematuria Skin:  [] Rashes   [] Ulcers   [] Wounds Psychological:  [] History of anxiety   []  History of major depression.    Physical Exam BP (!) 185/72 (BP Location: Right Arm)   Pulse 60   Resp 16   Ht 5\' 1"  (1.549 m)   Wt 150 lb 9.6 oz (68.3 kg)   BMI 28.46 kg/m  Gen:  WD/WN, NAD.  Appears younger than stated age Head: [place]/AT, No temporalis  wasting.  Ear/Nose/Throat: Hearing diminished, nares w/o erythema or drainage, oropharynx w/o Erythema/Exudate Eyes: Conjunctiva clear, sclera non-icteric  Neck: trachea midline.  No JVD.  No bruit present Pulmonary:  Good air movement, respirations not labored, no use of accessory muscles  Cardiac: RRR, no JVD Vascular:  Vessel Right Left  Radial Palpable Palpable  Gastrointestinal:. No masses, surgical incisions, or scars.  Normal active bowel sounds.  Nontender. Musculoskeletal: M/S 5/5 throughout.  Extremities without ischemic changes.  No deformity or atrophy.  Trace lower extremity edema. Neurologic: Sensation grossly intact in extremities.  Symmetrical.  Speech is fluent. Motor exam as listed above. Psychiatric: Judgment intact, Mood & affect appropriate for pt's clinical situation. Dermatologic: No rashes or ulcers noted.  No cellulitis or open wounds.    Radiology Vas US Renal Artery Duplex  Result Date: 03/26/2019 ABDOMINAL VISCERAL Indications: Hypertension High Risk Factors: Coronary artery disease. Other Factors: Carotid artery disease                 Patient denied abdominal and back pain. Comparison Study: None Performing Technologist: Quentin Ore RDMS, RVT, RDCS  Examination Guidelines: A complete evaluation includes B-mode imaging, spectral Doppler, color Doppler, and power Doppler as needed of all accessible portions of each vessel. Bilateral testing is considered an integral part of a complete examination. Limited examinations for reoccurring indications may be performed as noted.  Duplex Findings: +--------------------+--------+--------+------+--------+ Mesenteric          PSV cm/sEDV cm/sPlaqueComments +--------------------+--------+--------+------+--------+ Aorta at SMA          111                          +--------------------+--------+--------+------+--------+ Aorta Mid             171                           +--------------------+--------+--------+------+--------+ Aorta Distal          168                          +--------------------+--------+--------+------+--------+ Celiac Artery Origin  283      33                  +--------------------+--------+--------+------+--------+ SMA Proximal          330      33                  +--------------------+--------+--------+------+--------+ IMA                   171                          +--------------------+--------+--------+------+--------+    +------------------+--------+--------+-------+ Right Renal ArteryPSV cm/sEDV cm/sComment +------------------+--------+--------+-------+ Origin              143      17           +------------------+--------+--------+-------+ Proximal            170      17           +------------------+--------+--------+-------+ Mid                  70      7            +------------------+--------+--------+-------+ Distal               38      6            +------------------+--------+--------+-------+ +-----------------+--------+--------+-------+ Left Renal ArteryPSV cm/sEDV cm/sComment +-----------------+--------+--------+-------+ Proximal           110      17           +-----------------+--------+--------+-------+  Mid                 53      7            +-----------------+--------+--------+-------+ Distal              90      15           +-----------------+--------+--------+-------+  Technologist observations Renal Artery(s):The renal arteries were not able to be identified from the anterior view +------------+--------+--------+----+-----------+--------+--------+----+ Right KidneyPSV cm/sEDV cm/sRI  Left KidneyPSV cm/sEDV cm/sRI   +------------+--------+--------+----+-----------+--------+--------+----+ Upper Pole  21      4       0.82Upper Pole 15      3       0.78 +------------+--------+--------+----+-----------+--------+--------+----+ Mid         20       4       0.79Mid        15      3       0.77 +------------+--------+--------+----+-----------+--------+--------+----+ Lower Pole  19      3       0.83Lower Pole 14      4       0.72 +------------+--------+--------+----+-----------+--------+--------+----+ Hilar       58      8       0.87Hilar      35      5       0.84 +------------+--------+--------+----+-----------+--------+--------+----+ +------------------+--------+------------------+--------+ Right Kidney              Left Kidney                +------------------+--------+------------------+--------+ RAR                       RAR                        +------------------+--------+------------------+--------+ RAR (manual)              RAR (manual)               +------------------+--------+------------------+--------+ Cortex                    Cortex                     +------------------+--------+------------------+--------+ Cortex thickness  11.00 mmCorex thickness   10.00 mm +------------------+--------+------------------+--------+ Kidney length (cm)9.50    Kidney length (cm)9.78     +------------------+--------+------------------+--------+  Summary: Renal:  Right: Normal size right kidney. Abnormal right Resistive Index.        Normal cortical thickness of right kidney. No evidence of        right renal artery stenosis. RRV flow present. Left:  LRV flow present. No evidence of left renal artery stenosis.        Normal size of left kidney. Abnormal left Resisitve Index.        Normal cortical thickness of the left kidney. Mesenteric: 70 to 99% stenosis in the celiac artery and superior mesenteric artery.  *See table(s) above for measurements and observations.  Diagnosing physician: Quay Burow MD  Electronically signed by Quay Burow MD on 03/26/2019 at 1:50:26 PM.    Final     Labs Recent Results (from the past 2160 hour(s))  Basic metabolic panel     Status: Abnormal   Collection Time:  03/21/19  4:07 PM  Result Value Ref Range   Glucose, Bld  97 65 - 99 mg/dL    Comment: .            Fasting reference interval .    BUN 22 7 - 25 mg/dL   Creat 9.38 (H) 1.01 - 0.88 mg/dL    Comment: For patients >27 years of age, the reference limit for Creatinine is approximately 13% higher for people identified as African-American. .    BUN/Creatinine Ratio 21 6 - 22 (calc)   Sodium 136 135 - 146 mmol/L   Potassium 4.1 3.5 - 5.3 mmol/L   Chloride 100 98 - 110 mmol/L   CO2 23 20 - 32 mmol/L   Calcium 9.6 8.6 - 10.4 mg/dL    Assessment/Plan:  Essential hypertension blood pressure control important in reducing the progression of atherosclerotic disease. On appropriate oral medications.   Mixed hyperlipidemia lipid control important in reducing the progression of atherosclerotic disease. Continue statin therapy   Carotid stenosis 40 to 59% range on the right, 1 to 39% range on the left.  Could be checked annually.  Unlikely to be the cause of her syncope  SMA stenosis (HCC) Her renal artery flow was normal but incidentally elevated velocities were seen in the celiac and SMA.  I went back and reviewed a CT scan from 2017 which showed some significant calcifications of the celiac and SMA although high-grade stenosis did not appear to be present.   At this point, the patient really does not have any symptoms of chronic mesenteric ischemia.  Even if she has some disease in the celiac and SMA without symptoms, particularly in an 85 year old, I would not recommend any immediate intervention.  I will plan a formal mesenteric duplex to be done in the next month or 2 at her convenience.  I have asked her to monitor for symptoms of chronic visceral ischemia.  Specifically, this would be food fear, food intolerance, postprandial abdominal pain or nausea or vomiting, or unintentional weight loss.      Festus Barren 04/08/2019, 3:04 PM   This note was created with Dragon medical  transcription system.  Any errors from dictation are unintentional.

## 2019-04-10 ENCOUNTER — Emergency Department: Payer: Medicare Other

## 2019-04-10 ENCOUNTER — Emergency Department
Admission: EM | Admit: 2019-04-10 | Discharge: 2019-04-11 | Disposition: A | Discharge status: Home / Self Care | Payer: Medicare Other | Attending: Emergency Medicine | Admitting: Emergency Medicine

## 2019-04-10 ENCOUNTER — Encounter: Payer: Self-pay | Admitting: Emergency Medicine

## 2019-04-10 ENCOUNTER — Other Ambulatory Visit: Payer: Self-pay

## 2019-04-10 DIAGNOSIS — Z7982 Long term (current) use of aspirin: Secondary | ICD-10-CM | POA: Diagnosis not present

## 2019-04-10 DIAGNOSIS — Z955 Presence of coronary angioplasty implant and graft: Secondary | ICD-10-CM | POA: Insufficient documentation

## 2019-04-10 DIAGNOSIS — E039 Hypothyroidism, unspecified: Secondary | ICD-10-CM | POA: Diagnosis not present

## 2019-04-10 DIAGNOSIS — R202 Paresthesia of skin: Secondary | ICD-10-CM | POA: Insufficient documentation

## 2019-04-10 DIAGNOSIS — Z79899 Other long term (current) drug therapy: Secondary | ICD-10-CM | POA: Insufficient documentation

## 2019-04-10 DIAGNOSIS — I2510 Atherosclerotic heart disease of native coronary artery without angina pectoris: Secondary | ICD-10-CM | POA: Insufficient documentation

## 2019-04-10 DIAGNOSIS — N189 Chronic kidney disease, unspecified: Secondary | ICD-10-CM | POA: Diagnosis not present

## 2019-04-10 DIAGNOSIS — Z951 Presence of aortocoronary bypass graft: Secondary | ICD-10-CM | POA: Diagnosis not present

## 2019-04-10 DIAGNOSIS — I129 Hypertensive chronic kidney disease with stage 1 through stage 4 chronic kidney disease, or unspecified chronic kidney disease: Secondary | ICD-10-CM | POA: Diagnosis not present

## 2019-04-10 LAB — COMPREHENSIVE METABOLIC PANEL
ALT: 12 U/L (ref 0–44)
AST: 15 U/L (ref 15–41)
Albumin: 4.2 g/dL (ref 3.5–5.0)
Alkaline Phosphatase: 52 U/L (ref 38–126)
Anion gap: 13 (ref 5–15)
BUN: 24 mg/dL — ABNORMAL HIGH (ref 8–23)
CO2: 21 mmol/L — ABNORMAL LOW (ref 22–32)
Calcium: 8.9 mg/dL (ref 8.9–10.3)
Chloride: 99 mmol/L (ref 98–111)
Creatinine, Ser: 1.13 mg/dL — ABNORMAL HIGH (ref 0.44–1.00)
GFR calc Af Amer: 50 mL/min — ABNORMAL LOW (ref >60)
GFR calc non Af Amer: 43 mL/min — ABNORMAL LOW (ref >60)
Glucose, Bld: 165 mg/dL — ABNORMAL HIGH (ref 70–99)
Potassium: 4.5 mmol/L (ref 3.5–5.1)
Sodium: 133 mmol/L — ABNORMAL LOW (ref 135–145)
Total Bilirubin: 0.8 mg/dL (ref 0.3–1.2)
Total Protein: 6.7 g/dL (ref 6.5–8.1)

## 2019-04-10 LAB — PROTIME-INR
INR: 1 (ref 0.8–1.2)
Prothrombin Time: 13 seconds (ref 11.4–15.2)

## 2019-04-10 LAB — DIFFERENTIAL
Abs Immature Granulocytes: 0.03 10*3/uL (ref 0.00–0.07)
Basophils Absolute: 0.1 10*3/uL (ref 0.0–0.1)
Basophils Relative: 1 %
Eosinophils Absolute: 0.4 10*3/uL (ref 0.0–0.5)
Eosinophils Relative: 6 %
Immature Granulocytes: 0 %
Lymphocytes Relative: 28 %
Lymphs Abs: 2.1 10*3/uL (ref 0.7–4.0)
Monocytes Absolute: 0.7 10*3/uL (ref 0.1–1.0)
Monocytes Relative: 9 %
Neutro Abs: 4.2 10*3/uL (ref 1.7–7.7)
Neutrophils Relative %: 56 %

## 2019-04-10 LAB — CBC
HCT: 38.4 % (ref 36.0–46.0)
Hemoglobin: 12.9 g/dL (ref 12.0–15.0)
MCH: 30.8 pg (ref 26.0–34.0)
MCHC: 33.6 g/dL (ref 30.0–36.0)
MCV: 91.6 fL (ref 80.0–100.0)
Platelets: 168 10*3/uL (ref 150–400)
RBC: 4.19 MIL/uL (ref 3.87–5.11)
RDW: 13.8 % (ref 11.5–15.5)
WBC: 7.5 10*3/uL (ref 4.0–10.5)
nRBC: 0 % (ref 0.0–0.2)

## 2019-04-10 LAB — APTT: aPTT: 30 seconds (ref 24–36)

## 2019-04-10 LAB — GLUCOSE, CAPILLARY: Glucose-Capillary: 157 mg/dL — ABNORMAL HIGH (ref 70–99)

## 2019-04-10 NOTE — ED Notes (Signed)
Pt back in room 03 from MRI, daughter at the bedside

## 2019-04-10 NOTE — ED Triage Notes (Signed)
Pt arrives from home via [facility] stating that she woke up this morning with numbness in her left arm and leg. States that she thought that she slept on it however the numbness hasn't subsided throughout the day.

## 2019-04-10 NOTE — ED Notes (Signed)
Pt in afib on monitor. Denies any hx of same.

## 2019-04-10 NOTE — ED Notes (Signed)
Patient transported to MRI 

## 2019-04-10 NOTE — ED Provider Notes (Signed)
[facility] Emergency Department Provider Note   ____________________________________________   First MD Initiated Contact with Patient 04/10/19 2048     (approximate)  I have reviewed the triage vital signs and the nursing notes.   HISTORY  Chief Complaint Weakness and Numbness    HPI Kayla [name] is a 85 y.o. female history of coronary disease, left bundle branch block she reports 2 previous very mild strokes but she was told about by her doctor   Patient reports she felt fine went to bed last night.  Sometime around 7:30 PM.  She woke up during the middle of the night no she felt like a little tingling in the left forearm also some the left lower leg.  This persisted throughout the day.  Went to her eye doctor today, had examination.  Reports she does continue to feel like a tingling feeling oral slight loss of sensation in the left arm but no weakness and she reports it is very minimal.  No trouble walking.  No trouble speaking.  No headaches.  Denies neck pain  Past Medical History:  Diagnosis Date  . Anginal pain (HCC)   . Arthritis   . Bundle branch block, left    Chronic  . CAD (coronary artery disease)   . Cholecystitis    from record  . Emphysema lung (HCC)   . GERD (gastroesophageal reflux disease)   . Heart murmur   . HOH (hard of hearing)   . Hyperlipidemia   . Hypertension   . Hypothyroidism   . Orthostatic headache    Mild  . Pain    HIP AND LEG PAIN USES  CANE  . Vertigo     Patient Active Problem List   Diagnosis Date Noted  . 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 Chest pain 03/21/2019  . Arthralgia 08/26/2018  . Fatigue 07/29/2018  . Osteopenia 04/03/2018  . Neuropathy 04/03/2018  . Urinary incontinence 04/03/2018  . Hematuria 12/24/2017  . Skin lesion 12/24/2017  . Impacted cerumen of left ear 12/24/2017  . Allergic rhinitis 12/24/2017  . Osteoporosis 08/27/2017  . Hyponatremia 09/21/2016  . Chronic pain of  right knee 08/09/2016  . Screening for breast cancer 08/09/2016  . Carotid stenosis 08/08/2016  . Chronic kidney disease 08/08/2016  . Hypothyroidism 08/08/2016  . Bursitis of hip 03/07/2016  . Bilateral leg edema 10/11/2015  . Aortic valve stenosis 04/12/2015  . Essential hypertension 01/09/2013  . Mixed hyperlipidemia 07/07/2011  . GERD (gastroesophageal reflux disease) 06/10/2010  . CAD, AUTOLOGOUS BYPASS GRAFT 04/15/2009  . LBBB (left bundle branch block) 04/15/2009    Past Surgical History:  Procedure Laterality Date  . APPENDECTOMY  1957  . BREAST BIOPSY Left 2011   neg, stereo done by Dr. Evette Cristal  . BREAST BIOPSY    . CARDIAC CATHETERIZATION  2000   [facility]  . CATARACT EXTRACTION Right    right 2004, left 2018   . CATARACT EXTRACTION W/PHACO Left 04/12/2017   Procedure: CATARACT EXTRACTION PHACO AND INTRAOCULAR LENS PLACEMENT (IOC);  Surgeon: Lockie Mola, MD;  Location: [facility] ORS;  Service: Ophthalmology;  Laterality: Left;  [place] 01:09.0AP% 23.7CDE 16.35FLUID PACK LOT # [id number]  . CHOLECYSTECTOMY N/A 04/25/2016   Procedure: LAPAROSCOPIC CHOLECYSTECTOMY WITH INTRAOPERATIVE CHOLANGIOGRAM;  Surgeon: Kieth Brightly, MD;  Location: [facility] ORS;  Service: General;  Laterality: N/A;  . CORONARY ANGIOPLASTY     STENT  . CORONARY ARTERY BYPASS GRAFT  2001  . KNEE SURGERY    . thyroid  [place] [facility]  2014    Prior to Admission medications   Medication Sig Start Date End Date Taking? Authorizing Provider  amLODipine (NORVASC) 10 MG tablet TAKE 1 TABLET BY MOUTH  DAILY 04/02/19   Burnard Hawthorne, FNP  aspirin 81 MG tablet Take 81 mg by mouth daily.      [provider]  carvedilol (COREG) 12.5 MG tablet Take 1 tablet (12.5 mg total) by mouth 2 (two) times daily. 03/13/19   Jodelle Green, FNP  cloNIDine (CATAPRES) 0.1 MG tablet TAKE 1 TABLET BY MOUTH TWO  TIMES DAILY 02/27/19   Minna Merritts, MD  Cyanocobalamin (VITAMIN B 12 PO) Take 3,000 Units  by mouth daily.    [provider]  famotidine (PEPCID) 20 MG tablet TAKE 1 TABLET BY MOUTH  TWICE DAILY 04/02/19   Burnard Hawthorne, FNP  fluticasone (FLONASE) 50 MCG/ACT nasal spray Place 2 sprays into both nostrils daily. 12/24/17   Burnard Hawthorne, FNP  levothyroxine (SYNTHROID) 100 MCG tablet TAKE 1 TABLET BY MOUTH  DAILY BEFORE BREAKFAST 04/02/19   Burnard Hawthorne, FNP  losartan (COZAAR) 100 MG tablet TAKE 1 TABLET BY MOUTH  DAILY 02/27/19   Burnard Hawthorne, FNP  nitroGLYCERIN (NITROSTAT) 0.4 MG SL tablet Place 1 tablet (0.4 mg total) under the tongue every 5 (five) minutes as needed for chest pain. 03/21/19   Burnard Hawthorne, FNP  pravastatin (PRAVACHOL) 20 MG tablet TAKE 1 TABLET BY MOUTH AT  BEDTIME 11/25/18   Burnard Hawthorne, FNP    Allergies Bee pollen, Boniva [ibandronic acid], Pollen extract, and Motrin [ibuprofen]  Family History  Problem Relation Age of Onset  . Heart failure Mother 74  . Heart failure Sister 69  . Heart attack Sister   . Heart attack Brother 58    Social History Social History   Tobacco Use  . Smoking status: Never Smoker  . Smokeless tobacco: Never Used  Substance Use Topics  . Alcohol use: No  . Drug use: No    Review of Systems Constitutional: No fever/chills Eyes: No visual changes. ENT: No sore throat. Cardiovascular: Denies chest pain. Respiratory: Denies shortness of breath. Gastrointestinal: No abdominal pain.   Genitourinary: Negative for dysuria. Musculoskeletal: Negative for back pain. Skin: Negative for rash. Neurological: Negative for headaches, areas of focal weakness or numbness except some tingling in left arm lower and left lower leg since sometime during the middle the night.    ____________________________________________   PHYSICAL EXAM:  VITAL SIGNS: ED Triage Vitals  Enc Vitals Group     BP 04/10/19 2002 (!) 167/68     Pulse Rate 04/10/19 2002 76     Resp 04/10/19 2030 15     Temp  04/10/19 2002 98.3 F (36.8 C)     Temp Source 04/10/19 2002 Oral     SpO2 04/10/19 2002 95 %     Weight 04/10/19 2003 152 lb (68.9 kg)     Height 04/10/19 2003 5\' 1"  (1.549 m)     Head Circumference --      Peak Flow --      Pain Score 04/10/19 2003 0     Pain Loc --      Pain Edu? --      Excl. in [place]? --     Constitutional: Alert and oriented. Well appearing and in no acute distress. Eyes: Conjunctivae are normal. Head: Atraumatic. Nose: No congestion/rhinnorhea. Mouth/Throat: Mucous membranes are moist. Neck:  No stridor.  Cardiovascular: Normal rate, regular rhythm. Grossly normal heart sounds.  Good peripheral circulation. Respiratory: Normal respiratory effort.  No retractions. Lungs CTAB. Gastrointestinal: Soft and nontender. No distention. Musculoskeletal: No lower extremity tenderness nor edema. Neurologic:  Normal speech and language. No gross focal neurologic deficits are appreciated.  No pronator drift.  Full range of motion all extremities with normal strength except some limitation in the right lower leg which she reports is chronic due to having a plate or previous surgery there nothing new.  On objective testing she can discriminate light touch and sensation equally over all extremities including hands feet legs and forearms as well as the face.  Cranial nerve exam is normal.  Extraocular movements are normal.  Visual fields are normal.  No ataxia. Skin:  Skin is warm, dry and intact. No rash noted. Psychiatric: Mood and affect are normal. Speech and behavior are normal.  ____________________________________________   LABS (all labs ordered are listed, but only abnormal results are displayed)  Labs Reviewed  GLUCOSE, CAPILLARY - Abnormal; Notable for the following components:      Result Value   Glucose-Capillary 157 (*)    All other components within normal limits  COMPREHENSIVE METABOLIC PANEL - Abnormal; Notable for the following components:   Sodium 133 (*)     CO2 21 (*)    Glucose, Bld 165 (*)    BUN 24 (*)    Creatinine, Ser 1.13 (*)    GFR calc non Af Amer 43 (*)    GFR calc Af Amer 50 (*)    All other components within normal limits  DIFFERENTIAL  CBC  APTT  PROTIME-INR  CBG MONITORING, ED   ____________________________________________  EKG  Reviewed entered by me at 2345 Heart rate 70 QRS 159 QTc 500 Sinus rhythm, left bundle branch block.  History of left bundle ____________________________________________  RADIOLOGY  Ct Head Wo Contrast  Result Date: 04/10/2019 CLINICAL DATA:  85 year old female with numbness in the left arm. EXAM: CT HEAD WITHOUT CONTRAST TECHNIQUE: Contiguous axial images were obtained from the base of the skull through the vertex without intravenous contrast. COMPARISON:  None. FINDINGS: Brain: There is mild age-related atrophy and chronic microvascular ischemic changes. Small old left basal ganglia lacunar infarct. There is no acute intracranial hemorrhage. No mass effect or midline shift. No extra-axial fluid collection. Vascular: No hyperdense vessel or unexpected calcification. Skull: Normal. Negative for fracture or focal lesion. Sinuses/Orbits: There is diffuse mucoperiosteal thickening of paranasal sinuses. No air-fluid level. The mastoid air cells are clear. Other: None IMPRESSION: 1. No acute intracranial hemorrhage. 2. Mild age-related atrophy and chronic microvascular ischemic changes. Small old left basal ganglia lacunar infarct. Electronically Signed   By: Elgie Collard M.D.   On: 04/10/2019 20:49   Mr Brain Wo Contrast  Result Date: 04/10/2019 CLINICAL DATA:  Left arm and leg tingling since last night, exclude stroke; numbness or tingling, paresthesia. Additional history provided: Patient woke this morning with numbness in left arm and leg. EXAM: MRI HEAD WITHOUT CONTRAST TECHNIQUE: Multiplanar, multiecho pulse sequences of the brain and surrounding structures were obtained without intravenous  contrast. COMPARISON:  Head CT 04/10/2019, brain MRI 11/12/2014 FINDINGS: Brain: There is no convincing evidence of acute infarct. No evidence of intracranial mass. No midline shift or extra-axial fluid collection. Moderate scattered and confluent T2/FLAIR hyperintensity within the cerebral white matter is nonspecific, but consistent with chronic small vessel ischemic disease. Chronic lacunar infarct within the left basal ganglia. Small amount of hemosiderin  deposition at this site. Additional small chronic infarct within the superior right cerebellum which in retrospect was present on prior brain MRI 11/12/2014. Mild generalized parenchymal atrophy. Vascular: Flow voids maintained within the proximal large arterial vessels. Skull and upper cervical spine: No focal marrow lesion. Incompletely assessed cervical spondylosis Sinuses/Orbits: Visualized orbits demonstrate no acute abnormality. Mild scattered paranasal sinus mucosal thickening greatest within the left maxillary sinus. No significant mastoid effusion. IMPRESSION: 1. No evidence of acute intracranial abnormality, including acute infarction. 2. Moderate chronic small vessel ischemic disease. 3. Redemonstrated chronic left basal ganglia lacunar infarct and small chronic right cerebellar infarct. 4. Mild generalized parenchymal atrophy. Electronically Signed   By: Jackey LogeKyle  Golden   On: 04/10/2019 22:32   Mr Cervical Spine Wo Contrast  Result Date: 04/10/2019 CLINICAL DATA:  Numbness or tingling, paresthesia. Additional history provided: Patient woke up this morning with numbness in left arm and leg. EXAM: MRI CERVICAL SPINE WITHOUT CONTRAST TECHNIQUE: Multiplanar, multisequence MR imaging of the cervical spine was performed. No intravenous contrast was administered. COMPARISON:  Report from radiographs of the cervical spine 04/11/2000 (images unavailable). FINDINGS: Alignment: Reversal of the expected cervical lordosis. 3 mm C2-C3 grade 1 anterolisthesis. 3  mm C7-T1 anterolisthesis. Trace spondylolisthesis at multiple additional levels. Vertebrae: Vertebral body height is maintained. Mild multilevel degenerative endplate irregularity and mild degenerative endplate edema greatest at C4-C5 and C5-C6. Cord: As described under the level by level findings section below. Posterior Fossa, vertebral arteries, paraspinal tissues: Please refer to concurrent noncontrast brain MRI for description of intracranial findings. Preserved flow voids within the included portions of the cervical vertebral arteries. Included paraspinal soft tissues unremarkable. Disc levels: Levels of mild and moderate disc degeneration greatest at the C4-C5 through C6-C7 levels. C2-C3: Grade 1 anterolisthesis. Minimal disc bulging. Facet hypertrophy. No significant spinal canal stenosis or neural foraminal narrowing. C3-C4: Small bulge with bilateral disc osteophyte ridge/uncinate hypertrophy. Mild spinal canal stenosis. Mild right with moderate left neural foraminal narrowing. C4-C5: Posterior disc osteophyte complex. Uncinate, facet and ligamentum flavum hypertrophy. Moderate/advanced spinal canal stenosis with mild spinal cord flattening. No spinal cord signal abnormality identified. Severe bilateral neural foraminal narrowing. C5-C6: Posterior disc osteophyte complex. Uncinate, facet and ligamentum flavum hypertrophy. Advanced spinal canal stenosis with moderate spinal cord flattening. Question subtle T2 hyperintensity within the right aspect of the spinal cord at this level (series 8, image 15) which may reflect myelopathic change. Severe bilateral neural foraminal narrowing. C6-C7: Posterior disc osteophyte complex. Uncinate, facet and ligamentum flavum hypertrophy. Mild spinal canal stenosis. Moderate bilateral neural foraminal narrowing. C7-T1: Grade 1 anterolisthesis. Facet hypertrophy, prominent on the left. Ligamentum flavum hypertrophy. Mild spinal canal stenosis. Mild bilateral neural  foraminal narrowing. IMPRESSION: Cervical spondylosis as detailed and greatest at C4-C5 and C5-C6. At C5-C6, a posterior disc osteophyte complex contributes to advanced spinal canal stenosis with moderate spinal cord flattening. Question subtle signal abnormality within the right aspect of spinal cord at this level, which may reflect myelopathic change. Severe bilateral neural foraminal narrowing. At C4-C5, a posterior disc osteophyte complex contributes to moderate/advanced spinal canal stenosis with mild spinal cord flattening. No spinal cord signal abnormality. Severe bilateral neural foraminal narrowing. No more than mild spinal canal stenosis at the remaining levels. Additional sites of mild and moderate neural foraminal narrowing as described. Electronically Signed   By: Jackey LogeKyle  Golden   On: 04/10/2019 22:53    Imaging studies reviewed, I do not see any acute emergent findings to demonstrate etiology patient symptoms today ____________________________________________   PROCEDURES  Procedure(s) performed: None  Procedures  Critical Care performed: No  ____________________________________________   INITIAL IMPRESSION / ASSESSMENT AND PLAN / ED COURSE  Pertinent labs & imaging results that were available during my care of the patient were reviewed by me and considered in my medical decision making (see chart for details).   Patient presents for evaluation of left-sided paresthesias.  Reassuring neurologic exam, not able to objectively demonstrate the symptoms but she does report slight paresthesia.  Obtain CT negative for acute stroke, MRI also obtained of the head and neck to evaluate for etiology and both of these are largely unremarkable as to cause.  Discussed with the patient some arthritic changes in the neck, but I do not think this clearly explains her symptoms.  Very reassuring exam.  Without evidence of acute stroke.  Discussed with patient and her family at the bedside, comfortable  with plan for discharge and will follow close with primary.        ____________________________________________   FINAL CLINICAL IMPRESSION(S) / ED DIAGNOSES  Final diagnoses:  Paresthesia of left arm and leg        Note:  This document was prepared using Dragon voice recognition software and may include unintentional dictation errors       Sharyn Creamer, MD 04/10/19 2348

## 2019-04-11 ENCOUNTER — Telehealth: Payer: Self-pay | Admitting: Cardiovascular Disease

## 2019-04-11 NOTE — Telephone Encounter (Signed)
Pt calling today to let us know she visited the ED last night for left arm and left leg paresthesia. She called EMS and they told her that her HR was irregular and she needed to go to the ED. Her sx resolved while she was in the ED. Her EKG in the ED showed SR with LBBB. Pt also wanted to discuss her BP. She states "its up and down." I reviewed BP readings with pt and there were a couple high readings. Pt has not taken her BP in a few days and does not know what it is today. I advised her to record her Bps once daily, the same time daily preferably a few hours after she takes her medication. If she consistently SBP>130, she should call the office back to review.   Pt understands I will forward to Christell Faith for further review. Pt is scheduled to see him in a few weeks.   She had no additional questions.

## 2019-04-11 NOTE — Telephone Encounter (Signed)
Pt c/o BP issue: STAT if pt c/o blurred vision, one-sided weakness or slurred speech  1. What are your last 5 BP readings?   9/17 149/69  58  9/18 106/57  52  9/19 151/72  50  9/20 144/71  58  9/21 116/58   Pt went to hospital last night 2. Are you having any other symptoms (ex. Dizziness, headache, blurred vision, passed out)? Lightheadedness, bilateral leg and feet swelling (has increased amlodipine due to diastolic going under 60), numbness in left arm and left leg, irregular HR.  3. What is your BP issue? Keeps increasing after medication has been taken

## 2019-04-15 NOTE — Telephone Encounter (Signed)
Pt is calling back checking on the status for AWV

## 2019-04-16 NOTE — Telephone Encounter (Signed)
Scheduled 10.5.20 with Denisa

## 2019-04-21 ENCOUNTER — Other Ambulatory Visit: Payer: Self-pay

## 2019-04-21 ENCOUNTER — Telehealth: Payer: Self-pay | Admitting: Cardiovascular Disease

## 2019-04-21 ENCOUNTER — Ambulatory Visit (INDEPENDENT_AMBULATORY_CARE_PROVIDER_SITE_OTHER): Payer: Medicare Other

## 2019-04-21 DIAGNOSIS — Z0000 Encounter for general adult medical examination without abnormal findings: Secondary | ICD-10-CM | POA: Diagnosis not present

## 2019-04-21 NOTE — Telephone Encounter (Signed)
Pt c/o swelling: STAT is pt has developed SOB within 24 hours  How much weight have you gained and in what time span?  No weight gain 1) If swelling, where is the swelling located? Bilateral ankles and feet  2) Are you currently taking a fluid pill? no  3) Are you currently SOB? no  No weight logs available  4) Have you gained 3 pounds in a day or 5 pounds in a week? no  5) Have you traveled recently? no

## 2019-04-21 NOTE — Progress Notes (Signed)
Subjective:   Kayla [name] is a 85 y.o. female who presents for Medicare Annual (Subsequent) preventive examination.  Review of Systems:  No ROS.  Medicare Wellness Virtual Visit.  Visual/audio telehealth visit, UTA vital signs.   See social history for additional risk factors.   Cardiac Risk Factors include: advanced age (>855men, 6>65 women);hypertension     Objective:     Vitals: There were no vitals taken for this visit.  There is no height or weight on file to calculate BMI.  Advanced Directives 04/21/2019 04/10/2019 04/12/2017 08/29/2016 04/25/2016 04/17/2016 03/28/2016  Does Patient Have a Medical Advance Directive? Yes Yes No No No No No  Type of Estate agentAdvance Directive Healthcare Power of [place];Living will Healthcare Power of [place] Design[place]orney;Living will - - - - -  Does patient want to make changes to medical advance directive? No - Patient declined No - Patient declined - Yes (MAU/Ambulatory/Procedural Areas - Information given) - - -  Copy of Healthcare Power of Attorney in Chart? Yes - validated most recent copy scanned in chart (See row information) No - copy requested - No - copy requested - - -  Would patient like information on creating a medical advance directive? - - No - Patient declined - No - patient declined information No - patient declined information No - patient declined information    Tobacco Social History   Tobacco Use  Smoking Status Never Smoker  Smokeless Tobacco Never Used     Counseling given: Not Answered   Clinical Intake:  Pre-visit preparation completed: Yes        Diabetes: No  How often do you need to have someone help you when you read instructions, pamphlets, or other written materials from your doctor or pharmacy?: 1 - Never  Interpreter Needed?: No     Past Medical History:  Diagnosis Date  . Anginal pain (HCC)   . Arthritis   . Bundle branch block, left    Chronic  . CAD (coronary artery disease)   . Cholecystitis    from  record  . Emphysema lung (HCC)   . GERD (gastroesophageal reflux disease)   . Heart murmur   . HOH (hard of hearing)   . Hyperlipidemia   . Hypertension   . Hypothyroidism   . Orthostatic headache    Mild  . Pain    HIP AND LEG PAIN USES  CANE  . Vertigo    Past Surgical History:  Procedure Laterality Date  . APPENDECTOMY  1957  . BREAST BIOPSY Left 2011   neg, stereo done by Dr. Evette CristalSankar  . BREAST BIOPSY    . CARDIAC CATHETERIZATION  2000   [facility]  . CATARACT EXTRACTION Right    right 2004, left 2018   . CATARACT EXTRACTION W/PHACO Left 04/12/2017   Procedure: CATARACT EXTRACTION PHACO AND INTRAOCULAR LENS PLACEMENT (IOC);  Surgeon: Lockie MolaBrasington, Chadwick, MD;  Location: [facility] ORS;  Service: Ophthalmology;  Laterality: Left;  US 01:09.0AP% 23.7CDE 16.35FLUID PACK LOT # [id number]  . CHOLECYSTECTOMY N/A 04/25/2016   Procedure: LAPAROSCOPIC CHOLECYSTECTOMY WITH INTRAOPERATIVE CHOLANGIOGRAM;  Surgeon: Kieth BrightlySeeplaputhur G Sankar, MD;  Location: [facility] ORS;  Service: General;  Laterality: N/A;  . CORONARY ANGIOPLASTY     STENT  . CORONARY ARTERY BYPASS GRAFT  2001  . KNEE SURGERY    . thyroid  1970  . UPPER GI ENDOSCOPY  2014   Family History  Problem Relation Age of Onset  . Heart failure Mother 8076  . Heart failure Sister 2548  .  Heart attack Sister   . Heart attack Brother 29   Social History   Socioeconomic History  . Marital status: Widowed    Spouse name: Not on file  . Number of children: Not on file  . Years of education: Not on file  . Highest education level: Not on file  Occupational History  . Occupation: Part time  Social Needs  . Financial resource strain: Not hard at all  . Food insecurity    Worry: Never true    Inability: Never true  . Transportation needs    Medical: No    Non-medical: No  Tobacco Use  . Smoking status: Never Smoker  . Smokeless tobacco: Never Used  Substance and Sexual Activity  . Alcohol use: No  . Drug use: No  . Sexual activity: Never   Lifestyle  . Physical activity    Days per week: 0 days    Minutes per session: Not on file  . Stress: Not at all  Relationships  . Social Musician on phone: Not on file    Gets together: Not on file    Attends religious service: Not on file    Active member of club or organization: Not on file    Attends meetings of clubs or organizations: Not on file    Relationship status: Not on file  Other Topics Concern  . Not on file  Social History Narrative   Widow   Lives with one one her daughters      6 children      Lives in [place]             Outpatient Encounter Medications as of 04/21/2019  Medication Sig  . amLODipine (NORVASC) 10 MG tablet TAKE 1 TABLET BY MOUTH  DAILY  . aspirin 81 MG tablet Take 81 mg by mouth daily.    . carvedilol (COREG) 12.5 MG tablet Take 1 tablet (12.5 mg total) by mouth 2 (two) times daily.  . cloNIDine (CATAPRES) 0.1 MG tablet TAKE 1 TABLET BY MOUTH TWO  TIMES DAILY  . Cyanocobalamin (VITAMIN B 12 PO) Take 3,000 Units by mouth daily.  . famotidine (PEPCID) 20 MG tablet TAKE 1 TABLET BY MOUTH  TWICE DAILY  . fluticasone (FLONASE) 50 MCG/ACT nasal spray Place 2 sprays into both nostrils daily.  [name] levothyroxine (SYNTHROID) 100 MCG tablet TAKE 1 TABLET BY MOUTH  DAILY BEFORE BREAKFAST  . losartan (COZAAR) 100 MG tablet TAKE 1 TABLET BY MOUTH  DAILY  . nitroGLYCERIN (NITROSTAT) 0.4 MG SL tablet Place 1 tablet (0.4 mg total) under the tongue every 5 (five) minutes as needed for chest pain.  . pravastatin (PRAVACHOL) 20 MG tablet TAKE 1 TABLET BY MOUTH AT  BEDTIME   No facility-administered encounter medications on file as of 04/21/2019.     Activities of Daily Living In your present state of health, do you have any difficulty performing the following activities: 04/21/2019 03/13/2019  Hearing? N N  Vision? N N  Difficulty concentrating or making decisions? N N  Walking or climbing stairs? Y N  Comment Cane in use when ambulating. -   Dressing or bathing? N N  Doing errands, shopping? Y N  Comment She does not drive. -  Preparing Food and eating ? N -  Using the Toilet? N -  In the past six months, have you accidently leaked urine? N -  Comment Managed with daily brief -  Do you have problems with loss of  bowel control? N -  Managing your Medications? N -  Managing your Finances? N -  Housekeeping or managing your Housekeeping? N -  Some recent data might be hidden    Patient Care Team: Allegra Grana, FNP as PCP - General (Family Medicine) Antonieta Iba, MD as PCP - Cardiology (Cardiology) Lyndon Code, MD (Internal Medicine) Kieth Brightly, MD (General Surgery)    Assessment:   This is a routine wellness examination for Shearon.  I connected with patient 04/21/19 at 11:30 AM EDT by an audio enabled telemedicine application and verified that I am speaking with the correct person using two identifiers. Patient stated full name and DOB. Patient gave permission to continue with virtual visit. Patient's location was at home and Nurse's location was at [place] office.   Health Maintenance Due: -Tdap- discussed; to be completed with doctor in visit or local pharmacy.   -Update all pending maintenance due as appropriate.   See completed HM at the end of note.   Eye: Visual acuity not assessed. Virtual visit. Followed by their ophthalmologist.   Dental: Dentures- yes  Hearing: Hearing aids- yes  Safety:  Patient feels safe at home- yes Patient does have smoke detectors at home- yes Patient does wear sunscreen or protective clothing when in direct sunlight - yes Patient does wear seat belt when in a moving vehicle - yes Patient drives- yes Adequate lighting in walkways free from debris- yes Grab bars and handrails used as appropriate- yes Ambulates with a cane as an assistive device Cell phone or lifeline/life alert/medic alert on person when ambulating outside of the home- yes  Social:  Alcohol intake - no     Smoking history- never   Smokers in home? none Illicit drug use? none  Depression: PHQ 2 &9 complete. See screening below. Denies irritability, anhedonia, sadness/tearfullness.     Falls: See screening below.  None since last reported.  Medication: Taking as directed and without issues.   Covid-19: Precautions and sickness symptoms discussed. Wears mask, social distancing, hand hygiene as appropriate.   Activities of Daily Living Patient denies needing assistance with: household chores, feeding themselves, getting from bed to chair, getting to the toilet, bathing/showering, dressing, managing money, or preparing meals.   Memory: Patient is alert. Patient denies difficulty focusing or concentrating. Correctly identified the president of the [place], season and recall. Patient likes to read for brain stimulation.  BMI- discussed the importance of a healthy diet, water intake and the benefits of aerobic exercise.  Educational material provided.  Physical activity- no routine   Diet: good appetite Water: good intake  Other Providers Patient Care Team: Allegra Grana, FNP as PCP - General (Family Medicine) Antonieta Iba, MD as PCP - Cardiology (Cardiology) Lyndon Code, MD (Internal Medicine) Kieth Brightly, MD (General Surgery)  Exercise Activities and Dietary recommendations Current Exercise Habits: The patient does not participate in regular exercise at present  Goals      Patient Stated   . Increase physical activity (pt-stated)     I plan to go to the wellness center for exercise        Fall Risk Fall Risk  03/13/2019 01/27/2019 02/20/2017 12/12/2016 08/09/2016  Falls in the past year? 1 1 No No No  Number falls in past yr: 0 1 - - -  Injury with Fall? 1 0 - - -  Risk for fall due to : History of fall(s) History of fall(s) - - -  Follow  up Falls evaluation completed - - - -  Timed Get Up and Go performed: no, virtual visit   Depression Screen PHQ 2/9 Scores 04/21/2019 01/27/2019 07/29/2018 02/20/2017  PHQ - 2 Score 0 0 0 0  PHQ- 9 Score - - 4 -     Cognitive Function MMSE - Mini Mental State Exam 08/29/2016  Orientation to time 5  Orientation to Place 5  Registration 3  Attention/ Calculation 5  Recall 3  Language- name 2 objects 2  Language- repeat 1  Language- follow 3 step command 3  Language- read & follow direction 1  Write a sentence 1  Copy design 1  Total score 30     6CIT Screen 04/21/2019  What Year? 0 points  What month? 0 points  What time? 0 points  Count back from 20 0 points  Months in reverse 0 points  Repeat phrase 0 points  Total Score 0    Immunization History  Administered Date(s) Administered  . Influenza Split 05/07/2015  . Influenza, High Dose Seasonal PF 05/15/2018, 04/21/2019  . Influenza,inj,quad, With Preservative 04/25/2017  . Influenza-Unspecified 04/17/2016, 04/25/2017  . Pneumococcal Conjugate-13 06/23/2015  . Pneumococcal Polysaccharide-23 04/27/2016   Screening Tests Health Maintenance  Topic Date Due  . TETANUS/TDAP  04/21/1946  . INFLUENZA VACCINE  05/18/2019 (Originally 02/15/2019)  . DEXA SCAN  Completed  . PNA vac Low Risk Adult  Completed      Plan:   Keep all routine maintenance appointments.   Patient reports swelling in the feet. Denies pain. Unsure of pitting edema. She is able to wear shoes without issues. Started amlodipine 30 days ago. Scheduled with her cardiologist 05/06/19. She plans to call her cardiologist to make aware upon completion of this call today.   Medicare Attestation I have personally reviewed: The patient's medical and social history Their use of alcohol, tobacco or illicit drugs Their current medications and supplements The patient's functional ability including ADLs,fall risks, home safety risks, cognitive, and hearing and visual impairment Diet and physical activities Evidence for depression   In addition, I have  reviewed and discussed with patient certain preventive protocols, quality metrics, and best practice recommendations. A written personalized care plan for preventive services as well as general preventive health recommendations were provided to patient via mail.     Varney Biles, LPN  [id number]

## 2019-04-21 NOTE — Patient Instructions (Signed)
  Kayla [name] , Thank you for taking time to come for your Medicare Wellness Visit. I appreciate your ongoing commitment to your health goals. Please review the following plan we discussed and let me know if I can assist you in the future.   These are the goals we discussed: Goals      Patient Stated   . Increase physical activity (pt-stated)     I plan to go to the wellness center for exercise        This is a list of the screening recommended for you and due dates:  Health Maintenance  Topic Date Due  . Tetanus Vaccine  04/21/1946  . Flu Shot  05/18/2019*  . DEXA scan (bone density measurement)  Completed  . Pneumonia vaccines  Completed  *Topic was postponed. The date shown is not the original due date.

## 2019-04-21 NOTE — Telephone Encounter (Signed)
I spoke to the patient who has noticed swelling in her ankles/feet with no other symptoms like CP and SOB.  She has no means of weighing herself, but will reach out to someone for a scale.  She will check her weights daily and report later this week.  She will watch her salt intake and keep legs elevated when sitting.

## 2019-04-24 NOTE — Progress Notes (Signed)
Cardiology Office Note    Date:  05/06/2019   ID:  [name], [name] 01-15-1927, MRN [id number]  PCP:  Burnard Hawthorne, FNP  Cardiologist:  Ida Rogue, MD  Electrophysiologist:  None   Chief Complaint: Follow up  History of Present Illness:   Kayla [name] is a 85 y.o. female with history of CAD status post three-vessel CABG on 7/82/9562 at [facility], systolic dysfunction, aortic regurgitation, PVCs, carotid artery disease, HTN, HLD, LBBB, hypothyroidism, vertigo, and GERD who presents for evaluation of multiple complaints as outlined below.  Patient previously underwent three-vessel CABG at [facility] on 12/15/1998 with LIMA to LAD, SVG to OM2, and SVG to PDA.  No ischemic evaluations since.  Most recent echo from 10/08/2012 showed an EF of 45 to 50%, mild concentric LVH, septal wall motion abnormality consistent with conduction delay, grade 1 diastolic dysfunction, mild to moderate aortic regurgitation, mild mitral regurgitation, normal RV systolic function, mild to moderate tricuspid regurgitation, PASP 31 mmHg.  She was most recently seen in the office in 05/2018 and reported stable lower extremity swelling with chronic shortness of breath.  Blood pressures at home ranged from the 130Q to 657Q systolic.  Stress testing was deferred.  Most recent carotid artery ultrasound from 01/2019, showed 40 to 59% right ICA stenosis with 1 to 39% left ICA stenosis with bilateral vertebral arteries demonstrating antegrade flow and normal flow hemodynamics in the bilateral subclavian arteries.  More recently, patient has been followed by PCPs office for elevated BP readings and nonexertional chest pain earlier this month with BP being in the 469G systolic and EKG demonstrating known left bundle.  She was started on Imdur.  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   Following addition of Imdur, patient reported a syncopal episode.  Patient indicated she was taking a bath and started to feel faint leading her to get out of the bathtub and sit on the toilet where she subsequently fainted.  Notes indicate no head trauma.  She contacted PCPs on-call provider who advised the patient to discontinue Imdur and start amlodipine 10 mg daily.  Upon EMS arrival BP was 135/66.  She was most recently seen by PCP on 03/28/2019 with a BP of 150/60.  In the past, she has had hyponatremia with thiazide diuretic.  She was seen in the ED on 04/10/2019 after waking up with tingling in her left forearm and left leg which persisted throughout the day.  Labs showed a mild hyponatremia of 133, potassium 4.5, glucose 165, BUN 24, serum creatinine 1.13, albumin 4.2, AST/ALT normal, CBC unremarkable.  CT head showed no acute intracranial hemorrhage with mild related atrophy and chronic microvascular ischemic changes as well as a small old left basal ganglia lacunar infarct.  MRI of the brain showed no evidence of acute intracranial abnormality with moderate chronic small vessel ischemic disease and again a redemonstrated chronic left basal ganglia lacunar infarct and small chronic right cerebellar infarct.  MRI of the cervical spine showed cervical spondylosis involving C4-C5 and C5-C6.  EKG demonstrated sinus rhythm with known left bundle.  Current BP regimen includes: Amlodipine 10 mg, carvedilol 12.5 mg twice daily, clonidine 0.1 mg twice daily, losartan 100 mg.  Patient comes in with multiple complaints today:  1) Syncope: Patient had an episode of unwitnessed syncope in the early part of 03/2019.  She had recently  been started on isosorbide mononitrate for elevated BP.  She was sitting in a bath and began to feel dizzy.  She got out of the bath and sat on the toilet.  Following this she reportedly suffered a syncopal episode falling off the toilet and striking her left shoulder.  She did not hit  her head.  She is uncertain for how long she was without consciousness.  She denied any preceding chest pain, palpitations, or diaphoresis.  Following this, her Imdur was discontinued and she was placed back on amlodipine.  She has not had any further syncopal episodes since.  2) Irregular heartbeat/palpitations: Patient indicates a long history of intermittent palpitations that occur sporadically, sometimes once or twice per month and are short-lived.  Notes indicate EMS detected an "irregular heartbeat" when she was evaluated in late 03/2019 for paresthesias.  No objective evidence of A. fib/flutter in epic for review.  3) Elevated BP: Over the past several weeks patient has noted an improvement in her overall blood pressure with most readings in the 130s systolic with a rare reading in the 1 teens systolic.  She indicates she has been on amlodipine for many years and has chronic lower extremity swelling in this setting.  She seems to have tolerated amlodipine at 5 mg reasonably well.  She has also been maintained on Coreg, clonidine, and losartan.   Labs: 03/2019 - BUN 22, serum creatinine 1.07 potassium 4.1 07/2018 - total cholesterol 114, triglycerides 60, HDL 54, LDL 45, A1c 6.0, albumin 4.0, AST/ALT normal, Hgb 12.6, PLT 148, TSH normal  Past Medical History:  Diagnosis Date   Anginal pain (HCC)    Arthritis    Bundle branch block, left    Chronic   CAD (coronary artery disease)    Cholecystitis    from record   Emphysema lung (HCC)    GERD (gastroesophageal reflux disease)    Heart murmur    HOH (hard of hearing)    Hyperlipidemia    Hypertension    Hypothyroidism    Orthostatic headache    Mild   Pain    HIP AND LEG PAIN USES  CANE   Vertigo     Past Surgical History:  Procedure Laterality Date   APPENDECTOMY  1957   BREAST BIOPSY Left 2011   neg, stereo done by Dr. Evette CristalSankar   BREAST BIOPSY     CARDIAC CATHETERIZATION  2000   [facility]   CATARACT  EXTRACTION Right    right 2004, left 2018    CATARACT EXTRACTION W/PHACO Left 04/12/2017   Procedure: CATARACT EXTRACTION PHACO AND INTRAOCULAR LENS PLACEMENT (IOC);  Surgeon: Lockie MolaBrasington, Chadwick, MD;  Location: [facility] ORS;  Service: Ophthalmology;  Laterality: Left;  US 01:09.0AP% 23.7CDE 16.35FLUID PACK LOT # [id number]   CHOLECYSTECTOMY N/A 04/25/2016   Procedure: LAPAROSCOPIC CHOLECYSTECTOMY WITH INTRAOPERATIVE CHOLANGIOGRAM;  Surgeon: Kieth BrightlySeeplaputhur G Sankar, MD;  Location: [facility] ORS;  Service: General;  Laterality: N/A;   CORONARY ANGIOPLASTY     STENT   CORONARY ARTERY BYPASS GRAFT  2001   KNEE SURGERY     thyroid  1970   UPPER GI ENDOSCOPY  2014    Current Medications: Current Meds  Medication Sig   amLODipine (NORVASC) 10 MG tablet TAKE 1 TABLET BY MOUTH  DAILY (Patient taking differently: Take 5 mg by mouth daily. )   aspirin 81 MG tablet Take 81 mg by mouth daily.     carvedilol (COREG) 12.5 MG tablet Take 1 tablet (12.5 mg total) by mouth 2 (  two) times daily.   cloNIDine (CATAPRES) 0.1 MG tablet TAKE 1 TABLET BY MOUTH TWO  TIMES DAILY   Cyanocobalamin (VITAMIN B 12 PO) Take 3,000 Units by mouth daily.   famotidine (PEPCID) 20 MG tablet TAKE 1 TABLET BY MOUTH  TWICE DAILY   fluticasone (FLONASE) 50 MCG/ACT nasal spray Place 2 sprays into both nostrils daily.   levothyroxine (SYNTHROID) 100 MCG tablet TAKE 1 TABLET BY MOUTH  DAILY BEFORE BREAKFAST   losartan (COZAAR) 100 MG tablet TAKE 1 TABLET BY MOUTH  DAILY   nitroGLYCERIN (NITROSTAT) 0.4 MG SL tablet Place 1 tablet (0.4 mg total) under the tongue every 5 (five) minutes as needed for chest pain.   pravastatin (PRAVACHOL) 20 MG tablet TAKE 1 TABLET BY MOUTH AT  BEDTIME    Allergies:   Bee pollen, Boniva [ibandronic acid], Pollen extract, and Motrin [ibuprofen]   Social History   Socioeconomic History   Marital status: Widowed    Spouse name: Not on file   Number of children: Not on file   Years of  education: Not on file   Highest education level: Not on file  Occupational History   Occupation: Part time  Social Needs   Financial resource strain: Not hard at all   Food insecurity    Worry: Never true    Inability: Never true   Transportation needs    Medical: No    Non-medical: No  Tobacco Use   Smoking status: Never Smoker   Smokeless tobacco: Never Used  Substance and Sexual Activity   Alcohol use: No   Drug use: No   Sexual activity: Never  Lifestyle   Physical activity    Days per week: 0 days    Minutes per session: Not on file   Stress: Not at all  Relationships   Social connections    Talks on phone: Not on file    Gets together: Not on file    Attends religious service: Not on file    Active member of club or organization: Not on file    Attends meetings of clubs or organizations: Not on file    Relationship status: Not on file  Other Topics Concern   Not on file  Social History Narrative   Widow   Lives with one one her daughters      6 children      Lives in [place]              Family History:  The patient's family history includes Heart attack in her sister; Heart attack (age of onset: 47) in her brother; Heart failure (age of onset: 5) in her sister; Heart failure (age of onset: 67) in her mother.  ROS:   Review of Systems  Constitutional: Positive for malaise/fatigue. Negative for chills, diaphoresis, fever and weight loss.  HENT: Negative for congestion.   Eyes: Negative for discharge and redness.  Respiratory: Negative for cough, hemoptysis, sputum production, shortness of breath and wheezing.   Cardiovascular: Positive for palpitations. Negative for chest pain, orthopnea, claudication, leg swelling and PND.  Gastrointestinal: Negative for abdominal pain, blood in stool, heartburn, melena, nausea and vomiting.  Genitourinary: Negative for hematuria.  Musculoskeletal: Negative for falls and myalgias.  Skin: Negative for  rash.  Neurological: Positive for tingling, sensory change and weakness. Negative for dizziness, tremors, speech change, focal weakness and loss of consciousness.  Endo/Heme/Allergies: Does not bruise/bleed easily.  Psychiatric/Behavioral: Negative for substance abuse. The patient is not nervous/anxious.   All  other systems reviewed and are negative.    EKGs/Labs/Other Studies Reviewed:    Studies reviewed were summarized above. The additional studies were reviewed today: As above  EKG:  EKG is ordered today.  The EKG ordered today demonstrates sinus bradycardia, 56 bpm, LBBB (known  Recent Labs: 07/29/2018: TSH 1.05 04/10/2019: ALT 12; BUN 24; Creatinine, Ser 1.13; Hemoglobin 12.9; Platelets 168; Potassium 4.5; Sodium 133  Recent Lipid Panel    Component Value Date/Time   CHOL 114 07/29/2018 1109   TRIG 68.0 07/29/2018 1109   HDL 54.60 07/29/2018 1109   CHOLHDL 2 07/29/2018 1109   VLDL 13.6 07/29/2018 1109   LDLCALC 45 07/29/2018 1109    PHYSICAL EXAM:    VS:  BP (!) 162/64 (BP Location: Left Arm, Patient Position: Sitting, Cuff Size: Normal)    Pulse (!) 56    Ht 5\' 1"  (1.549 m)    Wt 151 lb 12 oz (68.8 kg)    SpO2 98%    BMI 28.67 kg/m   BMI: Body mass index is 28.67 kg/m.  Physical Exam  Constitutional: She is oriented to person, place, and time. She appears well-developed and well-nourished.  HENT:  Head: Normocephalic and atraumatic.  Eyes: Right eye exhibits no discharge. Left eye exhibits no discharge.  Neck: Normal range of motion. No JVD present.  Cardiovascular: Normal rate, regular rhythm, S1 normal and S2 normal. Exam reveals no distant heart sounds, no friction rub, no midsystolic click and no opening snap.  Murmur heard. High-pitched blowing holosystolic murmur is present with a grade of 2/6 at the apex. High-pitched blowing decrescendo early diastolic murmur is present with a grade of 2/6 at the upper right sternal border radiating to the  apex. Pulmonary/Chest: Effort normal and breath sounds normal. No respiratory distress. She has no decreased breath sounds. She has no wheezes. She has no rales. She exhibits no tenderness.  Abdominal: Soft. She exhibits no distension. There is no abdominal tenderness.  Musculoskeletal:        General: Edema present.     Comments: Chronic lymphedema noted bilaterally  Neurological: She is alert and oriented to person, place, and time.  Skin: Skin is warm and dry. No cyanosis. Nails show no clubbing.  Psychiatric: She has a normal mood and affect. Her speech is normal and behavior is normal. Judgment and thought content normal.    Wt Readings from Last 3 Encounters:  05/06/19 151 lb 12 oz (68.8 kg)  04/10/19 152 lb (68.9 kg)  04/08/19 150 lb 9.6 oz (68.3 kg)     ASSESSMENT & PLAN:   1. CAD status post CABG without angina: She denies any symptoms concerning for angina.  If the below work-up is unrevealing could consider Lexiscan Myoview to evaluate for high risk ischemia though given the patient's age it remains uncertain if she would want to proceed with this or not.  Continue current medical therapy.  2. Syncope: Of uncertain etiology.  Schedule ZIO and echo to further evaluate her valvular heart disease as outlined below.  Appears to be less likely orthostatic given history.  No driving.  3. Palpitations: Longstanding issue.  Cannot exclude arrhythmia playing a role in her paresthesias as well as potential posttermination pause leading to her possible syncopal episode.  Place ZIO monitor today.  Recent potassium at goal as outlined above.  Continue current dose of Coreg.  4. Systolic dysfunction: She appears euvolemic and well compensated.  Update echo.  Continue carvedilol and losartan.  Not requiring standing diuretic  at this time.  5. Valvular heart disease: Update echo as above.  6. Carotid artery disease: Recent carotid artery ultrasound from 01/2019 demonstrated 40 to 59% stenosis  along the right internal carotid artery with less than 39% stenosis along the left internal carotid artery with bilateral antegrade flow of the vertebral arteries and normal flow hemodynamics of the bilateral subclavian arteries.  7. Left-sided paresthesias: Imaging negative for acute CVA with demonstration of prior infarcts noted.  Cardiac work-up as outlined above.  8. Hypertension: Blood pressure seems to have stabilized more recently.  We will add hydralazine 25 mg 3 times daily as needed for systolic blood pressure greater than 150 mmHg.  Otherwise, she will continue current regimen including amlodipine, carvedilol, clonidine, and losartan.  9. Hyperlipidemia: LDL 45 from 07/2018.  Remains on pravastatin.  Disposition: F/u with Dr. Mariah Milling or an APP in 6 weeks.   Medication Adjustments/Labs and Tests Ordered: Current medicines are reviewed at length with the patient today.  Concerns regarding medicines are outlined above. Medication changes, Labs and Tests ordered today are summarized above and listed in the Patient Instructions accessible in Encounters.   Signed, Eula Listen, PA-C 05/06/2019 4:15 PM     [facility] [address] [place], [place] [address] [phone]

## 2019-04-24 NOTE — Progress Notes (Signed)
I have reviewed the above note and agree.  Deyanira Fesler, M.D.  

## 2019-04-25 NOTE — Telephone Encounter (Signed)
Patient weight log  10/5  157  10/6  156  10/7  156  10/8  150  10/9  150

## 2019-04-25 NOTE — Telephone Encounter (Signed)
Spoke with the patient. Patient was given a scale by her son and has attempted to weigh her self daily. She had not been given instructions on weighing herself appropriately and had been checking her wight different times a day. Based on the patient report there would be a 7 lb weight loss in 5 days. She denies a decrease in her caloric intake. Her LE edema has improved. She elevates her legs as much as she can. She denies sob, orthopnea, PND. Instructed the patient to continue weigh herself daily. Adv the patient that she should check her weights daily under the same circumstances. Adv the patient after awakening in the morning - Empty her bladder - Weight before breakfast - Wear the same amount of clothes.  Asked the patient to call back next week to report her weights after applying the weighing instruction given. Patient verbalized understanding and voiced appreciation for the assistance.

## 2019-05-06 ENCOUNTER — Ambulatory Visit (INDEPENDENT_AMBULATORY_CARE_PROVIDER_SITE_OTHER): Payer: Medicare Other

## 2019-05-06 ENCOUNTER — Ambulatory Visit (INDEPENDENT_AMBULATORY_CARE_PROVIDER_SITE_OTHER): Payer: Medicare Other | Admitting: Physician Assistant

## 2019-05-06 ENCOUNTER — Encounter: Payer: Self-pay | Admitting: Physician Assistant

## 2019-05-06 ENCOUNTER — Other Ambulatory Visit: Payer: Self-pay

## 2019-05-06 VITALS — BP 162/64 | HR 56 | Ht 61.0 in | Wt 151.8 lb

## 2019-05-06 DIAGNOSIS — R55 Syncope and collapse: Secondary | ICD-10-CM | POA: Diagnosis not present

## 2019-05-06 DIAGNOSIS — I2510 Atherosclerotic heart disease of native coronary artery without angina pectoris: Secondary | ICD-10-CM | POA: Diagnosis not present

## 2019-05-06 DIAGNOSIS — R002 Palpitations: Secondary | ICD-10-CM

## 2019-05-06 DIAGNOSIS — I10 Essential (primary) hypertension: Secondary | ICD-10-CM

## 2019-05-06 DIAGNOSIS — I351 Nonrheumatic aortic (valve) insufficiency: Secondary | ICD-10-CM

## 2019-05-06 DIAGNOSIS — I519 Heart disease, unspecified: Secondary | ICD-10-CM | POA: Diagnosis not present

## 2019-05-06 DIAGNOSIS — I6523 Occlusion and stenosis of bilateral carotid arteries: Secondary | ICD-10-CM

## 2019-05-06 DIAGNOSIS — R202 Paresthesia of skin: Secondary | ICD-10-CM

## 2019-05-06 DIAGNOSIS — I340 Nonrheumatic mitral (valve) insufficiency: Secondary | ICD-10-CM

## 2019-05-06 DIAGNOSIS — E785 Hyperlipidemia, unspecified: Secondary | ICD-10-CM

## 2019-05-06 MED ORDER — HYDRALAZINE HCL 25 MG PO TABS: 25.0000 mg | ORAL_TABLET | Freq: Three times a day (TID) | ORAL | 3 refills | Status: DC | PRN

## 2019-05-06 NOTE — Patient Instructions (Signed)
Medication Instructions:  1- START Hydralazine Take 1 tablet (25 mg total) by mouth 3 (three) times daily as needed (SBP > 150).,  *If you need a refill on your cardiac medications before your next appointment, please call your pharmacy*  Lab Work: None ordered  If you have labs (blood work) drawn today and your tests are completely normal, you will receive your results only by: [name] MyChart Message (if you have MyChart) OR . A paper copy in the mail If you have any lab test that is abnormal or we need to change your treatment, we will call you to review the results.  Testing/Procedures: 1- A zio monitor was placed today. It will remain on for 14 days. You will then return monitor and event diary in provided box. It takes 1-2 weeks for report to be downloaded and returned to [place]. We will call you with the results. If monitor falls of or has orange flashing light, please call Zio for further instructions.   2- Echo  Please return to [facility] on ______________ at _______________ AM/PM for an Echocardiogram. Your physician has requested that you have an echocardiogram. Echocardiography is a painless test that uses sound waves to create images of your heart. It provides your doctor with information about the size and shape of your heart and how well your heart's chambers and valves are working. This procedure takes approximately one hour. There are no restrictions for this procedure. Please note; depending on visual quality an IV may need to be placed.    Follow-Up: At [facility], you and your health needs are our priority.  As part of our continuing mission to provide you with exceptional heart care, we have created designated Provider Care Teams.  These Care Teams include your primary Cardiologist (physician) and Advanced Practice Providers (APPs -  Physician Assistants and Nurse Practitioners) who all work together to provide you with the care you need, when you need it.  Your  next appointment:   6 weeks  The format for your next appointment:   In Person  Provider:    You may see Ida Rogue, MD or Christell Faith, PA-C.

## 2019-05-20 ENCOUNTER — Telehealth (INDEPENDENT_AMBULATORY_CARE_PROVIDER_SITE_OTHER): Payer: Self-pay | Admitting: Vascular Surgery

## 2019-05-20 ENCOUNTER — Other Ambulatory Visit: Payer: Self-pay | Admitting: Cardiovascular Disease

## 2019-05-20 ENCOUNTER — Other Ambulatory Visit: Payer: Self-pay | Admitting: Family

## 2019-05-20 DIAGNOSIS — M2550 Pain in unspecified joint: Secondary | ICD-10-CM

## 2019-05-20 DIAGNOSIS — I2510 Atherosclerotic heart disease of native coronary artery without angina pectoris: Secondary | ICD-10-CM

## 2019-05-20 NOTE — Telephone Encounter (Signed)
Looking at our records when we saw her on 09/22 her weight was 150lbs and when she saw cardiology on 10/20 it was 150 lbs. So in that one month time frame there hasn't been a significant weight loss.  I also noticed that she was started on diuretics which can affect your weight and cause you to lose several pounds quickly.  Find out what her current weight is and if she is having any abdominal pain that may get worse with eating.  If she isn't having pain and she hasn't lost more than 10 lbs, we will probably ok to wait for her appointment scheduled 11/30

## 2019-05-21 ENCOUNTER — Telehealth: Payer: Self-pay

## 2019-05-21 MED ORDER — HYDRALAZINE HCL 25 MG PO TABS: 25.0000 mg | ORAL_TABLET | Freq: Three times a day (TID) | ORAL | 0 refills | Status: DC | PRN

## 2019-05-21 NOTE — Telephone Encounter (Signed)
Patient was ask current questions and stated that her current weight is 142lb and she is not having any abdominal pain. The patient was advise to keep current appointment as schedule and she verbalized understanding

## 2019-05-21 NOTE — Telephone Encounter (Signed)
Requested Prescriptions   Signed Prescriptions Disp Refills  . hydrALAZINE (APRESOLINE) 25 MG tablet 90 tablet 0    Sig: Take 1 tablet (25 mg total) by mouth 3 (three) times daily as needed (SBP > 150).    Authorizing Provider: Rise Mu    Ordering User: Raelene Bott, BRANDY L

## 2019-05-23 ENCOUNTER — Other Ambulatory Visit: Payer: Self-pay

## 2019-05-23 DIAGNOSIS — I10 Essential (primary) hypertension: Secondary | ICD-10-CM

## 2019-05-23 MED ORDER — CARVEDILOL 12.5 MG PO TABS: 12.5000 mg | ORAL_TABLET | Freq: Two times a day (BID) | ORAL | 0 refills | Status: DC

## 2019-06-11 ENCOUNTER — Ambulatory Visit: Payer: Medicare Other | Admitting: Cardiovascular Disease

## 2019-06-16 ENCOUNTER — Ambulatory Visit (INDEPENDENT_AMBULATORY_CARE_PROVIDER_SITE_OTHER): Payer: Medicare Other

## 2019-06-16 ENCOUNTER — Ambulatory Visit (INDEPENDENT_AMBULATORY_CARE_PROVIDER_SITE_OTHER): Payer: Medicare Other | Admitting: Nurse Practitioner

## 2019-06-16 ENCOUNTER — Other Ambulatory Visit: Payer: Self-pay

## 2019-06-16 DIAGNOSIS — K551 Chronic vascular disorders of intestine: Secondary | ICD-10-CM

## 2019-06-16 NOTE — Progress Notes (Signed)
Cardiology Office Note  Date:  06/17/2019   ID:  Kayla [name], DOB 19-Dec-1926, MRN [id number]  PCP:  Burnard Hawthorne, FNP   Chief Complaint  Patient presents with  . other    6 wk f/u echo no complaints today. Meds reviewed verbally with pt.    HPI:  [name] is an 85 y/o woman with h/o  CAD s/p CABG in 2001 at [facility] [place], LBBB,  hyperlipidemia,  GERD   HTN.  Myoview in 2009 was normal.  Mild aortic valve stenosis History of PVCs Here for routine f/u Of her coronary artery disease and hypertension    unwitnessed syncope 03/2019.   recently been started on isosorbide mononitrate for elevated BP.  sitting in a bath and began to feel dizzy.   he got out of the bath and sat on the toilet.   syncopal episode falling off the toilet and striking her left shoulder.   Imdur was discontinued and she was placed back on amlodipine.   Reports taking 4 blood pressure medications in the morning, 2 in the evening Blood pressure seems to go up and down quite a bit  Chronic lower extremity swelling, does not wear compression hose  Echocardiogram images pulled up in the office today and discussed with her, normal LV function Mild AI, TR, MR  Chronic knee pain  Lab work reviewed total cholesterol 124 HBA1C 6.0 CR 1.14  EKG personally reviewed by myself on todays visit Shows normal sinus rhythm rate 52 bpm left bundle branch block  Last carotid u/s in 2015 Carotid ultrasound done at outside facility showing 60% right carotid stenosis, minimal on the left  Other past medical history Previous admission to the hospital for chest discomfort. Rule out and was sent home Previous GERD symptoms, takes proton pump inhibitor  PMH:   has a past medical history of Anginal pain ([place]), Arthritis, Bundle branch block, left, CAD (coronary artery disease), Cholecystitis, Emphysema lung ([place]), GERD (gastroesophageal reflux disease), Heart murmur, HOH (hard of hearing), Hyperlipidemia,  Hypertension, Hypothyroidism, Orthostatic headache, Pain, and Vertigo.  PSH:    Past Surgical History:  Procedure Laterality Date  . APPENDECTOMY  1957  . BREAST BIOPSY Left 2011   neg, stereo done by Dr. Jamal Collin  . BREAST BIOPSY    . CARDIAC CATHETERIZATION  2000   [facility]  . CATARACT EXTRACTION Right    right 2004, left 2018   . CATARACT EXTRACTION W/PHACO Left 04/12/2017   Procedure: CATARACT EXTRACTION PHACO AND INTRAOCULAR LENS PLACEMENT (IOC);  Surgeon: Leandrew Koyanagi, MD;  Location: [facility] ORS;  Service: Ophthalmology;  Laterality: Left;  [place] 01:09.0AP% 23.7CDE 16.35FLUID PACK LOT # [id number] H  . CHOLECYSTECTOMY N/A 04/25/2016   Procedure: LAPAROSCOPIC CHOLECYSTECTOMY WITH INTRAOPERATIVE CHOLANGIOGRAM;  Surgeon: Christene Lye, MD;  Location: [facility] ORS;  Service: General;  Laterality: N/A;  . CORONARY ANGIOPLASTY     STENT  . CORONARY ARTERY BYPASS GRAFT  2001  . KNEE SURGERY    . thyroid  1970  . UPPER GI ENDOSCOPY  2014    Current Outpatient Medications  Medication Sig Dispense Refill  . amLODipine (NORVASC) 10 MG tablet TAKE 1 TABLET BY MOUTH  DAILY (Patient taking differently: Take 10 mg by mouth daily. ) 90 tablet 3  . aspirin 81 MG tablet Take 81 mg by mouth daily.      . carvedilol (COREG) 12.5 MG tablet Take 1 tablet (12.5 mg total) by mouth 2 (two) times daily. 180 tablet 0  . cloNIDine (CATAPRES) 0.1  MG tablet TAKE 1 TABLET BY MOUTH  TWICE DAILY 180 tablet 0  . Cyanocobalamin (VITAMIN B 12 PO) Take 3,000 Units by mouth daily.    . famotidine (PEPCID) 20 MG tablet TAKE 1 TABLET BY MOUTH  TWICE DAILY 180 tablet 3  . hydrALAZINE (APRESOLINE) 25 MG tablet Take 1 tablet (25 mg total) by mouth 3 (three) times daily as needed (SBP > 150). 90 tablet 0  . levothyroxine (SYNTHROID) 100 MCG tablet TAKE 1 TABLET BY MOUTH  DAILY BEFORE BREAKFAST 90 tablet 3  . losartan (COZAAR) 100 MG tablet TAKE 1 TABLET BY MOUTH  DAILY 90 tablet 3  . nitroGLYCERIN (NITROSTAT) 0.4 MG SL  tablet Place 1 tablet (0.4 mg total) under the tongue every 5 (five) minutes as needed for chest pain. 25 tablet 1  . pravastatin (PRAVACHOL) 20 MG tablet TAKE 1 TABLET BY MOUTH AT  BEDTIME 90 tablet 3   No current facility-administered medications for this visit.      Allergies:   Bee pollen, Boniva [ibandronic acid], Pollen extract, and Motrin [ibuprofen]   Social History:  The patient  reports that she has never smoked. She has never used smokeless tobacco. She reports that she does not drink alcohol or use drugs.   Family History:   family history includes Heart attack in her sister; Heart attack (age of onset: 69) in her brother; Heart failure (age of onset: 66) in her sister; Heart failure (age of onset: 58) in her mother.    Review of Systems: Review of Systems  Constitutional: Negative.   Respiratory: Positive for shortness of breath.   Cardiovascular: Negative.   Gastrointestinal: Negative.   Musculoskeletal: Negative.   Neurological: Negative.   Psychiatric/Behavioral: Negative.   All other systems reviewed and are negative.   PHYSICAL EXAM: VS:  BP (!) 148/60 (BP Location: Left Arm, Patient Position: Sitting, Cuff Size: Normal)   Pulse (!) 52   Ht 5\' 1"  (1.549 m)   Wt 153 lb 8 oz (69.6 kg)   SpO2 98%   BMI 29.00 kg/m  , BMI Body mass index is 29 kg/m.  Constitutional:  oriented to person, place, and time. No distress.  HENT:  Head: Grossly normal Eyes:  no discharge. No scleral icterus.  Neck: No JVD, no carotid bruits  Cardiovascular: Regular rate and rhythm, no murmurs appreciated Pulmonary/Chest: Clear to auscultation bilaterally, no wheezes or rails Abdominal: Soft.  no distension.  no tenderness.  Musculoskeletal: Normal range of motion Neurological:  normal muscle tone. Coordination normal. No atrophy Skin: Skin warm and dry Psychiatric: normal affect, pleasant  Recent Labs: 07/29/2018: TSH 1.05 04/10/2019: ALT 12; BUN 24; Creatinine, Ser 1.13;  Hemoglobin 12.9; Platelets 168; Potassium 4.5; Sodium 133    Lipid Panel Lab Results  Component Value Date   CHOL 114 07/29/2018   HDL 54.60 07/29/2018   LDLCALC 45 07/29/2018   TRIG 68.0 07/29/2018      Wt Readings from Last 3 Encounters:  06/17/19 153 lb 8 oz (69.6 kg)  05/06/19 151 lb 12 oz (68.8 kg)  04/10/19 152 lb (68.9 kg)     ASSESSMENT AND PLAN:  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 No further workup at this time. Continue current medication regimen.  Essential hypertension  Will move the amlodipine to the PM Continue current meds Discussed bradycardia with her, no changes to the Coreg for now  Aortic valve stenosis, etiology of cardiac valve disease unspecified -  Mild aortic valve stenosis, echo reviewed  Carotid stenosis, right 40-59% stenosis on the right,  Less than 39% blockage on the left,  last evaluated 2020, results reviewed with her  Cholesterol at goal  Hyperlipidemia Cholesterol at goal, Results discussed with her, no medication changes made   Total encounter time more than 25 minutes  Greater than 50% was spent in counseling and coordination of care with the patient  Disposition:   F/U  12 months   Orders Placed This Encounter  Procedures  . EKG 12-Lead     Signed, Dossie Arbourim Carie Kapuscinski, M.D., Ph.D. 06/17/2019  [facility], [place] [phone]

## 2019-06-17 ENCOUNTER — Ambulatory Visit (INDEPENDENT_AMBULATORY_CARE_PROVIDER_SITE_OTHER): Payer: Medicare Other | Admitting: Cardiovascular Disease

## 2019-06-17 ENCOUNTER — Ambulatory Visit (INDEPENDENT_AMBULATORY_CARE_PROVIDER_SITE_OTHER): Payer: Medicare Other

## 2019-06-17 ENCOUNTER — Encounter: Payer: Self-pay | Admitting: Cardiovascular Disease

## 2019-06-17 VITALS — BP 148/60 | HR 52 | Ht 61.0 in | Wt 153.5 lb

## 2019-06-17 DIAGNOSIS — I519 Heart disease, unspecified: Secondary | ICD-10-CM | POA: Diagnosis not present

## 2019-06-17 DIAGNOSIS — I739 Peripheral vascular disease, unspecified: Secondary | ICD-10-CM | POA: Diagnosis not present

## 2019-06-17 DIAGNOSIS — I340 Nonrheumatic mitral (valve) insufficiency: Secondary | ICD-10-CM

## 2019-06-17 DIAGNOSIS — R55 Syncope and collapse: Secondary | ICD-10-CM

## 2019-06-17 DIAGNOSIS — I25118 Atherosclerotic heart disease of native coronary artery with other forms of angina pectoris: Secondary | ICD-10-CM

## 2019-06-17 DIAGNOSIS — K551 Chronic vascular disorders of intestine: Secondary | ICD-10-CM

## 2019-06-17 DIAGNOSIS — I6523 Occlusion and stenosis of bilateral carotid arteries: Secondary | ICD-10-CM | POA: Diagnosis not present

## 2019-06-17 DIAGNOSIS — I10 Essential (primary) hypertension: Secondary | ICD-10-CM

## 2019-06-17 DIAGNOSIS — I351 Nonrheumatic aortic (valve) insufficiency: Secondary | ICD-10-CM

## 2019-06-17 NOTE — Patient Instructions (Addendum)
Medication Instructions:  Please move the amlodipine to the evening  Keep an eye on the heart rate For rates in the 40s call the office, We may need to cut back on the coreg   If you need a refill on your cardiac medications before your next appointment, please call your pharmacy.    Lab work: No new labs needed   If you have labs (blood work) drawn today and your tests are completely normal, you will receive your results only by: [name] MyChart Message (if you have MyChart) OR . A paper copy in the mail If you have any lab test that is abnormal or we need to change your treatment, we will call you to review the results.   Testing/Procedures: No new testing needed   Follow-Up: At [facility], you and your health needs are our priority.  As part of our continuing mission to provide you with exceptional heart care, we have created designated Provider Care Teams.  These Care Teams include your primary Cardiologist (physician) and Advanced Practice Providers (APPs -  Physician Assistants and Nurse Practitioners) who all work together to provide you with the care you need, when you need it.  . You will need a follow up appointment in 6 months   . Providers on your designated Care Team:   . Murray Hodgkins, NP . Christell Faith, PA-C . Marrianne Mood, PA-C  Any Other Special Instructions Will Be Listed Below (If Applicable).  For educational health videos Log in to : [email or web address] Or : [email or web address], password : [email or web address]

## 2019-06-20 ENCOUNTER — Telehealth (INDEPENDENT_AMBULATORY_CARE_PROVIDER_SITE_OTHER): Payer: Self-pay | Admitting: Nurse Practitioner

## 2019-06-20 NOTE — Telephone Encounter (Signed)
I contacted Ms. Pretty by phone and spoke with her directly.  She was identified using 2 unique identifiers name and date of birth.  I discussed the patient's ultrasound results on 06/16/2019 with her.  We discussed the elevated velocities in her superior mesenteric artery and what this may indicate.  The patient is experiencing nausea which she states has been present for some time.  However, she denies any significant weight loss, postprandial nausea and vomiting or postprandial pain.  She states that the nausea just comes and goes at random times.  I discussed mesenteric artery stenosis and what this can mean as well as a worsening of symptoms.  We discussed an angiogram and what that would entail and why this may be beneficial for her at this time.  However, the patient did not wish to undergo a invasive procedure at this time.  I discussed with the patient signs and symptoms of worsening mesenteric stenosis such as vomiting, postprandial nausea and vomiting, intense abdominal pain, or unexplained weight loss.  If any of these happen she should contact her office sooner otherwise, we will have the patient return in 3 months for repeat studies.

## 2019-06-20 NOTE — Telephone Encounter (Signed)
-----   Message from Algernon Huxley, MD sent at 06/17/2019  4:27 PM EST ----- This was a lady who we had seen several months ago for renal and mesenteric disease.  Sounds like she may be having more symptoms and she came in for a mesenteric duplex this week.  Was on your schedule so did not see anybody.  She does have elevated velocities in the SMA and celiac so if her symptoms are worse she may be somebody that would benefit from proceeding with angiogram.  If you get a chance to call her this week or next week when you get back we can see if she wants to proceed with this.

## 2019-07-09 ENCOUNTER — Ambulatory Visit: Payer: Self-pay

## 2019-07-09 NOTE — Telephone Encounter (Signed)
Pt. Reports she has had a COVID 19 exposure. Asking about testing. Call dropped and an agent will call her back to help with an appointment.   Answer Assessment - Initial Assessment Questions 1. COVID-19 CLOSE CONTACT: "Who is the person with the confirmed or suspected COVID-19 infection that you were exposed to?"     A friend 2. PLACE of CONTACT: "Where were you when you were exposed to COVID-19?" (e.g., home, school, medical waiting room; which city?)     Home 3. TYPE of CONTACT: "How much contact was there?" (e.g., sitting next to, live in same house, work in same of, same building)     [place] 4. DURATION of CONTACT: "How long were you in contact with the COVID-19 patient?" (e.g., a few seconds, passed by person, a few minutes, 15 minutes or longer, live with the patient)     1 HOUR 5. MASK: "Were you wearing a mask?" "Was the other person wearing a mask?" Note: wearing a mask reduces the risk of an  otherwise close contact.     Yes 6. DATE of CONTACT: "When did you have contact with a COVID-19 patient?" (e.g., how many days ago)     Not direct contact 7. COMMUNITY SPREAD: "Are there lots of cases of COVID-19 (community spread) where you live?" (See public health department website, if unsure)       Yes 8. SYMPTOMS: "Do you have any symptoms?" (e.g., fever, cough, breathing difficulty, loss of taste or smell)     No 9. PREGNANCY OR POSTPARTUM: "Is there any chance you are pregnant?" "When was your last menstrual period?" "Did you deliver in the last 2 weeks?"     No 10. HIGH RISK: "Do you have any heart or lung problems? Do you have a weak immune system?" (e.g., heart failure, COPD, asthma, HIV positive, chemotherapy, renal failure, diabetes mellitus, sickle cell anemia, obesity)       HTN 11.  TRAVEL: "Have you traveled out of the country recently?" If so, "When and where?"  Also ask about out-of-state travel, since the CDC has identified some high-risk cities for community  spread in the [place].  Note: Travel becomes less relevant if there is widespread community transmission where the patient lives.       No  Protocols used: CORONAVIRUS (COVID-19) EXPOSURE-A-AH

## 2019-07-09 NOTE — Telephone Encounter (Signed)
fyi

## 2019-07-10 NOTE — Telephone Encounter (Signed)
I called and spoke with patient & she said that currently she was having no symptoms. She was tested on Monday & was awaiting her results. She was not directly exposed ut goes to exercise with a lady who's family member in the same hone has Covid. I asked that patient call back if she needed anything from [place] or started to experience symptoms. Patient was agreeable to this.

## 2019-07-10 NOTE — Telephone Encounter (Signed)
Would you circle back with patient ?  Did she get questions answered?  She can make an appt with [facility] if she would like ( don't have number in front of me but sure you can ask Gilmore Laroche etc)

## 2019-07-14 ENCOUNTER — Ambulatory Visit: Payer: Medicare Other | Attending: Internal Medicine

## 2019-07-14 DIAGNOSIS — Z20822 Contact with and (suspected) exposure to covid-19: Secondary | ICD-10-CM

## 2019-07-16 ENCOUNTER — Telehealth: Payer: Self-pay

## 2019-07-16 LAB — NOVEL CORONAVIRUS, NAA: SARS-CoV-2, NAA: NOT DETECTED

## 2019-07-16 NOTE — Telephone Encounter (Signed)
Pt notified of negative COVID-19 results. Understanding verbalized.  Chasta M Hopkins   

## 2019-09-01 ENCOUNTER — Other Ambulatory Visit: Payer: Self-pay | Admitting: Physician Assistant

## 2019-09-01 ENCOUNTER — Other Ambulatory Visit: Payer: Self-pay | Admitting: Family

## 2019-09-01 DIAGNOSIS — I10 Essential (primary) hypertension: Secondary | ICD-10-CM

## 2019-09-12 DIAGNOSIS — M1712 Unilateral primary osteoarthritis, left knee: Secondary | ICD-10-CM | POA: Diagnosis not present

## 2019-09-17 ENCOUNTER — Other Ambulatory Visit (INDEPENDENT_AMBULATORY_CARE_PROVIDER_SITE_OTHER): Payer: Self-pay | Admitting: Nurse Practitioner

## 2019-09-17 DIAGNOSIS — K551 Chronic vascular disorders of intestine: Secondary | ICD-10-CM

## 2019-09-19 ENCOUNTER — Ambulatory Visit (INDEPENDENT_AMBULATORY_CARE_PROVIDER_SITE_OTHER): Payer: Medicare Other | Admitting: Vascular Surgery

## 2019-09-19 ENCOUNTER — Encounter (INDEPENDENT_AMBULATORY_CARE_PROVIDER_SITE_OTHER): Payer: Self-pay | Admitting: Vascular Surgery

## 2019-09-19 ENCOUNTER — Ambulatory Visit (INDEPENDENT_AMBULATORY_CARE_PROVIDER_SITE_OTHER): Payer: Medicare Other

## 2019-09-19 ENCOUNTER — Other Ambulatory Visit: Payer: Self-pay

## 2019-09-19 VITALS — BP 182/79 | HR 59 | Resp 16 | Ht 61.0 in | Wt 150.0 lb

## 2019-09-19 DIAGNOSIS — K551 Chronic vascular disorders of intestine: Secondary | ICD-10-CM

## 2019-09-19 DIAGNOSIS — I10 Essential (primary) hypertension: Secondary | ICD-10-CM

## 2019-09-19 DIAGNOSIS — E782 Mixed hyperlipidemia: Secondary | ICD-10-CM

## 2019-09-19 NOTE — Assessment & Plan Note (Signed)
Her mesenteric duplex today shows SMA velocities still consistent with a 70-99% stenosis although the velocities do appear improved from her study a few months ago.  Celiac velocities were normal. Her symptoms are still not really consistent with chronic visceral ischemia and her duplex actually looks a little better today than it did a few months ago.  At this point, I think a follow-up in 6 months would be prudent and no intervention unless she develops clear symptoms of mesenteric ischemia.

## 2019-09-19 NOTE — Progress Notes (Signed)
MRN : [id number]  Kayla [name] is a 86 y.o. ([birth year]) female who presents with chief complaint of  Chief Complaint  Patient presents with  . Follow-up    U/S Follow up  .  History of Present Illness: Patient returns today in follow up of her SMA stenosis.  She continues to have a weight right around 150 pounds without significant loss or gain.  She eats well and does not really have abdominal pain after eating.  She does have a fair bit of nausea and some intermittent abdominal pain unrelated to eating.  Her mesenteric duplex today shows SMA velocities still consistent with a 70-99% stenosis although the velocities do appear improved from her study a few months ago.  Celiac velocities were normal.  Current Outpatient Medications  Medication Sig Dispense Refill  . amLODipine (NORVASC) 10 MG tablet TAKE 1 TABLET BY MOUTH  DAILY (Patient taking differently: Take 10 mg by mouth daily. ) 90 tablet 3  . aspirin 81 MG tablet Take 81 mg by mouth daily.      . carvedilol (COREG) 12.5 MG tablet TAKE 1 TABLET BY MOUTH  TWICE DAILY 180 tablet 3  . cloNIDine (CATAPRES) 0.1 MG tablet TAKE 1 TABLET BY MOUTH  TWICE DAILY 180 tablet 3  . famotidine (PEPCID) 20 MG tablet TAKE 1 TABLET BY MOUTH  TWICE DAILY 180 tablet 3  . levothyroxine (SYNTHROID) 100 MCG tablet TAKE 1 TABLET BY MOUTH  DAILY BEFORE BREAKFAST 90 tablet 3  . losartan (COZAAR) 100 MG tablet TAKE 1 TABLET BY MOUTH  DAILY 90 tablet 3  . nitroGLYCERIN (NITROSTAT) 0.4 MG SL tablet Place 1 tablet (0.4 mg total) under the tongue every 5 (five) minutes as needed for chest pain. 25 tablet 1  . pravastatin (PRAVACHOL) 20 MG tablet TAKE 1 TABLET BY MOUTH AT  BEDTIME 90 tablet 3  . Cyanocobalamin (VITAMIN B 12 PO) Take 3,000 Units by mouth daily.    . hydrALAZINE (APRESOLINE) 25 MG tablet Take 1 tablet (25 mg total) by mouth 3 (three) times daily as needed (SBP > 150). 90 tablet 0   No current facility-administered medications for this visit.     Past Medical History:  Diagnosis Date  . Anginal pain (HCC)   . Arthritis   . Bundle branch block, left    Chronic  . CAD (coronary artery disease)   . Cholecystitis    from record  . Emphysema lung (HCC)   . GERD (gastroesophageal reflux disease)   . Heart murmur   . HOH (hard of hearing)   . Hyperlipidemia   . Hypertension   . Hypothyroidism   . Orthostatic headache    Mild  . Pain    HIP AND LEG PAIN USES  CANE  . Vertigo     Past Surgical History:  Procedure Laterality Date  . APPENDECTOMY  1957  . BREAST BIOPSY Left 2011   neg, stereo done by Dr. Evette Cristal  . BREAST BIOPSY    . CARDIAC CATHETERIZATION  2000   [facility]  . CATARACT EXTRACTION Right    right 2004, left 2018   . CATARACT EXTRACTION W/PHACO Left 04/12/2017   Procedure: CATARACT EXTRACTION PHACO AND INTRAOCULAR LENS PLACEMENT (IOC);  Surgeon: Lockie Mola, MD;  Location: [facility] ORS;  Service: Ophthalmology;  Laterality: Left;  [place] 01:09.0AP% 23.7CDE 16.35FLUID PACK LOT # [id number] H  . CHOLECYSTECTOMY N/A 04/25/2016   Procedure: LAPAROSCOPIC CHOLECYSTECTOMY WITH INTRAOPERATIVE CHOLANGIOGRAM;  Surgeon: Kieth Brightly, MD;  Location: [facility]  ORS;  Service: General;  Laterality: N/A;  . CORONARY ANGIOPLASTY     STENT  . CORONARY ARTERY BYPASS GRAFT  2001  . KNEE SURGERY    . thyroid  1970  . UPPER GI ENDOSCOPY  2014     Social History   Tobacco Use  . Smoking status: Never Smoker  . Smokeless tobacco: Never Used  Substance Use Topics  . Alcohol use: No  . Drug use: No    Family History  Problem Relation Age of Onset  . Heart failure Mother 4  . Heart failure Sister 14  . Heart attack Sister   . Heart attack Brother 42     Allergies  Allergen Reactions  . Bee Pollen     Other reaction(s): Other (See Comments)  . Boniva [Ibandronic Acid]     Jaw pain  . Pollen Extract   . Motrin [Ibuprofen] Rash     REVIEW OF SYSTEMS (Negative unless checked)  Constitutional: [] Weight loss   [] Fever  [] Chills Cardiac: [] Chest pain   [] Chest pressure   [] Palpitations   [] Shortness of breath when laying flat   [] Shortness of breath at rest   [] Shortness of breath with exertion. Vascular:  [] Pain in legs with walking   [] Pain in legs at rest   [] Pain in legs when laying flat   [] Claudication   [] Pain in feet when walking  [] Pain in feet at rest  [] Pain in feet when laying flat   [] History of DVT   [] Phlebitis   [] Swelling in legs   [] Varicose veins   [] Non-healing ulcers Pulmonary:   [] Uses home oxygen   [] Productive cough   [] Hemoptysis   [] Wheeze  [] COPD   [] Asthma Neurologic:  [] Dizziness  [] Blackouts   [] Seizures   [] History of stroke   [] History of TIA  [] Aphasia   [] Temporary blindness   [] Dysphagia   [] Weakness or numbness in arms   [] Weakness or numbness in legs Musculoskeletal:  [x] Arthritis   [] Joint swelling   [] Joint pain   [] Low back pain Hematologic:  [] Easy bruising  [] Easy bleeding   [] Hypercoagulable state   [] Anemic   Gastrointestinal:  [] Blood in stool   [] Vomiting blood  [] Gastroesophageal reflux/heartburn   [x] Abdominal pain Genitourinary:  [] Chronic kidney disease   [] Difficult urination  [] Frequent urination  [] Burning with urination   [] Hematuria Skin:  [] Rashes   [] Ulcers   [] Wounds Psychological:  [] History of anxiety   []  History of major depression.  Physical Examination  BP (!) 182/79   Pulse (!) 59   Resp 16   Ht 5\' 1"  (1.549 m)   Wt 150 lb (68 kg)   BMI 28.34 kg/m  Gen:  WD/WN, NAD.  Appears far younger than stated age Head: [employer]/AT, No temporalis wasting. Ear/Nose/Throat: Hearing grossly intact, nares w/o erythema or drainage Eyes: Conjunctiva clear. Sclera non-icteric Neck: Supple.  Trachea midline Pulmonary:  Good air movement, no use of accessory muscles.  Cardiac: RRR, no JVD Vascular:  Vessel Right Left  Radial Palpable Palpable                                   Gastrointestinal: soft, non-tender/non-distended. No guarding/reflex.   Musculoskeletal: M/S 5/5 throughout.  No deformity or atrophy.  Trace lower extremity edema.  Walks with a cane Neurologic: Sensation grossly intact in extremities.  Symmetrical.  Speech is fluent.  Psychiatric: Judgment intact, Mood & affect appropriate for pt's clinical  situation. Dermatologic: No rashes or ulcers noted.  No cellulitis or open wounds.       Labs Recent Results (from the past 2160 hour(s))  Novel Coronavirus, NAA ([facility])     Status: None   Collection Time: 07/14/19  9:58 AM   Specimen: Nasopharyngeal(NP) swabs in vial transport medium   NASOPHARYNGE  TESTING  Result Value Ref Range   SARS-CoV-2, NAA Not Detected Not Detected    Comment: This nucleic acid amplification test was developed and its performance characteristics determined by [employer]. Nucleic acid amplification tests include PCR and TMA. This test has not been FDA cleared or approved. This test has been authorized by FDA under an Emergency Use Authorization (EUA). This test is only authorized for the duration of time the declaration that circumstances exist justifying the authorization of the emergency use of in vitro diagnostic tests for detection of SARS-CoV-2 virus and/or diagnosis of COVID-19 infection under section 564(b)(1) of the Act, 21 U.S.C. 962IWL-7(L) (1), unless the authorization is terminated or revoked sooner. When diagnostic testing is negative, the possibility of a false negative result should be considered in the context of a patient's recent exposures and the presence of clinical signs and symptoms consistent with COVID-19. An individual without symptoms of COVID-19 and who is not shedding SARS-CoV-2 virus would  expect to have a negative (not detected) result in this assay.     Radiology No results found.  Assessment/Plan Essential hypertension blood pressure control important in reducing the progression of atherosclerotic disease. On appropriate oral  medications.   Mixed hyperlipidemia lipid control important in reducing the progression of atherosclerotic disease. Continue statin therapy  Superior mesenteric artery stenosis (HCC) Her mesenteric duplex today shows SMA velocities still consistent with a 70-99% stenosis although the velocities do appear improved from her study a few months ago.  Celiac velocities were normal. Her symptoms are still not really consistent with chronic visceral ischemia and her duplex actually looks a little better today than it did a few months ago.  At this point, I think a follow-up in 6 months would be prudent and no intervention unless she develops clear symptoms of mesenteric ischemia.    Festus Barren, MD  09/19/2019 12:00 PM    This note was created with Dragon medical transcription system.  Any errors from dictation are purely unintentional

## 2019-09-29 ENCOUNTER — Other Ambulatory Visit: Payer: Self-pay | Admitting: Physician Assistant

## 2019-10-20 ENCOUNTER — Telehealth (INDEPENDENT_AMBULATORY_CARE_PROVIDER_SITE_OTHER): Payer: Medicare Other | Admitting: Family

## 2019-10-20 ENCOUNTER — Encounter: Payer: Self-pay | Admitting: Family

## 2019-10-20 ENCOUNTER — Telehealth: Payer: Self-pay | Admitting: Family

## 2019-10-20 VITALS — Ht 61.0 in | Wt 150.0 lb

## 2019-10-20 DIAGNOSIS — R509 Fever, unspecified: Secondary | ICD-10-CM | POA: Diagnosis not present

## 2019-10-20 NOTE — Telephone Encounter (Signed)
I spoke with patient & advised her that the UC across from [place] also did Covid testing. I told her it was walk-in & no appointment was necessary. Pt did not want to go to [facility] & said that she did not feel like going anywhere today. She said that most likely she would go to [facility] tomorrow. I asked if she would please call & update [place] either way.

## 2019-10-20 NOTE — Progress Notes (Signed)
See note

## 2019-10-20 NOTE — Assessment & Plan Note (Signed)
Low-grade fever.  She feels well when speaking over the phone.   One episode of emesis, now resolved.  Discussed with patient that I do not have an etiology for this for her low grade fever.  Concern for early infection, pending labs chest x-ray, urinalysis.  Also discussed with patient possibility of emesis related to acid reflux as  she had been symptomatic the night prior.  Patient will remain  incredibly vigilant.  She will have COVID-19 test done tomorrow at urgent care as unfortunately first available through [facility] is in 4 days.  pending labs, chest x-ray done at medical mall at [facility].  She will let me know how she is doing

## 2019-10-20 NOTE — Telephone Encounter (Signed)
Pt called and said she can't get covid test until Thurs and would like a call back. She was Margaret's 12pm patient

## 2019-10-20 NOTE — Patient Instructions (Addendum)
Verbal consent for services obtained from patient prior to services given to TELEPHONE visit:   Location of call:  provider at work patient at home  Names of all persons present for services: Rennie Plowman, NP and patient Chief complaint:  Acute visit  Woke to use the bathroom around 330 and decided to take synthroid at that time. Takes the synthroid first so she can take pepcid at 6am. She didn't take the pepcid this morning however was burping last night and took 4 tums, with relief. One episode of emesis at 4am this morning, non bloody, bile. Then started to have chills at that time. Feels well now. Symptoms resolved. Endorses feeling warm however house is 'warm'.  Eating chicken noodle soup now, that's only food she has had. Hasnt been drinking fluid. No HA, vision changes, coughing, sob, cp, epigastric pain, dysuria, fever.  No once else is sick at home.   HTN- hasnt taken blood pressure medications today.  Usually 138/60 at home.   History, background, results pertinent:  Live with daughter. Daughter is home.   A/P/next steps: 147/68, HR 75. T 100 taken with temporal thermometer.   Problem List Items Addressed This Visit      Other   Low grade fever - Primary    Low-grade fever.  She feels well when speaking over the phone.   One episode of emesis, now resolved.  Discussed with patient that I do not have an etiology for this for her low grade fever.  Concern for early infection, pending labs chest x-ray, urinalysis.  Also discussed with patient possibility of emesis related to acid reflux as  she had been symptomatic the night prior.  Patient will remain  incredibly vigilant.  She will have COVID-19 test done tomorrow at urgent care as unfortunately first available through [facility] is in 4 days.  pending labs, chest x-ray done at medical mall at [facility].  She will let me know how she is doing      Relevant Orders   CBC with Differential/Platelet   Comprehensive metabolic panel   DG  Chest 2 View   Urinalysis, Routine w reflex microscopic   Urine Culture       I spent 20 min  discussing plan of care over the phone.

## 2019-10-21 ENCOUNTER — Ambulatory Visit
Admission: RE | Admit: 2019-10-21 | Discharge: 2019-10-21 | Disposition: A | Discharge status: Home / Self Care | Payer: Medicare Other | Source: Ambulatory Visit | Attending: Family | Admitting: Family

## 2019-10-21 ENCOUNTER — Other Ambulatory Visit
Admission: RE | Admit: 2019-10-21 | Discharge: 2019-10-21 | Disposition: A | Discharge status: Home / Self Care | Payer: Medicare Other | Source: Ambulatory Visit | Attending: Family | Admitting: Family

## 2019-10-21 ENCOUNTER — Ambulatory Visit
Admission: EM | Admit: 2019-10-21 | Discharge: 2019-10-21 | Disposition: A | Discharge status: Home / Self Care | Payer: Medicare Other | Attending: Emergency Medicine | Admitting: Emergency Medicine

## 2019-10-21 ENCOUNTER — Telehealth: Payer: Self-pay | Admitting: Family

## 2019-10-21 ENCOUNTER — Other Ambulatory Visit: Payer: Self-pay

## 2019-10-21 ENCOUNTER — Other Ambulatory Visit: Payer: Self-pay | Admitting: *Deleted

## 2019-10-21 ENCOUNTER — Other Ambulatory Visit: Payer: Self-pay | Admitting: Family

## 2019-10-21 DIAGNOSIS — R509 Fever, unspecified: Secondary | ICD-10-CM | POA: Insufficient documentation

## 2019-10-21 DIAGNOSIS — R899 Unspecified abnormal finding in specimens from other organs, systems and tissues: Secondary | ICD-10-CM | POA: Diagnosis not present

## 2019-10-21 DIAGNOSIS — Z0189 Encounter for other specified special examinations: Secondary | ICD-10-CM | POA: Diagnosis not present

## 2019-10-21 LAB — URINALYSIS, ROUTINE W REFLEX MICROSCOPIC
Bacteria, UA: NONE SEEN
Bilirubin Urine: NEGATIVE
Glucose, UA: NEGATIVE mg/dL
Hgb urine dipstick: NEGATIVE
Ketones, ur: NEGATIVE mg/dL
Nitrite: NEGATIVE
Protein, ur: NEGATIVE mg/dL
Specific Gravity, Urine: 1.009 (ref 1.005–1.030)
pH: 5 (ref 5.0–8.0)

## 2019-10-21 LAB — HEPATIC FUNCTION PANEL
ALT: 11 U/L (ref 0–44)
AST: 15 U/L (ref 15–41)
Albumin: 3.9 g/dL (ref 3.5–5.0)
Alkaline Phosphatase: 42 U/L (ref 38–126)
Bilirubin, Direct: 0.2 mg/dL (ref 0.0–0.2)
Indirect Bilirubin: 0.9 mg/dL (ref 0.3–0.9)
Total Bilirubin: 1.1 mg/dL (ref 0.3–1.2)
Total Protein: 6.8 g/dL (ref 6.5–8.1)

## 2019-10-21 LAB — CBC WITH DIFFERENTIAL/PLATELET
Abs Immature Granulocytes: 0.05 10*3/uL (ref 0.00–0.07)
Basophils Absolute: 0.1 10*3/uL (ref 0.0–0.1)
Basophils Relative: 1 %
Eosinophils Absolute: 0.2 10*3/uL (ref 0.0–0.5)
Eosinophils Relative: 3 %
HCT: 36.8 % (ref 36.0–46.0)
Hemoglobin: 12.5 g/dL (ref 12.0–15.0)
Immature Granulocytes: 1 %
Lymphocytes Relative: 17 %
Lymphs Abs: 1.5 10*3/uL (ref 0.7–4.0)
MCH: 31.7 pg (ref 26.0–34.0)
MCHC: 34 g/dL (ref 30.0–36.0)
MCV: 93.4 fL (ref 80.0–100.0)
Monocytes Absolute: 0.7 10*3/uL (ref 0.1–1.0)
Monocytes Relative: 8 %
Neutro Abs: 6.3 10*3/uL (ref 1.7–7.7)
Neutrophils Relative %: 70 %
Platelets: 183 10*3/uL (ref 150–400)
RBC: 3.94 MIL/uL (ref 3.87–5.11)
RDW: 13.4 % (ref 11.5–15.5)
WBC: 8.7 10*3/uL (ref 4.0–10.5)
nRBC: 0 % (ref 0.0–0.2)

## 2019-10-21 LAB — COMPREHENSIVE METABOLIC PANEL
ALT: 12 U/L (ref 0–44)
AST: 15 U/L (ref 15–41)
Albumin: 3.9 g/dL (ref 3.5–5.0)
Alkaline Phosphatase: 42 U/L (ref 38–126)
Anion gap: 9 (ref 5–15)
BUN: 19 mg/dL (ref 8–23)
CO2: 26 mmol/L (ref 22–32)
Calcium: 9.4 mg/dL (ref 8.9–10.3)
Chloride: 100 mmol/L (ref 98–111)
Creatinine, Ser: 0.93 mg/dL (ref 0.44–1.00)
GFR calc Af Amer: >60 mL/min (ref >60)
GFR calc non Af Amer: 54 mL/min — ABNORMAL LOW (ref >60)
Glucose, Bld: 126 mg/dL — ABNORMAL HIGH (ref 70–99)
Potassium: 4.2 mmol/L (ref 3.5–5.1)
Sodium: 135 mmol/L (ref 135–145)
Total Bilirubin: 1.3 mg/dL — ABNORMAL HIGH (ref 0.3–1.2)
Total Protein: 6.8 g/dL (ref 6.5–8.1)

## 2019-10-21 NOTE — Addendum Note (Signed)
Addended by: Warden Fillers on: 10/21/2019 01:47 PM   Modules accepted: Orders

## 2019-10-21 NOTE — ED Provider Notes (Signed)
Kayla [name]    CSN: [id number] Arrival date & time: 10/21/19  1031      History   Chief Complaint Chief Complaint  Patient presents with  . Fever    HPI Kayla [name] is a 86 y.o. female.  Accompanied by her daughter, patient presents with request for COVID test.  She states she had a fever, chills, emesis (1 episode), and diarrhea (2-3 episodes) yesterday.  Tmax 100.8.  Today she states she feels fine; no vomiting or diarrhea.  Her PCP ordered labs and CXR yesterday; patient going to get these done today.  No treatments attempted at home.    The history is provided by the patient.    Past Medical History:  Diagnosis Date  . Anginal pain (HCC)   . Arthritis   . Bundle branch block, left    Chronic  . CAD (coronary artery disease)   . Cholecystitis    from record  . Emphysema lung (HCC)   . GERD (gastroesophageal reflux disease)   . Heart murmur   . HOH (hard of hearing)   . Hyperlipidemia   . Hypertension   . Hypothyroidism   . Orthostatic headache    Mild  . Pain    HIP AND LEG PAIN USES  CANE  . Vertigo     Patient Active Problem List   Diagnosis Date Noted  . Low grade fever 10/20/2019    Past Surgical History:  Procedure Laterality Date  . APPENDECTOMY  1957  . BREAST BIOPSY Left 2011   neg, stereo done by Dr. Evette Cristal  . BREAST BIOPSY    . CARDIAC CATHETERIZATION  2000   [facility]  . CATARACT EXTRACTION Right    right 2004, left 2018   . CATARACT EXTRACTION W/PHACO Left 04/12/2017   Procedure: CATARACT EXTRACTION PHACO AND INTRAOCULAR LENS PLACEMENT (IOC);  Surgeon: Lockie Mola, MD;  Location: [facility] ORS;  Service: Ophthalmology;  Laterality: Left;  [place] 01:09.0AP% 23.7CDE 16.35FLUID PACK LOT # [id number] H  . CHOLECYSTECTOMY N/A 04/25/2016   Procedure: LAPAROSCOPIC CHOLECYSTECTOMY WITH INTRAOPERATIVE CHOLANGIOGRAM;  Surgeon: Kieth Brightly, MD;  Location: [facility] ORS;  Service: General;  Laterality: N/A;  . CORONARY ANGIOPLASTY     STENT    . CORONARY ARTERY BYPASS GRAFT  2001  . KNEE SURGERY    . thyroid  1970  . UPPER GI ENDOSCOPY  2014    OB History    Gravida  7   Para  6   Term      Preterm      AB  1   Living        SAB      TAB      Ectopic      Multiple      Live Births           Obstetric Comments  1st Menstrual Cycle: 16 1st Pregnancy:  19          Home Medications    Prior to Admission medications   Medication Sig Start Date End Date Taking? Authorizing Provider  amLODipine (NORVASC) 10 MG tablet TAKE 1 TABLET BY MOUTH  DAILY Patient taking differently: Take 10 mg by mouth daily.  04/02/19   Allegra Grana, FNP  aspirin 81 MG tablet Take 81 mg by mouth daily.      [provider]  carvedilol (COREG) 12.5 MG tablet TAKE 1 TABLET BY MOUTH  TWICE DAILY 09/01/19   Allegra Grana, FNP  cloNIDine (CATAPRES)  0.1 MG tablet TAKE 1 TABLET BY MOUTH  TWICE DAILY 09/01/19   Minna Merritts, MD  Cyanocobalamin (VITAMIN B 12 PO) Take 3,000 Units by mouth daily.    [provider]  famotidine (PEPCID) 20 MG tablet TAKE 1 TABLET BY MOUTH  TWICE DAILY 04/02/19   Burnard Hawthorne, FNP  hydrALAZINE (APRESOLINE) 25 MG tablet TAKE 1 TABLET BY MOUTH 3  TIMES DAILY AS NEEDED (SBP  ABOVE 150) 09/30/19   Gollan, Kathlene November, MD  levothyroxine (SYNTHROID) 100 MCG tablet TAKE 1 TABLET BY MOUTH  DAILY BEFORE BREAKFAST 04/02/19   Burnard Hawthorne, FNP  losartan (COZAAR) 100 MG tablet TAKE 1 TABLET BY MOUTH  DAILY 02/27/19   Burnard Hawthorne, FNP  nitroGLYCERIN (NITROSTAT) 0.4 MG SL tablet Place 1 tablet (0.4 mg total) under the tongue every 5 (five) minutes as needed for chest pain. 03/21/19   Burnard Hawthorne, FNP  pravastatin (PRAVACHOL) 20 MG tablet TAKE 1 TABLET BY MOUTH AT  BEDTIME 05/20/19   Burnard Hawthorne, FNP    Family History Family History  Problem Relation Age of Onset  . Heart failure Mother 37  . Heart failure Sister 75  . Heart attack Sister   . Heart attack  Brother 66    Social History Social History   Tobacco Use  . Smoking status: Never Smoker  . Smokeless tobacco: Never Used  Substance Use Topics  . Alcohol use: No  . Drug use: No     Allergies   Bee pollen, Boniva [ibandronic acid], Pollen extract, and Motrin [ibuprofen]   Review of Systems Review of Systems  Constitutional: Positive for chills and fever.  HENT: Negative for ear pain and sore throat.   Eyes: Negative for pain and visual disturbance.  Respiratory: Negative for cough and shortness of breath.   Cardiovascular: Negative for chest pain and palpitations.  Gastrointestinal: Positive for diarrhea and vomiting. Negative for abdominal pain.  Genitourinary: Negative for dysuria and hematuria.  Musculoskeletal: Negative for arthralgias and back pain.  Skin: Negative for color change and rash.  Neurological: Negative for seizures and syncope.  All other systems reviewed and are negative.    Physical Exam Triage Vital Signs ED Triage Vitals  Enc Vitals Group     BP 10/21/19 1039 (!) 155/70     Pulse Rate 10/21/19 1039 65     Resp 10/21/19 1039 19     Temp 10/21/19 1039 97.8 F (36.6 C)     Temp Source 10/21/19 1039 Oral     SpO2 10/21/19 1039 98 %     Weight 10/21/19 1035 151 lb (68.5 kg)     Height --      Head Circumference --      Peak Flow --      Pain Score 10/21/19 1035 0     Pain Loc --      Pain Edu? --      Excl. in [place]? --    No data found.  Updated Vital Signs BP (!) 155/70 (BP Location: Left Arm)   Pulse 65   Temp 97.8 F (36.6 C) (Oral)   Resp 19   Wt 151 lb (68.5 kg)   SpO2 98%   BMI 28.53 kg/m   Visual Acuity Right Eye Distance:   Left Eye Distance:   Bilateral Distance:    Right Eye Near:   Left Eye Near:    Bilateral Near:     Physical Exam Vitals and nursing note  reviewed.  Constitutional:      General: She is not in acute distress.    Appearance: She is well-developed. She is not ill-appearing.  HENT:     Head:  Normocephalic and atraumatic.     Mouth/Throat:     Mouth: Mucous membranes are moist.     Pharynx: Oropharynx is clear.  Eyes:     Conjunctiva/sclera: Conjunctivae normal.  Cardiovascular:     Rate and Rhythm: Normal rate and regular rhythm.     Heart sounds: No murmur.  Pulmonary:     Effort: Pulmonary effort is normal. No respiratory distress.     Breath sounds: Normal breath sounds. No wheezing or rhonchi.  Abdominal:     General: Bowel sounds are normal.     Palpations: Abdomen is soft.     Tenderness: There is no abdominal tenderness. There is no guarding or rebound.  Musculoskeletal:     Cervical back: Neck supple.  Skin:    General: Skin is warm and dry.     Findings: No rash.  Neurological:     General: No focal deficit present.     Mental Status: She is alert. Mental status is at baseline.     Comments: Ambulatory with cane.   Psychiatric:        Mood and Affect: Mood normal.        Behavior: Behavior normal.      UC Treatments / Results  Labs (all labs ordered are listed, but only abnormal results are displayed) Labs Reviewed  NOVEL CORONAVIRUS, NAA    EKG   Radiology No results found.  Procedures Procedures (including critical care time)  Medications Ordered in UC Medications - No data to display  Initial Impression / Assessment and Plan / UC Course  I have reviewed the triage vital signs and the nursing notes.  Pertinent labs & imaging results that were available during my care of the patient were reviewed by me and considered in my medical decision making (see chart for details).   Patient request for COVID test.  Currently asymptomatic.  COVID test performed here.  Instructed patient to self quarantine until the test result is back.  Discussed with patient that she can take Tylenol as needed for fever or discomfort.  Instructed patient to go to the emergency department if she develops high fever, shortness of breath, severe diarrhea, or other  concerning symptoms.  Patient agrees with plan of care.     Final Clinical Impressions(s) / UC Diagnoses   Final diagnoses:  Patient request for diagnostic testing     Discharge Instructions     Your COVID test is pending.  You should self quarantine until the test result is back.    Take Tylenol as needed for fever or discomfort.  Rest and keep yourself hydrated.    Go to the emergency department if you develop shortness of breath, severe diarrhea, high fever not relieved by Tylenol or ibuprofen, or other concerning symptoms.       ED Prescriptions    None     PDMP not reviewed this encounter.   Mickie Bail, NP 10/21/19 1105

## 2019-10-21 NOTE — Telephone Encounter (Signed)
Pt's daughter [name]) [phone] called and wanted to know if PCP made appt at [facility] for chest xray and labs? Please call back to advise.

## 2019-10-21 NOTE — ED Triage Notes (Signed)
Pt had a fever of 100.8, chills, vomiting that she thinks came from her meds, this happened yesterday. States she feels fine today, she has a hx of GERD.

## 2019-10-21 NOTE — Addendum Note (Signed)
Addended by: Deloria Lair on: 10/21/2019 12:14 PM   Modules accepted: Orders

## 2019-10-21 NOTE — Telephone Encounter (Signed)
err

## 2019-10-21 NOTE — Discharge Instructions (Addendum)
Your COVID test is pending.  You should self quarantine until the test result is back.    Take Tylenol as needed for fever or discomfort.  Rest and keep yourself hydrated.    Go to the emergency department if you develop shortness of breath, severe diarrhea, high fever not relieved by Tylenol or ibuprofen, or other concerning symptoms.    

## 2019-10-21 NOTE — Telephone Encounter (Signed)
I called and spoke with [name] patient's daughter. I let her know that since everything was ordered for [facility] that no appointment was necessary & that they could go when ready.

## 2019-10-22 LAB — URINE CULTURE

## 2019-10-23 ENCOUNTER — Other Ambulatory Visit: Payer: Self-pay

## 2019-10-23 LAB — NOVEL CORONAVIRUS, NAA: SARS-CoV-2, NAA: NOT DETECTED

## 2019-10-23 LAB — SARS-COV-2, NAA 2 DAY TAT

## 2019-10-24 DIAGNOSIS — M170 Bilateral primary osteoarthritis of knee: Secondary | ICD-10-CM | POA: Diagnosis not present

## 2019-10-24 DIAGNOSIS — M179 Osteoarthritis of knee, unspecified: Secondary | ICD-10-CM | POA: Insufficient documentation

## 2019-11-10 ENCOUNTER — Other Ambulatory Visit: Payer: Self-pay

## 2019-11-10 ENCOUNTER — Ambulatory Visit (INDEPENDENT_AMBULATORY_CARE_PROVIDER_SITE_OTHER): Payer: Medicare Other

## 2019-11-10 DIAGNOSIS — I6523 Occlusion and stenosis of bilateral carotid arteries: Secondary | ICD-10-CM | POA: Diagnosis not present

## 2019-11-14 ENCOUNTER — Other Ambulatory Visit: Payer: Self-pay

## 2019-11-14 ENCOUNTER — Telehealth: Payer: Self-pay | Admitting: Cardiovascular Disease

## 2019-11-14 ENCOUNTER — Emergency Department
Admission: EM | Admit: 2019-11-14 | Discharge: 2019-11-14 | Disposition: A | Discharge status: Home / Self Care | Payer: Medicare Other | Attending: Emergency Medicine | Admitting: Emergency Medicine

## 2019-11-14 DIAGNOSIS — I2510 Atherosclerotic heart disease of native coronary artery without angina pectoris: Secondary | ICD-10-CM | POA: Diagnosis not present

## 2019-11-14 DIAGNOSIS — I10 Essential (primary) hypertension: Secondary | ICD-10-CM | POA: Diagnosis not present

## 2019-11-14 DIAGNOSIS — Z7982 Long term (current) use of aspirin: Secondary | ICD-10-CM | POA: Diagnosis not present

## 2019-11-14 DIAGNOSIS — E039 Hypothyroidism, unspecified: Secondary | ICD-10-CM | POA: Diagnosis not present

## 2019-11-14 DIAGNOSIS — R42 Dizziness and giddiness: Secondary | ICD-10-CM | POA: Diagnosis not present

## 2019-11-14 DIAGNOSIS — R55 Syncope and collapse: Secondary | ICD-10-CM | POA: Diagnosis not present

## 2019-11-14 DIAGNOSIS — Z79899 Other long term (current) drug therapy: Secondary | ICD-10-CM | POA: Diagnosis not present

## 2019-11-14 DIAGNOSIS — I447 Left bundle-branch block, unspecified: Secondary | ICD-10-CM | POA: Diagnosis not present

## 2019-11-14 DIAGNOSIS — I4891 Unspecified atrial fibrillation: Secondary | ICD-10-CM | POA: Insufficient documentation

## 2019-11-14 DIAGNOSIS — R0602 Shortness of breath: Secondary | ICD-10-CM | POA: Insufficient documentation

## 2019-11-14 DIAGNOSIS — E860 Dehydration: Secondary | ICD-10-CM | POA: Insufficient documentation

## 2019-11-14 DIAGNOSIS — Z743 Need for continuous supervision: Secondary | ICD-10-CM | POA: Diagnosis not present

## 2019-11-14 LAB — CBC
HCT: 41.6 % (ref 36.0–46.0)
Hemoglobin: 14 g/dL (ref 12.0–15.0)
MCH: 31.5 pg (ref 26.0–34.0)
MCHC: 33.7 g/dL (ref 30.0–36.0)
MCV: 93.5 fL (ref 80.0–100.0)
Platelets: 170 10*3/uL (ref 150–400)
RBC: 4.45 MIL/uL (ref 3.87–5.11)
RDW: 13.8 % (ref 11.5–15.5)
WBC: 8.4 10*3/uL (ref 4.0–10.5)
nRBC: 0 % (ref 0.0–0.2)

## 2019-11-14 LAB — URINALYSIS, COMPLETE (UACMP) WITH MICROSCOPIC
Bacteria, UA: NONE SEEN
Bilirubin Urine: NEGATIVE
Glucose, UA: NEGATIVE mg/dL
Hgb urine dipstick: NEGATIVE
Ketones, ur: NEGATIVE mg/dL
Leukocytes,Ua: NEGATIVE
Nitrite: NEGATIVE
Protein, ur: NEGATIVE mg/dL
Specific Gravity, Urine: 1.01 (ref 1.005–1.030)
pH: 6 (ref 5.0–8.0)

## 2019-11-14 LAB — BASIC METABOLIC PANEL
Anion gap: 11 (ref 5–15)
BUN: 21 mg/dL (ref 8–23)
CO2: 24 mmol/L (ref 22–32)
Calcium: 9.3 mg/dL (ref 8.9–10.3)
Chloride: 96 mmol/L — ABNORMAL LOW (ref 98–111)
Creatinine, Ser: 0.97 mg/dL (ref 0.44–1.00)
GFR calc Af Amer: 60 mL/min — ABNORMAL LOW (ref >60)
GFR calc non Af Amer: 51 mL/min — ABNORMAL LOW (ref >60)
Glucose, Bld: 122 mg/dL — ABNORMAL HIGH (ref 70–99)
Potassium: 4.6 mmol/L (ref 3.5–5.1)
Sodium: 131 mmol/L — ABNORMAL LOW (ref 135–145)

## 2019-11-14 LAB — TROPONIN I (HIGH SENSITIVITY): Troponin I (High Sensitivity): 6 ng/L (ref <18)

## 2019-11-14 LAB — GLUCOSE, CAPILLARY: Glucose-Capillary: 110 mg/dL — ABNORMAL HIGH (ref 70–99)

## 2019-11-14 MED ORDER — APIXABAN 5 MG PO TABS: 5.0000 mg | ORAL_TABLET | Freq: Two times a day (BID) | ORAL | 0 refills | Status: DC

## 2019-11-14 MED ORDER — SODIUM CHLORIDE 0.9 % IV BOLUS
500.0000 mL | Freq: Once | INTRAVENOUS | Status: AC
  Administered 2019-11-14: 14:00:00 500 mL via INTRAVENOUS

## 2019-11-14 NOTE — ED Triage Notes (Signed)
Reports dizziness with standing and lower legs are "asleep" when standing. Hx of arthritis to both legs and recently had "shots" to legs. Denies dizziness currently. No fall. Pt alert and oriented X4, cooperative, RR even and unlabored, color WNL. Pt in NAD.

## 2019-11-14 NOTE — ED Provider Notes (Addendum)
[facility] Emergency Department Provider Note  ____________________________________________   I have reviewed the triage vital signs and the nursing notes.   HISTORY  Chief Complaint Lightheadedness  History limited by: Not Limited   HPI Kayla [name] is a 86 y.o. female who presents to the emergency department today with primary concern for lightheadedness.  The patient states that she noticed it today.  Occurs when she goes to stand up.  She feels lightheaded as if she might pass out.  She did not pass out.  This is not been accompanied by any shortness of breath or chest pain.  In addition she has complaints of leg numbness.  This has been going on for a couple of weeks.  She states that it started in her left lower leg after she got a shot in her knee for arthritis.  She also is now feeling some of the numbness in her right lower extremity.  Patient denies any fevers.   Records reviewed. Per medical record review patient has a history of CAD, HLD, HTN.   Past Medical History:  Diagnosis Date  . Anginal pain (HCC)   . Arthritis   . Bundle branch block, left    Chronic  . CAD (coronary artery disease)   . Cholecystitis    from record  . Emphysema lung (HCC)   . GERD (gastroesophageal reflux disease)   . Heart murmur   . HOH (hard of hearing)   . Hyperlipidemia   . Hypertension   . Hypothyroidism   . Orthostatic headache    Mild  . Pain    HIP AND LEG PAIN USES  CANE  . Vertigo     Patient Active Problem List   Diagnosis Date Noted  . Low grade fever 10/20/2019    Past Surgical History:  Procedure Laterality Date  . APPENDECTOMY  1957  . BREAST BIOPSY Left 2011   neg, stereo done by Dr. Evette Cristal  . BREAST BIOPSY    . CARDIAC CATHETERIZATION  2000   [facility]  . CATARACT EXTRACTION Right    right 2004, left 2018   . CATARACT EXTRACTION W/PHACO Left 04/12/2017   Procedure: CATARACT EXTRACTION PHACO AND INTRAOCULAR LENS PLACEMENT (IOC);   Surgeon: Lockie Mola, MD;  Location: [facility] ORS;  Service: Ophthalmology;  Laterality: Left;  [place] 01:09.0AP% 23.7CDE 16.35FLUID PACK LOT # [id number] H  . CHOLECYSTECTOMY N/A 04/25/2016   Procedure: LAPAROSCOPIC CHOLECYSTECTOMY WITH INTRAOPERATIVE CHOLANGIOGRAM;  Surgeon: Kieth Brightly, MD;  Location: [facility] ORS;  Service: General;  Laterality: N/A;  . CORONARY ANGIOPLASTY     STENT  . CORONARY ARTERY BYPASS GRAFT  2001  . KNEE SURGERY    . thyroid  1970  . UPPER GI ENDOSCOPY  2014    Prior to Admission medications   Medication Sig Start Date End Date Taking? Authorizing Provider  amLODipine (NORVASC) 10 MG tablet TAKE 1 TABLET BY MOUTH  DAILY Patient taking differently: Take 10 mg by mouth daily.  04/02/19   Allegra Grana, FNP  aspirin 81 MG tablet Take 81 mg by mouth daily.      [provider]  carvedilol (COREG) 12.5 MG tablet TAKE 1 TABLET BY MOUTH  TWICE DAILY 09/01/19   Allegra Grana, FNP  cloNIDine (CATAPRES) 0.1 MG tablet TAKE 1 TABLET BY MOUTH  TWICE DAILY 09/01/19   Antonieta Iba, MD  Cyanocobalamin (VITAMIN B 12 PO) Take 3,000 Units by mouth daily.    [provider]  famotidine (PEPCID)  20 MG tablet TAKE 1 TABLET BY MOUTH  TWICE DAILY 04/02/19   Burnard Hawthorne, FNP  hydrALAZINE (APRESOLINE) 25 MG tablet TAKE 1 TABLET BY MOUTH 3  TIMES DAILY AS NEEDED (SBP  ABOVE 150) 09/30/19   Gollan, Kathlene November, MD  levothyroxine (SYNTHROID) 100 MCG tablet TAKE 1 TABLET BY MOUTH  DAILY BEFORE BREAKFAST 04/02/19   Burnard Hawthorne, FNP  losartan (COZAAR) 100 MG tablet TAKE 1 TABLET BY MOUTH  DAILY 02/27/19   Burnard Hawthorne, FNP  nitroGLYCERIN (NITROSTAT) 0.4 MG SL tablet Place 1 tablet (0.4 mg total) under the tongue every 5 (five) minutes as needed for chest pain. 03/21/19   Burnard Hawthorne, FNP  pravastatin (PRAVACHOL) 20 MG tablet TAKE 1 TABLET BY MOUTH AT  BEDTIME 05/20/19   Burnard Hawthorne, FNP    Allergies Bee pollen, Boniva [ibandronic  acid], Pollen extract, and Motrin [ibuprofen]  Family History  Problem Relation Age of Onset  . Heart failure Mother 36  . Heart failure Sister 42  . Heart attack Sister   . Heart attack Brother 39    Social History Social History   Tobacco Use  . Smoking status: Never Smoker  . Smokeless tobacco: Never Used  Substance Use Topics  . Alcohol use: No  . Drug use: No    Review of Systems Constitutional: No fever/chills Eyes: No visual changes. ENT: No sore throat. Cardiovascular: Denies chest pain. Respiratory: Denies shortness of breath. Gastrointestinal: No abdominal pain.  No nausea, no vomiting.  No diarrhea.   Genitourinary: Negative for dysuria. Musculoskeletal: Negative for back pain. Skin: Negative for rash. Neurological: Positive for lightheadedness. ____________________________________________   PHYSICAL EXAM:  VITAL SIGNS: ED Triage Vitals  Enc Vitals Group     BP 11/14/19 1124 125/65     Pulse Rate 11/14/19 1124 (!) 141     Resp 11/14/19 1124 18     Temp 11/14/19 1124 98.6 F (37 C)     Temp Source 11/14/19 1124 Oral     SpO2 11/14/19 1124 100 %     Weight 11/14/19 1125 151 lb (68.5 kg)     Height 11/14/19 1125 5\' 1"  (1.549 m)     Head Circumference --      Peak Flow --      Pain Score 11/14/19 1125 0   Constitutional: Alert and oriented.  Eyes: Conjunctivae are normal.  ENT      Head: Normocephalic and atraumatic.      Nose: No congestion/rhinnorhea.      Mouth/Throat: Mucous membranes are moist.      Neck: No stridor. Hematological/Lymphatic/Immunilogical: No cervical lymphadenopathy. Cardiovascular: Normal rate, regular rhythm.  No murmurs, rubs, or gallops.  Respiratory: Normal respiratory effort without tachypnea nor retractions. Breath sounds are clear and equal bilaterally. No wheezes/rales/rhonchi. Gastrointestinal: Soft and non tender. No rebound. No guarding.  Genitourinary: Deferred Musculoskeletal: Normal range of motion in all  extremities. No lower extremity edema. Neurologic:  Normal speech and language. No gross focal neurologic deficits are appreciated.  Skin:  Skin is warm, dry and intact. No rash noted. Psychiatric: Mood and affect are normal. Speech and behavior are normal. Patient exhibits appropriate insight and judgment.  ____________________________________________    LABS (pertinent positives/negatives)  BMP na 131, cl 96, glu 122, cr 0.97 CBC wbc 8.4, hgb 14.0, plt 170 UA not consistent with infection  ____________________________________________   EKG  I, Nance Pear, attending physician, personally viewed and interpreted this EKG  EKG Time: 1128 Rate:  85 Rhythm: atrial firbrillattion Axis: left axis deviation Intervals: qtc 454 QRS: LVH ST changes: no st elevation Impression: abnormal ekg  ____________________________________________    RADIOLOGY  None  ____________________________________________   PROCEDURES  Procedures  ____________________________________________   INITIAL IMPRESSION / ASSESSMENT AND PLAN / ED COURSE  Pertinent labs & imaging results that were available during my care of the patient were reviewed by me and considered in my medical decision making (see chart for details).   Patient presented to the emergency department today with concerns for some lightheadedness especially when standing.  Work-up here shows slight hyponatremia and hypochloremia concerning for dehydration.  She was given IV fluids and afterwards was able to ambulate without any issue.  Additionally blood work without any findings concerning for heart damage or infection.  EKG however did show atrial fibrillation which is a new finding for the patient. Discussed with Dr. Mariah Milling, her cardiologist. Will start her on eliquis. He will plan on following up in clinic. Discussed findings and plan with patient and family. Will plan on discharging  home.  ____________________________________________   FINAL CLINICAL IMPRESSION(S) / ED DIAGNOSES  Final diagnoses:  Lightheadedness  Dehydration  Atrial fibrillation, unspecified type [facility])     Note: This dictation was prepared with Dragon dictation. Any transcriptional errors that result from this process are unintentional     Phineas Semen, MD 11/14/19 1514    Phineas Semen, MD 11/14/19 [phone]

## 2019-11-14 NOTE — Telephone Encounter (Signed)
Seen in the emergency room November 14, 2019 Noted to have some lightheadedness/orthostasis Was given IV fluids, improvement of her symptoms EKG documenting atrial fibrillation, new finding Recommendation made to start Eliquis 5 twice daily, hold aspirin Coupon provided  Can we arrange follow-up in clinic with Tyra Gural or APP in the next week or so thx TGollan

## 2019-11-14 NOTE — ED Notes (Signed)
PT ambulated in hallway. Pt ambulatory with cane and no assistance.

## 2019-11-14 NOTE — ED Notes (Signed)
Pt urinated in bed at this time. Pt cleaned, placed in gown and linens changed.

## 2019-11-14 NOTE — ED Triage Notes (Signed)
Pt via ems from home with dizziness, nausea upon standing. Pt also c/o numbness in her legs when standing; all goes away when she sits down. Pt alert& oriented; nad noted.

## 2019-11-14 NOTE — Discharge Instructions (Addendum)
Please stop your aspirin and start taking the eliquis. Please seek medical attention for any high fevers, chest pain, shortness of breath, change in behavior, persistent vomiting, bloody stool or any other new or concerning symptoms.

## 2019-11-14 NOTE — ED Notes (Signed)
See triage note. Pt denies CP and SOB. Pt AOx4, NAD noted. Skin is warm and dry. Pt denies any history of A-fibb.

## 2019-11-15 NOTE — Progress Notes (Signed)
Cardiology Office Note  Date:  11/17/2019   ID:  Kayla [name], DOB [birth year], MRN [id number]  PCP:  Burnard Hawthorne, FNP   Chief Complaint  Patient presents with  . OTHER    ED Arrhythmia c/o edema/weakness legs/feet. Meds reviewed verbally with pt.    HPI:  Kayla [name] is an 86 y/o woman with h/o  CAD s/p CABG in 2001 at [facility], LBBB,  hyperlipidemia,  GERD   HTN.  Myoview in 2009 was normal.  Mild aortic valve stenosis History of PVCs Here for routine f/u Of her coronary artery disease and hypertension, new atrial fibrillation  Beginning of April, had cortisone in knees,b/l With walking, pins and needles in her knees and legs  Seen in the emergency room November 14, 2019 Noted to have some lightheadedness/orthostasis Was given IV fluids, improvement of her symptoms EKG documenting atrial fibrillation, new finding Recommendation made to start Eliquis 5 twice daily, hold aspirin Coupon provided  Today NSR, No more dizziness Discussed previous syncope/dizziness episode in September 2020, wonders if it could have been atrial fibrillation Daughter reports that EMTs at the time thought she was in atrial fibrillation but it resolved by the time she went to the emergency room  Chronic lower extremity swelling, does not wear compression hose Chronic knee pain  Lab work reviewed total cholesterol 124 HBA1C 6.0 CR 1.14  Carotid 40 -50 on the right <39 on the left  EKG personally reviewed by myself on todays visit Shows normal sinus rhythm rate 54 bpm left bundle branch block   unwitnessed syncope 03/2019.   recently been started on isosorbide mononitrate for elevated BP.  sitting in a bath and began to feel dizzy.   he got out of the bath and sat on the toilet.   syncopal episode falling off the toilet and striking her left shoulder.   Imdur was discontinued and she was placed back on amlodipine.    Echocardiogram   normal LV function Mild AI, TR, MR  Other  past medical history Previous admission to the hospital for chest discomfort. Rule out and was sent home Previous GERD symptoms, takes proton pump inhibitor  PMH:   has a past medical history of Anginal pain ([place]), Arthritis, Bundle branch block, left, CAD (coronary artery disease), Cholecystitis, Emphysema lung ([place]), GERD (gastroesophageal reflux disease), Heart murmur, HOH (hard of hearing), Hyperlipidemia, Hypertension, Hypothyroidism, Orthostatic headache, Pain, and Vertigo.  PSH:    Past Surgical History:  Procedure Laterality Date  . APPENDECTOMY  1957  . BREAST BIOPSY Left 2011   neg, stereo done by Dr. Jamal Collin  . BREAST BIOPSY    . CARDIAC CATHETERIZATION  2000   [facility]  . CATARACT EXTRACTION Right    right 2004, left 2018   . CATARACT EXTRACTION W/PHACO Left 04/12/2017   Procedure: CATARACT EXTRACTION PHACO AND INTRAOCULAR LENS PLACEMENT (IOC);  Surgeon: Leandrew Koyanagi, MD;  Location: [facility] ORS;  Service: Ophthalmology;  Laterality: Left;  [place] 01:09.0AP% 23.7CDE 16.35FLUID PACK LOT # [id number] H  . CHOLECYSTECTOMY N/A 04/25/2016   Procedure: LAPAROSCOPIC CHOLECYSTECTOMY WITH INTRAOPERATIVE CHOLANGIOGRAM;  Surgeon: Christene Lye, MD;  Location: [facility] ORS;  Service: General;  Laterality: N/A;  . CORONARY ANGIOPLASTY     STENT  . CORONARY ARTERY BYPASS GRAFT  2001  . KNEE SURGERY    . thyroid  1970  . UPPER GI ENDOSCOPY  2014    Current Outpatient Medications  Medication Sig Dispense Refill  . amLODipine (NORVASC) 10 MG tablet TAKE 1 TABLET  BY MOUTH  DAILY (Patient taking differently: Take 10 mg by mouth daily. ) 90 tablet 3  . apixaban (ELIQUIS) 5 MG TABS tablet Take 1 tablet (5 mg total) by mouth 2 (two) times daily. 60 tablet 0  . carvedilol (COREG) 12.5 MG tablet TAKE 1 TABLET BY MOUTH  TWICE DAILY 180 tablet 3  . cloNIDine (CATAPRES) 0.1 MG tablet TAKE 1 TABLET BY MOUTH  TWICE DAILY 180 tablet 3  . Cyanocobalamin (VITAMIN B 12 PO) Take 3,000 Units by mouth daily.     . famotidine (PEPCID) 20 MG tablet TAKE 1 TABLET BY MOUTH  TWICE DAILY 180 tablet 3  . hydrALAZINE (APRESOLINE) 25 MG tablet TAKE 1 TABLET BY MOUTH 3  TIMES DAILY AS NEEDED (SBP  ABOVE 150) 90 tablet 2  . levothyroxine (SYNTHROID) 100 MCG tablet TAKE 1 TABLET BY MOUTH  DAILY BEFORE BREAKFAST 90 tablet 3  . losartan (COZAAR) 100 MG tablet TAKE 1 TABLET BY MOUTH  DAILY 90 tablet 3  . nitroGLYCERIN (NITROSTAT) 0.4 MG SL tablet Place 1 tablet (0.4 mg total) under the tongue every 5 (five) minutes as needed for chest pain. 25 tablet 1  . pravastatin (PRAVACHOL) 20 MG tablet TAKE 1 TABLET BY MOUTH AT  BEDTIME 90 tablet 3   No current facility-administered medications for this visit.     Allergies:   Bee pollen, Boniva [ibandronic acid], Pollen extract, and Motrin [ibuprofen]   Social History:  The patient  reports that she has never smoked. She has never used smokeless tobacco. She reports that she does not drink alcohol or use drugs.   Family History:   family history includes Heart attack in her sister; Heart attack (age of onset: 49) in her brother; Heart failure (age of onset: 61) in her sister; Heart failure (age of onset: 3) in her mother.    Review of Systems: Review of Systems  Constitutional: Positive for malaise/fatigue.  HENT: Negative.   Respiratory: Negative.   Cardiovascular: Negative.   Gastrointestinal: Negative.   Musculoskeletal: Negative.   Neurological: Negative.   Psychiatric/Behavioral: Negative.   All other systems reviewed and are negative.   PHYSICAL EXAM: VS:  BP 134/60 (BP Location: Left Arm, Patient Position: Sitting, Cuff Size: Normal)   Pulse (!) 54   Ht 5\' 1"  (1.549 m)   Wt 149 lb 6 oz (67.8 kg)   SpO2 97%   BMI 28.22 kg/m  , BMI Body mass index is 28.22 kg/m.  Constitutional:  oriented to person, place, and time. No distress.  HENT:  Head: Grossly normal Eyes:  no discharge. No scleral icterus.  Neck: No JVD, no carotid bruits  Cardiovascular:  Regular rate and rhythm, no murmurs appreciated Pulmonary/Chest: Clear to auscultation bilaterally, no wheezes or rails Abdominal: Soft.  no distension.  no tenderness.  Musculoskeletal: Normal range of motion Neurological:  normal muscle tone. Coordination normal. 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 No further workup at this time. Continue current medication regimen.  Essential hypertension  Blood pressure stable, she does have bradycardia but is asymptomatic We will not reduce the dose of carvedilol given recently documented atrial fibrillation  Paroxysmal A. Fib Converted to normal sinus rhythm on her own after documented atrial fibrillation in the emergency room Asymptomatic bradycardia We will hold off on starting antiarrhythmics Unclear if there is a relationship between the syncopal episode September 2020 and possible arrhythmia Daughter thinks she was in atrial fibrillation at the time but converted to normal sinus rhythm on route to the hospital Recommend any further dizziness at the call our office, sit down or lay down  Aortic valve stenosis, etiology of cardiac valve disease unspecified -  Mild aortic valve stenosis,  Impressive murmur  Carotid stenosis, right 40-59% stenosis on the right,  Less than 39% blockage on the left,  No significant change,  results discussed with her from last month  Hyperlipidemia Cholesterol at goal, Results discussed with her, no medication changes made   Total encounter time more than 25 minutes  Greater than 50% was spent in counseling and coordination of care with the patient  Disposition:   F/U  12 months   Orders Placed This Encounter  Procedures  . EKG 12-Lead     Signed, Dossie Arbour, M.D., Ph.D. 11/17/2019  [facility] [place], [place] [phone]

## 2019-11-17 ENCOUNTER — Ambulatory Visit (INDEPENDENT_AMBULATORY_CARE_PROVIDER_SITE_OTHER): Payer: Medicare Other | Admitting: Cardiovascular Disease

## 2019-11-17 ENCOUNTER — Other Ambulatory Visit: Payer: Self-pay

## 2019-11-17 ENCOUNTER — Encounter: Payer: Self-pay | Admitting: Cardiovascular Disease

## 2019-11-17 VITALS — BP 134/60 | HR 54 | Ht 61.0 in | Wt 149.4 lb

## 2019-11-17 DIAGNOSIS — I10 Essential (primary) hypertension: Secondary | ICD-10-CM

## 2019-11-17 DIAGNOSIS — K551 Chronic vascular disorders of intestine: Secondary | ICD-10-CM

## 2019-11-17 DIAGNOSIS — I739 Peripheral vascular disease, unspecified: Secondary | ICD-10-CM | POA: Diagnosis not present

## 2019-11-17 DIAGNOSIS — I519 Heart disease, unspecified: Secondary | ICD-10-CM

## 2019-11-17 DIAGNOSIS — I4819 Other persistent atrial fibrillation: Secondary | ICD-10-CM | POA: Diagnosis not present

## 2019-11-17 DIAGNOSIS — I351 Nonrheumatic aortic (valve) insufficiency: Secondary | ICD-10-CM

## 2019-11-17 DIAGNOSIS — I340 Nonrheumatic mitral (valve) insufficiency: Secondary | ICD-10-CM

## 2019-11-17 DIAGNOSIS — I25118 Atherosclerotic heart disease of native coronary artery with other forms of angina pectoris: Secondary | ICD-10-CM | POA: Diagnosis not present

## 2019-11-17 DIAGNOSIS — I6523 Occlusion and stenosis of bilateral carotid arteries: Secondary | ICD-10-CM | POA: Diagnosis not present

## 2019-11-17 NOTE — Patient Instructions (Addendum)
Medication Instructions:  No changes  For pain take tylenol Talk with orthopedics, Ask for tramadol, celebrex (similar to advil)  If you need a refill on your cardiac medications before your next appointment, please call your pharmacy.    Lab work: No new labs needed   If you have labs (blood work) drawn today and your tests are completely normal, you will receive your results only by: [name] MyChart Message (if you have MyChart) OR . A paper copy in the mail If you have any lab test that is abnormal or we need to change your treatment, we will call you to review the results.   Testing/Procedures: No new testing needed   Follow-Up: At [facility], you and your health needs are our priority.  As part of our continuing mission to provide you with exceptional heart care, we have created designated Provider Care Teams.  These Care Teams include your primary Cardiologist (physician) and Advanced Practice Providers (APPs -  Physician Assistants and Nurse Practitioners) who all work together to provide you with the care you need, when you need it.  . You will need a follow up appointment in 6 months .  [name] Providers on your designated Care Team:   . Nicolasa Ducking, NP . Eula Listen, PA-C . Marisue Ivan, PA-C  Any Other Special Instructions Will Be Listed Below (If Applicable).  For educational health videos Log in to : [email or web address] Or : [email or web address], password : [email or web address]

## 2019-11-20 ENCOUNTER — Telehealth: Payer: Self-pay

## 2019-11-20 NOTE — Telephone Encounter (Signed)
Call to patient to review carotid u/s results.    Pt verbalized understanding and has no further questions at this time.    Advised pt to call for any further questions or concerns.  No further orders.   

## 2019-11-20 NOTE — Telephone Encounter (Signed)
-----   Message from Antonieta Iba, MD sent at 11/19/2019  2:55 PM EDT ----- Carotid ultrasound Right Carotid: Velocities in the right ICA are consistent with a 40-59%                stenosis.  Left Carotid: Velocities in the left ICA are consistent with a 1-39% stenosis. Suggest repeat 2 years

## 2019-11-26 ENCOUNTER — Ambulatory Visit (INDEPENDENT_AMBULATORY_CARE_PROVIDER_SITE_OTHER): Payer: Medicare Other | Admitting: Family

## 2019-11-26 ENCOUNTER — Other Ambulatory Visit: Payer: Self-pay

## 2019-11-26 ENCOUNTER — Encounter: Payer: Self-pay | Admitting: Family

## 2019-11-26 VITALS — BP 138/58 | HR 53 | Temp 95.6°F | Ht 61.0 in | Wt 150.8 lb

## 2019-11-26 DIAGNOSIS — I4891 Unspecified atrial fibrillation: Secondary | ICD-10-CM

## 2019-11-26 DIAGNOSIS — I10 Essential (primary) hypertension: Secondary | ICD-10-CM | POA: Diagnosis not present

## 2019-11-26 LAB — BASIC METABOLIC PANEL
BUN: 18 mg/dL (ref 6–23)
CO2: 27 mEq/L (ref 19–32)
Calcium: 9.3 mg/dL (ref 8.4–10.5)
Chloride: 94 mEq/L — ABNORMAL LOW (ref 96–112)
Creatinine, Ser: 1 mg/dL (ref 0.40–1.20)
GFR: 52.02 mL/min — ABNORMAL LOW (ref >60.00)
Glucose, Bld: 109 mg/dL — ABNORMAL HIGH (ref 70–99)
Potassium: 4.8 mEq/L (ref 3.5–5.1)
Sodium: 128 mEq/L — ABNORMAL LOW (ref 135–145)

## 2019-11-26 MED ORDER — APIXABAN 5 MG PO TABS: 5.0000 mg | ORAL_TABLET | Freq: Two times a day (BID) | ORAL | 1 refills | Status: DC

## 2019-11-26 NOTE — Progress Notes (Signed)
Subjective:    Patient ID: Kayla [name], female    DOB: September 23, 1926, 86 y.o.   MRN: [id number]  CC: JUPITER BOYS is a 86 y.o. female who presents today for follow up.   HPI: Feels well today Accompanied by daughter  Dizziness resolved.   Able to wash dishes, cooks, laundry. Stays busy throughout the day.   HTN - At home 125/63, 56; 128/65, 57; 134/66, 60  Paroxysmal Afib- Off the aspirin.  On eliquis. No bleeding.   Denies exertional chest pain or pressure, numbness or tingling radiating to left arm or jaw, palpitations, dizziness, frequent headaches, changes in vision, or shortness of breath.     11/17/19 - [place]. Converted back to NSR. Asymptomatic bradycardia.  ED 11/14/19 for lightheadedness. Hyponatremia, hypokalemia, concerning for dehydration. New onset atrial fib. ON eliquis HISTORY:  Past Medical History:  Diagnosis Date  . Anginal pain ([place])   . Arthritis   . Bundle branch block, left    Chronic  . CAD (coronary artery disease)   . Cholecystitis    from record  . Emphysema lung ([place])   . GERD (gastroesophageal reflux disease)   . Heart murmur   . HOH (hard of hearing)   . Hyperlipidemia   . Hypertension   . Hypothyroidism   . Orthostatic headache    Mild  . Pain    HIP AND LEG PAIN USES  CANE  . Vertigo    Past Surgical History:  Procedure Laterality Date  . APPENDECTOMY  1957  . BREAST BIOPSY Left 2011   neg, stereo done by Dr. Jamal Collin  . BREAST BIOPSY    . CARDIAC CATHETERIZATION  2000   [facility]  . CATARACT EXTRACTION Right    right 2004, left 2018   . CATARACT EXTRACTION W/PHACO Left 04/12/2017   Procedure: CATARACT EXTRACTION PHACO AND INTRAOCULAR LENS PLACEMENT (IOC);  Surgeon: Leandrew Koyanagi, MD;  Location: [facility] ORS;  Service: Ophthalmology;  Laterality: Left;  [place] 01:09.0AP% 23.7CDE 16.35FLUID PACK LOT # [id number] H  . CHOLECYSTECTOMY N/A 04/25/2016   Procedure: LAPAROSCOPIC CHOLECYSTECTOMY WITH INTRAOPERATIVE CHOLANGIOGRAM;  Surgeon:  Christene Lye, MD;  Location: [facility] ORS;  Service: General;  Laterality: N/A;  . CORONARY ANGIOPLASTY     STENT  . CORONARY ARTERY BYPASS GRAFT  2001  . KNEE SURGERY    . thyroid  1970  . UPPER GI ENDOSCOPY  2014   Family History  Problem Relation Age of Onset  . Heart failure Mother 65  . Heart failure Sister 63  . Heart attack Sister   . Heart attack Brother 42    Allergies: Bee pollen, Boniva [ibandronic acid], Pollen extract, and Motrin [ibuprofen] Current Outpatient Medications on File Prior to Visit  Medication Sig Dispense Refill  . amLODipine (NORVASC) 10 MG tablet TAKE 1 TABLET BY MOUTH  DAILY (Patient taking differently: Take 10 mg by mouth daily. ) 90 tablet 3  . carvedilol (COREG) 12.5 MG tablet TAKE 1 TABLET BY MOUTH  TWICE DAILY 180 tablet 3  . cloNIDine (CATAPRES) 0.1 MG tablet TAKE 1 TABLET BY MOUTH  TWICE DAILY 180 tablet 3  . Cyanocobalamin (VITAMIN B 12 PO) Take 3,000 Units by mouth daily.    . famotidine (PEPCID) 20 MG tablet TAKE 1 TABLET BY MOUTH  TWICE DAILY 180 tablet 3  . hydrALAZINE (APRESOLINE) 25 MG tablet TAKE 1 TABLET BY MOUTH 3  TIMES DAILY AS NEEDED (SBP  ABOVE 150) 90 tablet 2  . levothyroxine (SYNTHROID) 100 MCG tablet  TAKE 1 TABLET BY MOUTH  DAILY BEFORE BREAKFAST 90 tablet 3  . losartan (COZAAR) 100 MG tablet TAKE 1 TABLET BY MOUTH  DAILY 90 tablet 3  . nitroGLYCERIN (NITROSTAT) 0.4 MG SL tablet Place 1 tablet (0.4 mg total) under the tongue every 5 (five) minutes as needed for chest pain. 25 tablet 1  . pravastatin (PRAVACHOL) 20 MG tablet TAKE 1 TABLET BY MOUTH AT  BEDTIME 90 tablet 3   No current facility-administered medications on file prior to visit.    Social History   Tobacco Use  . Smoking status: Never Smoker  . Smokeless tobacco: Never Used  Substance Use Topics  . Alcohol use: No  . Drug use: No    Review of Systems  Constitutional: Negative for chills and fever.  Respiratory: Negative for cough and shortness of  breath.   Cardiovascular: Negative for chest pain, palpitations and leg swelling.  Gastrointestinal: Negative for nausea and vomiting.  Neurological: Negative for dizziness.      Objective:    BP (!) 138/58   Pulse (!) 53   Temp (!) 95.6 F (35.3 C) (Temporal)   Ht 5\' 1"  (1.549 m)   Wt 150 lb 12.8 oz (68.4 kg)   SpO2 99%   BMI 28.49 kg/m  BP Readings from Last 3 Encounters:  11/26/19 (!) 138/58  11/17/19 134/60  11/14/19 (!) 161/81   Wt Readings from Last 3 Encounters:  11/26/19 150 lb 12.8 oz (68.4 kg)  11/17/19 149 lb 6 oz (67.8 kg)  11/14/19 151 lb (68.5 kg)    Physical Exam Vitals reviewed.  Constitutional:      Appearance: She is well-developed.  Eyes:     Conjunctiva/sclera: Conjunctivae normal.  Cardiovascular:     Rate and Rhythm: Normal rate and regular rhythm.     Pulses: Normal pulses.     Heart sounds: Normal heart sounds.  Pulmonary:     Effort: Pulmonary effort is normal.     Breath sounds: Normal breath sounds. No wheezing, rhonchi or rales.  Musculoskeletal:     Right lower leg: No edema.     Left lower leg: No edema.  Skin:    General: Skin is warm and dry.  Neurological:     Mental Status: She is alert.  Psychiatric:        Speech: Speech normal.        Behavior: Behavior normal.        Thought Content: Thought content normal.        Assessment & Plan:   Problem List Items Addressed This Visit      Cardiovascular and Mediastinum   Atrial fibrillation (HCC) - Primary    Paroxysmal.  Remains on Eliquis.  She is off aspirin.  She is followed Dr. 11/16/19.  Pleased as patient appears asymptomatic today, dizziness resolved.      Relevant Medications   apixaban (ELIQUIS) 5 MG TABS tablet   Other Relevant Orders   Basic metabolic panel (Completed)   Hypertension    Overall at goal.  Limited in increasing antihypertensive regimen based on low diastolic and bradycardia.  We will continue to monitor.      Relevant Medications   apixaban  (ELIQUIS) 5 MG TABS tablet       I am having Stevey W. Maynez maintain her Cyanocobalamin (VITAMIN B 12 PO), losartan, nitroGLYCERIN, famotidine, amLODipine, levothyroxine, pravastatin, cloNIDine, carvedilol, hydrALAZINE, and apixaban.   Meds ordered this encounter  Medications  . apixaban (ELIQUIS) 5 MG TABS  tablet    Sig: Take 1 tablet (5 mg total) by mouth 2 (two) times daily.    Dispense:  60 tablet    Refill:  1    Order Specific Question:   Supervising Provider    Answer:   Sherlene Shams [[id number]]    Return precautions given.   Risks, benefits, and alternatives of the medications and treatment plan prescribed today were discussed, and patient expressed understanding.   Education regarding symptom management and diagnosis given to patient on AVS.  Continue to follow with Allegra Grana, FNP for routine health maintenance.   Candie Echevaria and I agreed with plan.   Rennie Plowman, FNP

## 2019-11-26 NOTE — Patient Instructions (Signed)
Nice to see you!   

## 2019-11-27 DIAGNOSIS — Z7901 Long term (current) use of anticoagulants: Secondary | ICD-10-CM | POA: Diagnosis not present

## 2019-11-27 DIAGNOSIS — R262 Difficulty in walking, not elsewhere classified: Secondary | ICD-10-CM | POA: Diagnosis not present

## 2019-11-27 DIAGNOSIS — M170 Bilateral primary osteoarthritis of knee: Secondary | ICD-10-CM | POA: Diagnosis not present

## 2019-11-27 DIAGNOSIS — I10 Essential (primary) hypertension: Secondary | ICD-10-CM | POA: Diagnosis not present

## 2019-11-27 DIAGNOSIS — I2510 Atherosclerotic heart disease of native coronary artery without angina pectoris: Secondary | ICD-10-CM | POA: Diagnosis not present

## 2019-11-28 NOTE — Assessment & Plan Note (Signed)
Overall at goal.  Limited in increasing antihypertensive regimen based on low diastolic and bradycardia.  We will continue to monitor.

## 2019-11-28 NOTE — Assessment & Plan Note (Signed)
Paroxysmal.  Remains on Eliquis.  She is off aspirin.  She is followed Dr. Mariah Milling.  Pleased as patient appears asymptomatic today, dizziness resolved.

## 2019-12-01 ENCOUNTER — Other Ambulatory Visit: Payer: Self-pay | Admitting: Family

## 2019-12-01 DIAGNOSIS — I2510 Atherosclerotic heart disease of native coronary artery without angina pectoris: Secondary | ICD-10-CM | POA: Diagnosis not present

## 2019-12-01 DIAGNOSIS — E871 Hypo-osmolality and hyponatremia: Secondary | ICD-10-CM

## 2019-12-01 DIAGNOSIS — M170 Bilateral primary osteoarthritis of knee: Secondary | ICD-10-CM | POA: Diagnosis not present

## 2019-12-01 DIAGNOSIS — Z7901 Long term (current) use of anticoagulants: Secondary | ICD-10-CM | POA: Diagnosis not present

## 2019-12-01 DIAGNOSIS — I10 Essential (primary) hypertension: Secondary | ICD-10-CM | POA: Diagnosis not present

## 2019-12-01 DIAGNOSIS — R262 Difficulty in walking, not elsewhere classified: Secondary | ICD-10-CM | POA: Diagnosis not present

## 2019-12-01 NOTE — Progress Notes (Signed)
ref

## 2019-12-03 ENCOUNTER — Other Ambulatory Visit: Payer: Self-pay | Admitting: Cardiovascular Disease

## 2019-12-03 DIAGNOSIS — I2510 Atherosclerotic heart disease of native coronary artery without angina pectoris: Secondary | ICD-10-CM | POA: Diagnosis not present

## 2019-12-03 DIAGNOSIS — M170 Bilateral primary osteoarthritis of knee: Secondary | ICD-10-CM | POA: Diagnosis not present

## 2019-12-03 DIAGNOSIS — I10 Essential (primary) hypertension: Secondary | ICD-10-CM | POA: Diagnosis not present

## 2019-12-03 DIAGNOSIS — R262 Difficulty in walking, not elsewhere classified: Secondary | ICD-10-CM | POA: Diagnosis not present

## 2019-12-03 DIAGNOSIS — Z7901 Long term (current) use of anticoagulants: Secondary | ICD-10-CM | POA: Diagnosis not present

## 2019-12-08 DIAGNOSIS — R262 Difficulty in walking, not elsewhere classified: Secondary | ICD-10-CM | POA: Diagnosis not present

## 2019-12-08 DIAGNOSIS — M170 Bilateral primary osteoarthritis of knee: Secondary | ICD-10-CM | POA: Diagnosis not present

## 2019-12-08 DIAGNOSIS — I10 Essential (primary) hypertension: Secondary | ICD-10-CM | POA: Diagnosis not present

## 2019-12-08 DIAGNOSIS — I2510 Atherosclerotic heart disease of native coronary artery without angina pectoris: Secondary | ICD-10-CM | POA: Diagnosis not present

## 2019-12-08 DIAGNOSIS — Z7901 Long term (current) use of anticoagulants: Secondary | ICD-10-CM | POA: Diagnosis not present

## 2019-12-10 ENCOUNTER — Telehealth: Payer: Self-pay | Admitting: Family

## 2019-12-10 ENCOUNTER — Other Ambulatory Visit (INDEPENDENT_AMBULATORY_CARE_PROVIDER_SITE_OTHER): Payer: Medicare Other

## 2019-12-10 ENCOUNTER — Other Ambulatory Visit: Payer: Self-pay

## 2019-12-10 DIAGNOSIS — E871 Hypo-osmolality and hyponatremia: Secondary | ICD-10-CM

## 2019-12-10 NOTE — Telephone Encounter (Signed)
Pt would like to get her sodium rechecked. I let her know that I would check with her provider and someone will give her call back.

## 2019-12-10 NOTE — Telephone Encounter (Signed)
Per margaret okay to order BMP. I have ordered & patient scheduled to have done today.

## 2019-12-11 DIAGNOSIS — Z7901 Long term (current) use of anticoagulants: Secondary | ICD-10-CM | POA: Diagnosis not present

## 2019-12-11 DIAGNOSIS — R262 Difficulty in walking, not elsewhere classified: Secondary | ICD-10-CM | POA: Diagnosis not present

## 2019-12-11 DIAGNOSIS — M170 Bilateral primary osteoarthritis of knee: Secondary | ICD-10-CM | POA: Diagnosis not present

## 2019-12-11 DIAGNOSIS — I10 Essential (primary) hypertension: Secondary | ICD-10-CM | POA: Diagnosis not present

## 2019-12-11 DIAGNOSIS — I2510 Atherosclerotic heart disease of native coronary artery without angina pectoris: Secondary | ICD-10-CM | POA: Diagnosis not present

## 2019-12-11 LAB — BASIC METABOLIC PANEL
BUN: 17 mg/dL (ref 6–23)
CO2: 22 mEq/L (ref 19–32)
Calcium: 8.8 mg/dL (ref 8.4–10.5)
Chloride: 98 mEq/L (ref 96–112)
Creatinine, Ser: 0.91 mg/dL (ref 0.40–1.20)
GFR: 57.99 mL/min — ABNORMAL LOW (ref >60.00)
Glucose, Bld: 127 mg/dL — ABNORMAL HIGH (ref 70–99)
Potassium: 4.5 mEq/L (ref 3.5–5.1)
Sodium: 127 mEq/L — ABNORMAL LOW (ref 135–145)

## 2019-12-16 DIAGNOSIS — I2510 Atherosclerotic heart disease of native coronary artery without angina pectoris: Secondary | ICD-10-CM | POA: Diagnosis not present

## 2019-12-16 DIAGNOSIS — M170 Bilateral primary osteoarthritis of knee: Secondary | ICD-10-CM | POA: Diagnosis not present

## 2019-12-16 DIAGNOSIS — I10 Essential (primary) hypertension: Secondary | ICD-10-CM | POA: Diagnosis not present

## 2019-12-16 DIAGNOSIS — Z7901 Long term (current) use of anticoagulants: Secondary | ICD-10-CM | POA: Diagnosis not present

## 2019-12-16 DIAGNOSIS — R262 Difficulty in walking, not elsewhere classified: Secondary | ICD-10-CM | POA: Diagnosis not present

## 2019-12-17 ENCOUNTER — Ambulatory Visit: Payer: Medicare Other | Admitting: Physician Assistant

## 2019-12-20 ENCOUNTER — Other Ambulatory Visit: Payer: Self-pay

## 2019-12-20 ENCOUNTER — Emergency Department
Admission: EM | Admit: 2019-12-20 | Discharge: 2019-12-20 | Disposition: A | Discharge status: Home / Self Care | Payer: Medicare Other | Attending: Emergency Medicine | Admitting: Emergency Medicine

## 2019-12-20 DIAGNOSIS — Z7901 Long term (current) use of anticoagulants: Secondary | ICD-10-CM | POA: Insufficient documentation

## 2019-12-20 DIAGNOSIS — R55 Syncope and collapse: Secondary | ICD-10-CM | POA: Insufficient documentation

## 2019-12-20 DIAGNOSIS — Z743 Need for continuous supervision: Secondary | ICD-10-CM | POA: Diagnosis not present

## 2019-12-20 DIAGNOSIS — E039 Hypothyroidism, unspecified: Secondary | ICD-10-CM | POA: Insufficient documentation

## 2019-12-20 DIAGNOSIS — R1110 Vomiting, unspecified: Secondary | ICD-10-CM | POA: Insufficient documentation

## 2019-12-20 DIAGNOSIS — I2510 Atherosclerotic heart disease of native coronary artery without angina pectoris: Secondary | ICD-10-CM | POA: Diagnosis not present

## 2019-12-20 DIAGNOSIS — Z79899 Other long term (current) drug therapy: Secondary | ICD-10-CM | POA: Diagnosis not present

## 2019-12-20 DIAGNOSIS — I10 Essential (primary) hypertension: Secondary | ICD-10-CM | POA: Insufficient documentation

## 2019-12-20 DIAGNOSIS — R531 Weakness: Secondary | ICD-10-CM | POA: Diagnosis not present

## 2019-12-20 DIAGNOSIS — I447 Left bundle-branch block, unspecified: Secondary | ICD-10-CM | POA: Diagnosis not present

## 2019-12-20 DIAGNOSIS — R9431 Abnormal electrocardiogram [ECG] [EKG]: Secondary | ICD-10-CM | POA: Diagnosis not present

## 2019-12-20 LAB — BASIC METABOLIC PANEL
Anion gap: 10 (ref 5–15)
BUN: 24 mg/dL — ABNORMAL HIGH (ref 8–23)
CO2: 23 mmol/L (ref 22–32)
Calcium: 9.1 mg/dL (ref 8.9–10.3)
Chloride: 98 mmol/L (ref 98–111)
Creatinine, Ser: 1.13 mg/dL — ABNORMAL HIGH (ref 0.44–1.00)
GFR calc Af Amer: 50 mL/min — ABNORMAL LOW (ref >60)
GFR calc non Af Amer: 43 mL/min — ABNORMAL LOW (ref >60)
Glucose, Bld: 148 mg/dL — ABNORMAL HIGH (ref 70–99)
Potassium: 4.1 mmol/L (ref 3.5–5.1)
Sodium: 131 mmol/L — ABNORMAL LOW (ref 135–145)

## 2019-12-20 LAB — URINALYSIS, COMPLETE (UACMP) WITH MICROSCOPIC
Bacteria, UA: NONE SEEN
Bilirubin Urine: NEGATIVE
Glucose, UA: NEGATIVE mg/dL
Hgb urine dipstick: NEGATIVE
Ketones, ur: NEGATIVE mg/dL
Leukocytes,Ua: NEGATIVE
Nitrite: NEGATIVE
Protein, ur: NEGATIVE mg/dL
Specific Gravity, Urine: 1.01 (ref 1.005–1.030)
pH: 5 (ref 5.0–8.0)

## 2019-12-20 LAB — CBC WITH DIFFERENTIAL/PLATELET
Abs Immature Granulocytes: 0.06 10*3/uL (ref 0.00–0.07)
Basophils Absolute: 0.1 10*3/uL (ref 0.0–0.1)
Basophils Relative: 1 %
Eosinophils Absolute: 0.1 10*3/uL (ref 0.0–0.5)
Eosinophils Relative: 1 %
HCT: 38.8 % (ref 36.0–46.0)
Hemoglobin: 12.8 g/dL (ref 12.0–15.0)
Immature Granulocytes: 1 %
Lymphocytes Relative: 18 %
Lymphs Abs: 1.3 10*3/uL (ref 0.7–4.0)
MCH: 31.2 pg (ref 26.0–34.0)
MCHC: 33 g/dL (ref 30.0–36.0)
MCV: 94.6 fL (ref 80.0–100.0)
Monocytes Absolute: 0.9 10*3/uL (ref 0.1–1.0)
Monocytes Relative: 12 %
Neutro Abs: 5 10*3/uL (ref 1.7–7.7)
Neutrophils Relative %: 67 %
Platelets: 207 10*3/uL (ref 150–400)
RBC: 4.1 MIL/uL (ref 3.87–5.11)
RDW: 14.2 % (ref 11.5–15.5)
WBC: 7.5 10*3/uL (ref 4.0–10.5)
nRBC: 0 % (ref 0.0–0.2)

## 2019-12-20 LAB — TROPONIN I (HIGH SENSITIVITY)
Troponin I (High Sensitivity): 8 ng/L (ref <18)
Troponin I (High Sensitivity): 8 ng/L (ref <18)

## 2019-12-20 LAB — LIPASE, BLOOD: Lipase: 19 U/L (ref 11–51)

## 2019-12-20 MED ORDER — SODIUM CHLORIDE 0.9 % IV BOLUS
500.0000 mL | Freq: Once | INTRAVENOUS | Status: AC
  Administered 2019-12-20: 500 mL via INTRAVENOUS

## 2019-12-20 NOTE — ED Notes (Signed)
Provider notified of BP of 185/73. Provider states it is okay and pt can be discharged

## 2019-12-20 NOTE — ED Provider Notes (Signed)
[facility] Emergency Department Provider Note  ____________________________________________   I have reviewed the triage vital signs and the nursing notes.   HISTORY  Chief Complaint Emesis   History limited by: Not Limited   HPI Kayla [name] is a 86 y.o. female who presents to the emergency department today because of concern for a syncopal episode. The patient was at a graduation party for her great grandson. She says that she passed out and then vomited. The patient says that she felt better after vomiting. The patient denies any chest pain or feeling any palpitations during this episode. The patient says that she has passed out before.    Records reviewed. Per medical record review patient has a history of HLD, HTN, hyponatremia.   Past Medical History:  Diagnosis Date  . Anginal pain ([place])   . Arthritis   . Bundle branch block, left    Chronic  . CAD (coronary artery disease)   . Cholecystitis    from record  . Emphysema lung ([place])   . GERD (gastroesophageal reflux disease)   . Heart murmur   . HOH (hard of hearing)   . Hyperlipidemia   . Hypertension   . Hypothyroidism   . Orthostatic headache    Mild  . Pain    HIP AND LEG PAIN USES  CANE  . Vertigo     Patient Active Problem List   Diagnosis Date Noted  . Atrial fibrillation ([place]) 11/26/2019  . Low grade fever 10/20/2019  . Hypertension 01/09/2013    Past Surgical History:  Procedure Laterality Date  . APPENDECTOMY  1957  . BREAST BIOPSY Left 2011   neg, stereo done by Dr. Jamal Collin  . BREAST BIOPSY    . CARDIAC CATHETERIZATION  2000   [facility]  . CATARACT EXTRACTION Right    right 2004, left 2018   . CATARACT EXTRACTION W/PHACO Left 04/12/2017   Procedure: CATARACT EXTRACTION PHACO AND INTRAOCULAR LENS PLACEMENT (IOC);  Surgeon: Leandrew Koyanagi, MD;  Location: [facility] ORS;  Service: Ophthalmology;  Laterality: Left;  [place] 01:09.0AP% 23.7CDE 16.35FLUID PACK LOT # [id number] H  .  CHOLECYSTECTOMY N/A 04/25/2016   Procedure: LAPAROSCOPIC CHOLECYSTECTOMY WITH INTRAOPERATIVE CHOLANGIOGRAM;  Surgeon: Christene Lye, MD;  Location: [facility] ORS;  Service: General;  Laterality: N/A;  . CORONARY ANGIOPLASTY     STENT  . CORONARY ARTERY BYPASS GRAFT  2001  . KNEE SURGERY    . thyroid  1970  . UPPER GI ENDOSCOPY  2014    Prior to Admission medications   Medication Sig Start Date End Date Taking? Authorizing Provider  amLODipine (NORVASC) 10 MG tablet TAKE 1 TABLET BY MOUTH  DAILY Patient taking differently: Take 10 mg by mouth daily.  04/02/19   Burnard Hawthorne, FNP  apixaban (ELIQUIS) 5 MG TABS tablet Take 1 tablet (5 mg total) by mouth 2 (two) times daily. 11/26/19 12/26/19  Burnard Hawthorne, FNP  carvedilol (COREG) 12.5 MG tablet TAKE 1 TABLET BY MOUTH  TWICE DAILY 09/01/19   Burnard Hawthorne, FNP  cloNIDine (CATAPRES) 0.1 MG tablet TAKE 1 TABLET BY MOUTH  TWICE DAILY 09/01/19   Minna Merritts, MD  Cyanocobalamin (VITAMIN B 12 PO) Take 3,000 Units by mouth daily.    [provider]  famotidine (PEPCID) 20 MG tablet TAKE 1 TABLET BY MOUTH  TWICE DAILY 04/02/19   Burnard Hawthorne, FNP  hydrALAZINE (APRESOLINE) 25 MG tablet TAKE 1 TABLET BY MOUTH 3  TIMES DAILY AS NEEDED FOR  SYSTOLIC BLOOD PRESSURE  ABOVE 150 12/04/19   Gollan, Tollie Pizza, MD  levothyroxine (SYNTHROID) 100 MCG tablet TAKE 1 TABLET BY MOUTH  DAILY BEFORE BREAKFAST 04/02/19   Allegra Grana, FNP  losartan (COZAAR) 100 MG tablet TAKE 1 TABLET BY MOUTH  DAILY 02/27/19   Allegra Grana, FNP  nitroGLYCERIN (NITROSTAT) 0.4 MG SL tablet Place 1 tablet (0.4 mg total) under the tongue every 5 (five) minutes as needed for chest pain. 03/21/19   Allegra Grana, FNP  pravastatin (PRAVACHOL) 20 MG tablet TAKE 1 TABLET BY MOUTH AT  BEDTIME 05/20/19   Allegra Grana, FNP    Allergies Bee pollen, Boniva [ibandronic acid], Pollen extract, and Motrin [ibuprofen]  Family History  Problem Relation  Age of Onset  . Heart failure Mother 43  . Heart failure Sister 76  . Heart attack Sister   . Heart attack Brother 37    Social History Social History   Tobacco Use  . Smoking status: Never Smoker  . Smokeless tobacco: Never Used  Substance Use Topics  . Alcohol use: No  . Drug use: No    Review of Systems Constitutional: No fever/chills Eyes: No visual changes. ENT: No sore throat. Cardiovascular: Denies chest pain. Respiratory: Denies shortness of breath. Gastrointestinal: No abdominal pain.  No nausea, no vomiting.  No diarrhea.   Genitourinary: Negative for dysuria. Musculoskeletal: Negative for back pain. Skin: Negative for rash. Neurological: Negative for headaches, focal weakness or numbness.  ____________________________________________   PHYSICAL EXAM:  VITAL SIGNS: ED Triage Vitals  Enc Vitals Group     BP 12/20/19 1928 (!) 178/63     Pulse Rate 12/20/19 1928 70     Resp 12/20/19 1928 17     Temp 12/20/19 1928 98 F (36.7 C)     Temp Source 12/20/19 1928 Oral     SpO2 12/20/19 1928 99 %     Weight 12/20/19 1930 150 lb (68 kg)     Height 12/20/19 1930 5\' 1"  (1.549 m)     Head Circumference --      Peak Flow --      Pain Score 12/20/19 1929 0   Constitutional: Alert and oriented.  Eyes: Conjunctivae are normal.  ENT      Head: Normocephalic and atraumatic.      Nose: No congestion/rhinnorhea.      Mouth/Throat: Mucous membranes are moist.      Neck: No stridor. Hematological/Lymphatic/Immunilogical: No cervical lymphadenopathy. Cardiovascular: Normal rate, regular rhythm.  No murmurs, rubs, or gallops.  Respiratory: Normal respiratory effort without tachypnea nor retractions. Breath sounds are clear and equal bilaterally. No wheezes/rales/rhonchi. Gastrointestinal: Soft and non tender. No rebound. No guarding.  Genitourinary: Deferred Musculoskeletal: Normal range of motion in all extremities. No lower extremity edema. Neurologic:  Normal speech  and language. No gross focal neurologic deficits are appreciated.  Skin:  Skin is warm, dry and intact. No rash noted. Psychiatric: Mood and affect are normal. Speech and behavior are normal. Patient exhibits appropriate insight and judgment.  ____________________________________________    LABS (pertinent positives/negatives)  CBC wbc 7.5, hgb 12.8, plt 207 BMP na 131, k 4.1, glu 148, cr 1.13 Trop hs 8 ____________________________________________   EKG  I, 02/19/20, attending physician, personally viewed and interpreted this EKG  EKG Time: 1924 Rate: 71 Rhythm: ectopic atrial rhythm Axis: left axis deviation Intervals: qtc 478 QRS: IVCD, q waves v1, v2, v3 ST changes: no st elevation Impression: abnormal ekg   ____________________________________________  RADIOLOGY  None  ____________________________________________   PROCEDURES  Procedures  ____________________________________________   INITIAL IMPRESSION / ASSESSMENT AND PLAN / ED COURSE  Pertinent labs & imaging results that were available during my care of the patient were reviewed by me and considered in my medical decision making (see chart for details).   Patient presented to the emergency department today after a syncopal episode.  Patient's blood work without concerning anemia, electrolyte abnormality.  Troponin was negative x2.  Patient did feel better during her stay here in the emergency department.  At this point unclear etiology of the patient's syncope but I have low suspicion for cardiac or pulmonary cause.  Do not think patient suffered a stroke.  At this point do wonder if patient had some he exhaustion or vasovagal episode. Discussed findings with patient.  Will plan on discharging home.  ____________________________________________   FINAL CLINICAL IMPRESSION(S) / ED DIAGNOSES  Final diagnoses:  Syncope, unspecified syncope type     Note: This dictation was prepared with  Dragon dictation. Any transcriptional errors that result from this process are unintentional     Phineas Semen, MD 12/20/19 2257

## 2019-12-20 NOTE — ED Triage Notes (Signed)
Pt BIB EMS after being at a graduation party, out in the sun in the head and having one episode of emisis and a near syncopal event. Pt states she is feeling back to normal and has no nausea. Pt states daughter is to come. Pt ha bilateral pedal edema, which EMS states is a chronic issue, not being medicated. EMS reported a LBBB with EKG.

## 2019-12-20 NOTE — ED Notes (Signed)
Please see triage note. Pt states she has been drinking gateraid today. Pt placed on cardiac, bp and pulse ox monitor.

## 2019-12-20 NOTE — Discharge Instructions (Addendum)
Please seek medical attention for any high fevers, chest pain, shortness of breath, change in behavior, persistent vomiting, bloody stool or any other new or concerning symptoms.  

## 2019-12-20 NOTE — ED Notes (Signed)
EKG given to provider for review

## 2019-12-22 ENCOUNTER — Telehealth: Payer: Self-pay | Admitting: Family

## 2019-12-22 DIAGNOSIS — R7309 Other abnormal glucose: Secondary | ICD-10-CM

## 2019-12-22 NOTE — Telephone Encounter (Signed)
Call pt Forgot to add last week that she would need chest xray due to hyponatremia ( low sodium) I would also like to sch her for a1c, tsh I have ordered; please sch

## 2019-12-23 ENCOUNTER — Telehealth: Payer: Self-pay | Admitting: Family

## 2019-12-23 NOTE — Telephone Encounter (Signed)
Called and scheduled pt

## 2019-12-23 NOTE — Telephone Encounter (Signed)
Pt called requesting an appt asap with Claris Che only for an ED follow up on Syncope

## 2019-12-23 NOTE — Telephone Encounter (Signed)
Call pt Can she come in at 930 tomorrow ? Advise her that I'll work her in as tomorrow I leave for vacation for rest of week

## 2019-12-23 NOTE — Telephone Encounter (Signed)
Patient does not want to see anyone other then you. You do not have an opening until June 15th. I'm unsure of your protocol for this matter.

## 2019-12-23 NOTE — Telephone Encounter (Signed)
Patient has a f/up with you on 12/24/2019, he was in the ER, I did not call him about the other bloodwork or the chest xray, I think theses were addressed at the ER.  Terease Marcotte,cma

## 2019-12-24 ENCOUNTER — Ambulatory Visit (INDEPENDENT_AMBULATORY_CARE_PROVIDER_SITE_OTHER): Payer: Medicare Other | Admitting: Family

## 2019-12-24 ENCOUNTER — Ambulatory Visit: Payer: Medicare Other | Admitting: Nurse Practitioner

## 2019-12-24 ENCOUNTER — Other Ambulatory Visit: Payer: Self-pay

## 2019-12-24 ENCOUNTER — Other Ambulatory Visit
Admission: RE | Admit: 2019-12-24 | Discharge: 2019-12-24 | Disposition: A | Discharge status: Home / Self Care | Payer: Medicare Other | Source: Ambulatory Visit | Attending: Family | Admitting: Family

## 2019-12-24 ENCOUNTER — Encounter: Payer: Self-pay | Admitting: Nurse Practitioner

## 2019-12-24 ENCOUNTER — Encounter: Payer: Self-pay | Admitting: Family

## 2019-12-24 VITALS — Temp 96.2°F | Wt 145.0 lb

## 2019-12-24 VITALS — BP 171/70 | HR 70 | Ht 61.0 in | Wt 145.4 lb

## 2019-12-24 DIAGNOSIS — I10 Essential (primary) hypertension: Secondary | ICD-10-CM | POA: Diagnosis not present

## 2019-12-24 DIAGNOSIS — E785 Hyperlipidemia, unspecified: Secondary | ICD-10-CM

## 2019-12-24 DIAGNOSIS — R0789 Other chest pain: Secondary | ICD-10-CM | POA: Diagnosis not present

## 2019-12-24 DIAGNOSIS — R7309 Other abnormal glucose: Secondary | ICD-10-CM

## 2019-12-24 DIAGNOSIS — R079 Chest pain, unspecified: Secondary | ICD-10-CM | POA: Insufficient documentation

## 2019-12-24 DIAGNOSIS — R55 Syncope and collapse: Secondary | ICD-10-CM

## 2019-12-24 DIAGNOSIS — I951 Orthostatic hypotension: Secondary | ICD-10-CM | POA: Diagnosis not present

## 2019-12-24 DIAGNOSIS — I25119 Atherosclerotic heart disease of native coronary artery with unspecified angina pectoris: Secondary | ICD-10-CM | POA: Diagnosis not present

## 2019-12-24 LAB — HEMOGLOBIN A1C
Hgb A1c MFr Bld: 6.1 % — ABNORMAL HIGH (ref 4.8–5.6)
Mean Plasma Glucose: 128.37 mg/dL

## 2019-12-24 LAB — TSH: TSH: 1.345 u[IU]/mL (ref 0.350–4.500)

## 2019-12-24 LAB — TROPONIN I (HIGH SENSITIVITY): Troponin I (High Sensitivity): 7 ng/L (ref <18)

## 2019-12-24 NOTE — Assessment & Plan Note (Addendum)
Patient orthostatic on exam today.  148/63, 77 sitting 130/68, 63 standing    Discussed at length that I suspect this is contributory to symptoms.  Deferred making medication changes to antihypertensives as patient is seeing cardiology today

## 2019-12-24 NOTE — Assessment & Plan Note (Addendum)
Working diagnosis a vasovagal syncopal episode likely exacerbated by heat, Orthostatic hypotension, dehydration.  I am concerned it may also be related to paroxsmal atrial fibrillation, particularly in the absence of prodromal symptoms. Advised her to drink Gatorade in the setting of hyponatremia and to ensure she is drinking at least 1200 mL of water a day (I suspect she is drinking less).  Interestingly, on the days that she prior when she had drinking Gatorade, she did not felt dizzy, again increasing suspicion that dehydration playing a role.  Education provided with being careful with position changes.

## 2019-12-24 NOTE — Progress Notes (Signed)
Subjective:    Patient ID: Kayla [name], female    DOB: 1926-08-05, 86 y.o.   MRN: [id number]  CC: Kayla [name] is a 86 y.o. female who presents today for follow up.   HPI: Follow up Syncopal episode 5 days ago.  Accompanied by daughter today. Describes that she feels dizzy which started walking down the hall  this morning.endorses fatigue.  Walked yesterday for exercise and felt well.    Drank 2 cups of coffee this morning , couple swallows of water. Hasnt eaten breakfast due to appointment. Drank 40 ounces of water yesterday, and wasn't dizzy yesterday.   No vertigo, tinnitus, congestion, nauseated, vomiting, fever, dysuria.     She also describes pain  under left breast  started this morning at 4 AM, noticed after walking to the bathroom. Had several episodes and would last for a couple of minutes and go away no its own. No exacerbating features. Pain didn't wake her up. Stabbing pain. No rash, lifting anything heavy. No associated diaphoresis , left arm pain, palpitations, sob, orthopnea.  Has nitro but never has taken.  Pain resolved 30 minutes prior to appointment , however recurred while here for her appointment.  Syncopal episode 5 days ago . describes she is a graduation for her great grandson , sitting in walker in shade. Tried to stand, then passed out.  Sitting in chair when episode occur. No head injury.  No prodromal symptoms.   when she passed out , had an episode of vomiting.  She felt better after vomiting. Non bloody emesis. No confusion after syncope. No urination during episode, movement of body reported by bystander.     HTN- Blood pressure today at home  139/79, HR 75.    Chronic BLE swelling, worse in summer time. Had been on HCTZ. Unable to put on compression stockings. Improves with elevation and better in the morning.   Presented emergency room on 6 11/2019 for concern for syncopal episode.  ED 4/30 for lightheadedness.   Syncopal episode September  2020; at that time Imdur was discontinued and she was placed back on amlodipine  11/17/19 Mild aortic stenosis Bradycardia Did not reduce the dose of carvedilol given document atrial fibrillation Paroxysmal atrial fibrillation-hold on starting antiarrhythmics  US carotid - right 40-59%, left 1-39%  Dr Wyn Quaker - 09/19/19-Superior mesenteric artery stenosis HISTORY:  Past Medical History:  Diagnosis Date  . Arthritis   . Bundle branch block, left    Chronic  . CAD (coronary artery disease)    a. 2001 s/p CABG @ [facility]; b. 2009 Neg MV.  . Carotid arterial disease ([facility])    a. 10/2019 Carotid U/S: RICA 40-59, LICA 1-39. F/u 2 yrs.  . Cholecystitis    from record  . Diastolic dysfunction    a. 06/2019 Echo: EF 55-60%, Gr2 DD.  [name] Emphysema lung ([facility])   . GERD (gastroesophageal reflux disease)   . Heart murmur   . HOH (hard of hearing)   . Hyperlipidemia   . Hypertension   . Hypothyroidism   . Orthostatic headache    Mild  . PAF (paroxysmal atrial fibrillation) ([facility])    a. Dx 10/2019 - converted spontaneously to sinus ED; b. CHA2DS2VASc = 5-->Eliquis.  . Pain    HIP AND LEG PAIN USES  CANE  . 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 03/2019 renal duplex w/ incidental finding of celia and SMA stenosis; b. 09/2019 Mesenteric Duplex: Nl Celiac, Hepatic, and Splenic arteries. 70-99%  SMA stenosis-->Seen by vasc surgery-->conservative mgmt given lack of high-grade dzs and symptoms.  . Valvular heart disease    a. 06/2019 Echo: EF 55-60%, Gr2 DD. No rwma. Nl RV fxn. Mild-mod MR. Mild TR/AI/PR. Mild to mod Ao sclerosis w/o stenosis.  . Vertigo    Past Surgical History:  Procedure Laterality Date  . APPENDECTOMY  1957  . BREAST BIOPSY Left 2011   neg, stereo done by Dr. Evette Cristal  . BREAST BIOPSY    . CARDIAC CATHETERIZATION  2000   [facility]  . CATARACT EXTRACTION Right    right 2004, left 2018   . CATARACT EXTRACTION W/PHACO Left 04/12/2017   Procedure: CATARACT EXTRACTION PHACO AND  INTRAOCULAR LENS PLACEMENT (IOC);  Surgeon: Lockie Mola, MD;  Location: [facility] ORS;  Service: Ophthalmology;  Laterality: Left;  [place] 01:09.0AP% 23.7CDE 16.35FLUID PACK LOT # [id number] H  . CHOLECYSTECTOMY N/A 04/25/2016   Procedure: LAPAROSCOPIC CHOLECYSTECTOMY WITH INTRAOPERATIVE CHOLANGIOGRAM;  Surgeon: Kieth Brightly, MD;  Location: [facility] ORS;  Service: General;  Laterality: N/A;  . CORONARY ANGIOPLASTY     STENT  . CORONARY ARTERY BYPASS GRAFT  2001  . KNEE SURGERY    . thyroid  1970  . UPPER GI ENDOSCOPY  2014   Family History  Problem Relation Age of Onset  . Heart failure Mother 16  . Heart failure Sister 38  . Heart attack Sister   . Heart attack Brother 42    Allergies: Bee pollen, Boniva [ibandronic acid], Pollen extract, and Motrin [ibuprofen] Current Outpatient Medications on File Prior to Visit  Medication Sig Dispense Refill  . amLODipine (NORVASC) 10 MG tablet TAKE 1 TABLET BY MOUTH  DAILY (Patient taking differently: Take 10 mg by mouth daily. ) 90 tablet 3  . apixaban (ELIQUIS) 5 MG TABS tablet Take 1 tablet (5 mg total) by mouth 2 (two) times daily. 60 tablet 1  . carvedilol (COREG) 12.5 MG tablet TAKE 1 TABLET BY MOUTH  TWICE DAILY 180 tablet 3  . cloNIDine (CATAPRES) 0.1 MG tablet TAKE 1 TABLET BY MOUTH  TWICE DAILY 180 tablet 3  . Cyanocobalamin (VITAMIN B 12 PO) Take 3,000 Units by mouth daily.    . famotidine (PEPCID) 20 MG tablet TAKE 1 TABLET BY MOUTH  TWICE DAILY 180 tablet 3  . hydrALAZINE (APRESOLINE) 25 MG tablet TAKE 1 TABLET BY MOUTH 3  TIMES DAILY AS NEEDED FOR  SYSTOLIC BLOOD PRESSURE  ABOVE 150 270 tablet 1  . levothyroxine (SYNTHROID) 100 MCG tablet TAKE 1 TABLET BY MOUTH  DAILY BEFORE BREAKFAST 90 tablet 3  . losartan (COZAAR) 100 MG tablet TAKE 1 TABLET BY MOUTH  DAILY 90 tablet 3  . nitroGLYCERIN (NITROSTAT) 0.4 MG SL tablet Place 1 tablet (0.4 mg total) under the tongue every 5 (five) minutes as needed for chest pain. 25 tablet 1  .  pravastatin (PRAVACHOL) 20 MG tablet TAKE 1 TABLET BY MOUTH AT  BEDTIME 90 tablet 3   No current facility-administered medications on file prior to visit.    Social History   Tobacco Use  . Smoking status: Never Smoker  . Smokeless tobacco: Never Used  Substance Use Topics  . Alcohol use: No  . Drug use: No    Review of Systems  Constitutional: Negative for chills and fever.  Eyes: Negative for visual disturbance.  Respiratory: Negative for cough, shortness of breath and wheezing.   Cardiovascular: Positive for chest pain and leg swelling. Negative for palpitations.  Gastrointestinal: Negative for nausea and vomiting.  Neurological: Positive for dizziness and syncope. Negative for weakness and headaches.      Objective:    Temp (!) 96.2 F (35.7 C)   Wt 145 lb (65.8 kg)   SpO2 98%   BMI 27.40 kg/m  BP Readings from Last 3 Encounters:  12/24/19 (!) 171/70  12/20/19 (!) 185/73  11/26/19 (!) 138/58   Wt Readings from Last 3 Encounters:  12/24/19 145 lb 6 oz (65.9 kg)  12/24/19 145 lb (65.8 kg)  12/20/19 150 lb (68 kg)   148/63, 77 sitting 130/68, 63 standing   Physical Exam Vitals reviewed.  Constitutional:      Appearance: She is well-developed.  Eyes:     Conjunctiva/sclera: Conjunctivae normal.  Cardiovascular:     Rate and Rhythm: Normal rate and regular rhythm.     Pulses: Normal pulses.     Heart sounds: Normal heart sounds.     Comments: Trace bilateral edema.  Pulmonary:     Effort: Pulmonary effort is normal.     Breath sounds: Normal breath sounds. No wheezing, rhonchi or rales.  Skin:    General: Skin is warm and dry.  Neurological:     Mental Status: She is alert.  Psychiatric:        Speech: Speech normal.        Behavior: Behavior normal.        Thought Content: Thought content normal.        Assessment & Plan:   Problem List Items Addressed This Visit      Cardiovascular and Mediastinum   Hypertension    Patient orthostatic on  exam today.  148/63, 77 sitting 130/68, 63 standing    Discussed at length that I suspect this is contributory to symptoms.  Deferred making medication changes to antihypertensives as patient is seeing cardiology today      Syncope    Working diagnosis a vasovagal syncopal episode likely exacerbated by heat, Orthostatic hypotension, dehydration.  I am concerned it may also be related to paroxsmal atrial fibrillation, particularly in the absence of prodromal symptoms. Advised her to drink Gatorade in the setting of hyponatremia and to ensure she is drinking at least 1200 mL of water a day (I suspect she is drinking less).  Interestingly, on the days that she prior when she had drinking Gatorade, she did not felt dizzy, again increasing suspicion that dehydration playing a role.  Education provided with being careful with position changes.        Other   Chest pain - Primary    Started earlier this morning after walking to the bathroom, resolved 30 minutes prior to office visit however recurred in the latter part of her office visit.   did not appear musculoskeletal on my exam.  Troponin I negative.  We discussed at great length having patient evaluated in the emergency room due to her complex cardiac history, however we were able to get her cardiology appointment for this afternoon for further evaluation.  Daughter politely declined EMS transport to ED and stated that she would drive mother to her appointment      Relevant Orders   Troponin I (High Sensitivity) (Completed)       I am having Kayla [name] maintain her Cyanocobalamin (VITAMIN B 12 PO), losartan, nitroGLYCERIN, famotidine, amLODipine, levothyroxine, pravastatin, cloNIDine, carvedilol, apixaban, and hydrALAZINE.   No orders of the defined types were placed in this encounter.   Return precautions given.   Risks, benefits, and alternatives of the medications  and treatment plan prescribed today were discussed, and patient  expressed understanding.   Education regarding symptom management and diagnosis given to patient on AVS.  Continue to follow with Burnard Hawthorne, FNP for routine health maintenance.   [name] and I agreed with plan.   Mable Paris, FNP

## 2019-12-24 NOTE — Progress Notes (Signed)
Office Visit    Patient Name: SMANTHA BOAKYE Date of Encounter: 12/24/2019  Primary Care Provider:  Allegra Grana, FNP Primary Cardiologist:  Julien Nordmann, MD  Chief Complaint    86 year old female with a history of CAD status post coronary artery bypass grafting in 2001, left bundle branch block, hypertension, hyperlipidemia, diastolic dysfunction, mild to moderate valvular heart disease, carotid arterial disease, superior mesenteric artery stenosis, and paroxysmal atrial fibrillation, who presents for follow-up related to syncope and chest pain.  Past Medical History    Past Medical History:  Diagnosis Date  . Arthritis   . Bundle branch block, left    Chronic  . CAD (coronary artery disease)    a. 2001 s/p CABG @ [facility]; b. 2009 Neg MV.  . Carotid arterial disease (HCC)    a. 10/2019 Carotid U/S: RICA 40-59, LICA 1-39. F/u 2 yrs.  . Cholecystitis    from record  . Diastolic dysfunction    a. 06/2019 Echo: EF 55-60%, Gr2 DD.  [name] Emphysema lung (HCC)   . GERD (gastroesophageal reflux disease)   . Heart murmur   . HOH (hard of hearing)   . Hyperlipidemia   . Hypertension   . Hypothyroidism   . Orthostatic headache    Mild  . PAF (paroxysmal atrial fibrillation) (HCC)    a. Dx 10/2019 - converted spontaneously to sinus ED; b. CHA2DS2VASc = 5-->Eliquis.  . Pain    HIP AND LEG PAIN USES  CANE  . 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 03/2019 renal duplex w/ incidental finding of celia and SMA stenosis; b. 09/2019 Mesenteric Duplex: Nl Celiac, Hepatic, and Splenic arteries. 70-99% SMA stenosis-->Seen by vasc surgery-->conservative mgmt given lack of high-grade dzs and symptoms.  . Valvular heart disease    a. 06/2019 Echo: EF 55-60%, Gr2 DD. No rwma. Nl RV fxn. Mild-mod MR. Mild TR/AI/PR. Mild to mod Ao sclerosis w/o stenosis.  . Vertigo    Past Surgical History:  Procedure Laterality Date  . APPENDECTOMY  1957  . BREAST BIOPSY Left 2011   neg, stereo done  by Dr. Evette Cristal  . BREAST BIOPSY    . CARDIAC CATHETERIZATION  2000   [facility]  . CATARACT EXTRACTION Right    right 2004, left 2018   . CATARACT EXTRACTION W/PHACO Left 04/12/2017   Procedure: CATARACT EXTRACTION PHACO AND INTRAOCULAR LENS PLACEMENT (IOC);  Surgeon: Lockie Mola, MD;  Location: [facility] ORS;  Service: Ophthalmology;  Laterality: Left;  [place] 01:09.0AP% 23.7CDE 16.35FLUID PACK LOT # [id number]  . CHOLECYSTECTOMY N/A 04/25/2016   Procedure: LAPAROSCOPIC CHOLECYSTECTOMY WITH INTRAOPERATIVE CHOLANGIOGRAM;  Surgeon: Kieth Brightly, MD;  Location: [facility] ORS;  Service: General;  Laterality: N/A;  . CORONARY ANGIOPLASTY     STENT  . CORONARY ARTERY BYPASS GRAFT  2001  . KNEE SURGERY    . thyroid  1970  . UPPER GI ENDOSCOPY  2014    Allergies  Allergies  Allergen Reactions  . Bee Pollen     Other reaction(s): Other (See Comments)  . Boniva [Ibandronic Acid]     Jaw pain  . Pollen Extract   . Motrin [Ibuprofen] Rash    History of Present Illness    86 year old female with the above complex past medical history including coronary artery disease status post CABG in 2001 at [facility], left bundle branch block, hypertension, hyperlipidemia, diastolic dysfunction, mild to moderate valvular heart disease, carotid arterial disease, superior mesenteric artery stenosis (70 to 99%-followed by vascular surgery),  and paroxysmal atrial fibrillation.  Her last stress test was in 2019, and was nonischemic.  Most recent echo was performed in December 2020 showing normal LV function, grade 2 diastolic dysfunction, and mild to moderate valvular heart disease.    In late April, she was experiencing lightheadedness and orthostasis and was seen in the emergency room on April 30.  She was found to be in atrial fibrillation, which was a new finding.  Aspirin was discontinued and she was placed on Eliquis.  She did convert to sinus rhythm while in the ER.  She was seen in follow-up on May 3, at which time  she was maintaining sinus rhythm.  No changes were made to her medical therapy.  Unfortunately, on June 5, she was at her great grandsons graduation party and suffered an episode of syncope.  She says that she was sitting in the shade and suddenly felt lightheaded and warm.  Symptoms persisted for about 5 minutes prior to losing consciousness.  She believes she was without consciousness for just a short period of time and when she regained consciousness, she felt nauseated and vomited x1.  With this, she felt better.  Of note, she says that she is aware that she did not hydrate appropriately that day and only had 1 bottle of water with lunch despite being outside in the heat and humidity all day.  She was evaluated in the emergency department where she was hypertensive at 178/63.  High-sensitivity troponin was normal x2.  Creatinine was mildly elevated above baseline at 1.13.  It was felt that syncope was most likely secondary to a vasovagal episode and she was discharged home.  Since her ER visit, she has not had any recurrent syncope.  She does have chronic, stable and intermittent lightheadedness with standing.  This morning, she awoke at approximately 4 AM and noted up to 9/10 midsternal chest discomfort without associated symptoms.  Pain would come and go in spurts of 1 to 20 minutes, and resolve spontaneously before returning.  Pain did not seem to be any worse with ambulating in her home, palpation, deep breathing, or position changes.  She was scheduled for follow-up with primary care at 930 and her daughter came to get her at 46 AM, at which time Ms. Braddy thinks her discomfort had resolved, as she did not mention it to her daughter.  At the primary care office, she reported symptoms.  She was noted to be mildly orthostatic by blood pressure with a drop from 148-130 systolic.  After discussion with our office, a high-sensitivity troponin was sent off and returned normal at 7.  She has had no recurrent  chest discomfort since earlier this morning.  She has chronic, stable dyspnea on exertion and denies PND, orthopnea, or early satiety.  She does dependent ankle edema.  Home Medications    Prior to Admission medications   Medication Sig Start Date End Date Taking? Authorizing Provider  amLODipine (NORVASC) 10 MG tablet TAKE 1 TABLET BY MOUTH  DAILY Patient taking differently: Take 10 mg by mouth daily.  04/02/19   Allegra Grana, FNP  apixaban (ELIQUIS) 5 MG TABS tablet Take 1 tablet (5 mg total) by mouth 2 (two) times daily. 11/26/19 12/26/19  Allegra Grana, FNP  carvedilol (COREG) 12.5 MG tablet TAKE 1 TABLET BY MOUTH  TWICE DAILY 09/01/19   Allegra Grana, FNP  cloNIDine (CATAPRES) 0.1 MG tablet TAKE 1 TABLET BY MOUTH  TWICE DAILY 09/01/19   Antonieta Iba,  MD  Cyanocobalamin (VITAMIN B 12 PO) Take 3,000 Units by mouth daily.    [provider]  famotidine (PEPCID) 20 MG tablet TAKE 1 TABLET BY MOUTH  TWICE DAILY 04/02/19   Burnard Hawthorne, FNP  hydrALAZINE (APRESOLINE) 25 MG tablet TAKE 1 TABLET BY MOUTH 3  TIMES DAILY AS NEEDED FOR  SYSTOLIC BLOOD PRESSURE  ABOVE 150 12/04/19   Gollan, Kathlene November, MD  levothyroxine (SYNTHROID) 100 MCG tablet TAKE 1 TABLET BY MOUTH  DAILY BEFORE BREAKFAST 04/02/19   Burnard Hawthorne, FNP  losartan (COZAAR) 100 MG tablet TAKE 1 TABLET BY MOUTH  DAILY 02/27/19   Burnard Hawthorne, FNP  nitroGLYCERIN (NITROSTAT) 0.4 MG SL tablet Place 1 tablet (0.4 mg total) under the tongue every 5 (five) minutes as needed for chest pain. 03/21/19   Burnard Hawthorne, FNP  pravastatin (PRAVACHOL) 20 MG tablet TAKE 1 TABLET BY MOUTH AT  BEDTIME 05/20/19   Burnard Hawthorne, FNP    Review of Systems    Syncopal episode last weekend.  Episodic chest pain this morning.  Chronic, stable dyspnea on exertion.  Chronic mild dependent edema.  She denies palpitations, PND, orthopnea, or early satiety.  All other systems reviewed and are otherwise negative except as  noted above.  Physical Exam    VS:  BP (!) 171/70 (BP Location: Left Arm, Patient Position: Sitting, Cuff Size: Normal)   Pulse 70   Ht 5\' 1"  (1.549 m)   Wt 145 lb 6 oz (65.9 kg)   SpO2 98%   BMI 27.47 kg/m  , BMI Body mass index is 27.47 kg/m.  Orthostatic VS for the past 24 hrs:  BP- Lying Pulse- Lying BP- Sitting Pulse- Sitting BP- Standing at 0 minutes Pulse- Standing at 0 minutes  12/24/19 1336 170/68 58 170/50 65 147/69 71   GEN: Well nourished, well developed, in no acute distress. HEENT: normal. Neck: Supple, no JVD, carotid bruits, or masses. Cardiac: RRR, 2/6 systolic murmur at upper sternal borders.  No rubs, or gallops. No clubbing, cyanosis, trace bilateral ankle edema just above her sock line..  Radials/PT 1+ and equal bilaterally.  Respiratory:  Respirations regular and unlabored, clear to auscultation bilaterally. GI: Soft, nontender, nondistended, BS + x 4. MS: no deformity or atrophy. Skin: warm and dry, no rash. Neuro:  Strength and sensation are intact. Psych: Normal affect.  Accessory Clinical Findings    ECG personally reviewed by me today -ECG performed at primary care at 928 this morning shows sinus bradycardia, 57, left axis deviation, left bundle branch block.  No acute changes. ECG in our office shows RSR, 70, PACs, and PVC, Right axis, which I suspect is secondary to arm lead reversal.    Lab Results  Component Value Date   WBC 7.5 12/20/2019   HGB 12.8 12/20/2019   HCT 38.8 12/20/2019   MCV 94.6 12/20/2019   PLT 207 12/20/2019   Lab Results  Component Value Date   CREATININE 1.13 (H) 12/20/2019   BUN 24 (H) 12/20/2019   NA 131 (L) 12/20/2019   K 4.1 12/20/2019   CL 98 12/20/2019   CO2 23 12/20/2019   Lab Results  Component Value Date   ALT 12 10/21/2019   ALT 11 10/21/2019   AST 15 10/21/2019   AST 15 10/21/2019   ALKPHOS 42 10/21/2019   ALKPHOS 42 10/21/2019   BILITOT 1.3 (H) 10/21/2019   BILITOT 1.1 10/21/2019   Lab Results    Component Value Date  CHOL 114 07/29/2018   HDL 54.60 07/29/2018   LDLCALC 45 07/29/2018   TRIG 68.0 07/29/2018   CHOLHDL 2 07/29/2018    Lab Results  Component Value Date   HGBA1C 6.1 (H) 12/24/2019    Assessment & Plan    1.  Midsternal chest pain/CAD: Patient with a history of CABG in 2001 and negative Myoview in 2009.  She had episodic chest discomfort this morning without associated symptoms.  ECG without acute changes in the setting of a known left bundle branch block.  High-sensitivity troponin drawn by primary care earlier this morning is normal, despite approximately 5 hours of relatively persistent chest discomfort.  She has not had any recurrent chest discomfort since about 9:30 AM.  She lives with her daughter and is fairly functional.  We discussed options for management and will pursue a Lexiscan Myoview to rule out ischemia.  She remains on beta-blocker, ARB, and statin therapy.  No aspirin in the setting of Eliquis.  2.  Syncope: This occurred on June 5 in the setting of sitting outside for much of the day and not adequately hydrating.  Lightheadedness started while sitting in the shade and within about 5 minutes, she says she lost consciousness.  When she regained consciousness, she was nauseated and vomited x1 with resolution of symptoms.  In the emergency department, her creatinine was mildly elevated above baseline suggesting mild dehydration.  ECG was without acute changes and high-sensitivity troponins were normal.  Symptoms suggest vasovagal syncope in the setting of dehydration.  We did discuss possibly placing an event monitor however, patient just did this last fall and is not interested in pursuing monitoring at this time.  Echo in December 2020 showed normal LV function.  Carotid ultrasound in April showed stable, mild disease.  3.  Essential hypertension with orthostasis: Patient's resting blood pressure elevated at 171/70 however, she does report a somewhat long  history of intermittent orthostatic lightheadedness when standing.  Her blood pressure did drop today to a low of 126/73 while standing.  This was associated with lightheadedness.  Given hypertension at baseline, I am reluctant to reduce her antihypertensive therapy or add something such as midodrine or Florinef.  I think it is notable that though hydralazine is on her list, she believes she is only ever used it 3 times in her life.  Likewise, despite baseline hypertension, I will not increase antihypertensive therapy, which I suspect will only worsen orthostatic blood pressures.  I think she will best be served with conservative measures such as being more deliberate when adjusting position, and also wearing knee-high compression socks if possible.  She says she has tried these before and has had trouble getting them on.  She does have a neighbor who is offered to help her to get them on.  Finally, it appears that she is prone to dehydration and thus I reiterated the need for regular fluid intake.  Patient and daughter were agreeable with this plan.  4.  Hyperlipidemia: Continue statin therapy.  LDL was 45 last year.  5.  Disposition: Follow-up stress testing as outlined above.  Follow-up in clinic in 1 month or sooner if necessary.  Nicolasa Ducking, NP 12/24/2019, 5:51 PM

## 2019-12-24 NOTE — Telephone Encounter (Signed)
Labs collected at hospital CXR done 10/21/19

## 2019-12-24 NOTE — Patient Instructions (Signed)
Medication Instructions:  Your physician recommends that you continue on your current medications as directed. Please refer to the Current Medication list given to you today.  *If you need a refill on your cardiac medications before your next appointment, please call your pharmacy*   Lab Work: None ordered If you have labs (blood work) drawn today and your tests are completely normal, you will receive your results only by: [name] MyChart Message (if you have MyChart) OR . A paper copy in the mail If you have any lab test that is abnormal or we need to change your treatment, we will call you to review the results.   Testing/Procedures: 1- [place]  Your caregiver has ordered a Stress Test with nuclear imaging. The purpose of this test is to evaluate the blood supply to your heart muscle. This procedure is referred to as a "Non-Invasive Stress Test." This is because other than having an IV started in your vein, nothing is inserted or "invades" your body. Cardiac stress tests are done to find areas of poor blood flow to the heart by determining the extent of coronary artery disease (CAD). Some patients exercise on a treadmill, which naturally increases the blood flow to your heart, while others who are  unable to walk on a treadmill due to physical limitations have a pharmacologic/chemical stress agent called Lexiscan . This medicine will mimic walking on a treadmill by temporarily increasing your coronary blood flow.   Please note: these test may take anywhere between 2-4 hours to complete  PLEASE REPORT TO [place] AT THE FIRST DESK WILL DIRECT YOU WHERE TO GO  Date of Procedure:_____________________________________  Arrival Time for Procedure:______________________________  Instructions regarding medication:   ____ : Hold diabetes medication morning of procedure  ____:  Hold betablocker(s) night before procedure and morning of procedure  ____:  Hold  other medications as follows:_________________________________________________________________________________________________________________________________________________________________________________________________________________________________________________________________________________________  PLEASE NOTIFY THE OFFICE AT LEAST 24 HOURS IN ADVANCE IF YOU ARE UNABLE TO KEEP YOUR APPOINTMENT.  [phone] AND  PLEASE NOTIFY NUCLEAR MEDICINE AT [facility] AT LEAST 24 HOURS IN ADVANCE IF YOU ARE UNABLE TO KEEP YOUR APPOINTMENT. [phone]  How to prepare for your Myoview test:  1. Do not eat or drink after midnight 2. No caffeine for 24 hours prior to test 3. No smoking 24 hours prior to test. 4. Your medication may be taken with water.  If your doctor stopped a medication because of this test, do not take that medication. 5. Ladies, please do not wear dresses.  Skirts or pants are appropriate. Please wear a short sleeve shirt. 6. No perfume, cologne or lotion. 7. Wear comfortable walking shoes. No heels!   Follow-Up: At [facility], you and your health needs are our priority.  As part of our continuing mission to provide you with exceptional heart care, we have created designated Provider Care Teams.  These Care Teams include your primary Cardiologist (physician) and Advanced Practice Providers (APPs -  Physician Assistants and Nurse Practitioners) who all work together to provide you with the care you need, when you need it.  We recommend signing up for the patient portal called "MyChart".  Sign up information is provided on this After Visit Summary.  MyChart is used to connect with patients for Virtual Visits (Telemedicine).  Patients are able to view lab/test results, encounter notes, upcoming appointments, etc.  Non-urgent messages can be sent to your provider as well.   To learn more about what you can  do with MyChart, go to [email or web address].    Your next appointment:    1 month(s)  The format for your next appointment:   In Person  Provider:    You may see Julien Nordmann, MD or Nicolasa Ducking, NP

## 2019-12-24 NOTE — Assessment & Plan Note (Signed)
Started earlier this morning after walking to the bathroom, resolved 30 minutes prior to office visit however recurred in the latter part of her office visit.   did not appear musculoskeletal on my exam.  Troponin I negative.  We discussed at great length having patient evaluated in the emergency room due to her complex cardiac history, however we were able to get her cardiology appointment for this afternoon for further evaluation.  Daughter politely declined EMS transport to ED and stated that she would drive mother to her appointment

## 2019-12-24 NOTE — Patient Instructions (Addendum)
As discussed, you are orthostatic.  Please be careful with position changes and stay hydrated.  please ensure you continue to drink Gatorade.  Because of your low sodium however I would like for you to limit fluid intake to 1226ml/ day.     After consulting with Dr. Rockey Situ, he felt comfortable with EKG and advised to do a stat lab error [facility].  He also advised you to take nitroglycerin tablet  only if laying down and if chest pain is persistent   We are making an appointment with Dr Rockey Situ for you, today at [address] [facility] [place]      Nonspecific Chest Pain Chest pain can be caused by many different conditions. Some causes of chest pain can be life-threatening. These will require treatment right away. Serious causes of chest pain include:  Heart attack.  A tear in the body's main blood vessel.  Redness and swelling (inflammation) around your heart.  Blood clot in your lungs. Other causes of chest pain may not be so serious. These include:  Heartburn.  Anxiety or stress.  Damage to bones or muscles in your chest.  Lung infections. Chest pain can feel like:  Pain or discomfort in your chest.  Crushing, pressure, aching, or squeezing pain.  Burning or tingling.  Dull or sharp pain that is worse when you move, cough, or take a deep breath.  Pain or discomfort that is also felt in your back, neck, jaw, shoulder, or arm, or pain that spreads to any of these areas. It is hard to know whether your pain is caused by something that is serious or something that is not so serious. So it is important to see your doctor right away if you have chest pain. Follow these instructions at home: Medicines  Take over-the-counter and prescription medicines only as told by your doctor.  If you were prescribed an antibiotic medicine, take it as told by your doctor. Do not stop taking the antibiotic even if you start to feel better. Lifestyle   Rest as told by your  doctor.  Do not use any products that contain nicotine or tobacco, such as cigarettes, e-cigarettes, and chewing tobacco. If you need help quitting, ask your doctor.  Do not drink alcohol.  Make lifestyle changes as told by your doctor. These may include: ? Getting regular exercise. Ask your doctor what activities are safe for you. ? Eating a heart-healthy diet. A diet and nutrition specialist (dietitian) can help you to learn healthy eating options. ? Staying at a healthy weight. ? Treating diabetes or high blood pressure, if needed. ? Lowering your stress. Activities such as yoga and relaxation techniques can help. General instructions  Pay attention to any changes in your symptoms. Tell your doctor about them or any new symptoms.  Avoid any activities that cause chest pain.  Keep all follow-up visits as told by your doctor. This is important. You may need more testing if your chest pain does not go away. Contact a doctor if:  Your chest pain does not go away.  You feel depressed.  You have a fever. Get help right away if:  Your chest pain is worse.  You have a cough that gets worse, or you cough up blood.  You have very bad (severe) pain in your belly (abdomen).  You pass out (faint).  You have either of these for no clear reason: ? Sudden chest discomfort. ? Sudden discomfort in your arms, back, neck, or jaw.  You have shortness of breath at any time.  You suddenly start to sweat, or your skin gets clammy.  You feel sick to your stomach (nauseous).  You throw up (vomit).  You suddenly feel lightheaded or dizzy.  You feel very weak or tired.  Your heart starts to beat fast, or it feels like it is skipping beats. These symptoms may be an emergency. Do not wait to see if the symptoms will go away. Get medical help right away. Call your local emergency services (911 in the [place] Do not drive yourself to the hospital. Summary  Chest pain can be caused by many  different conditions. The cause may be serious and need treatment right away. If you have chest pain, see your doctor right away.  Follow your doctor's instructions for taking medicines and making lifestyle changes.  Keep all follow-up visits as told by your doctor. This includes visits for any further testing if your chest pain does not go away.  Be sure to know the signs that show that your condition has become worse. Get help right away if you have these symptoms. This information is not intended to replace advice given to you by your health care provider. Make sure you discuss any questions you have with your health care provider. Document Revised: 01/03/2018 Document Reviewed: 01/03/2018 Elsevier Patient Education  2020 Elsevier Inc.  Orthostatic Hypotension Blood pressure is a measurement of how strongly, or weakly, your blood is pressing against the walls of your arteries. Orthostatic hypotension is a sudden drop in blood pressure that happens when you quickly change positions, such as when you get up from sitting or lying down. Arteries are blood vessels that carry blood from your heart throughout your body. When blood pressure is too low, you may not get enough blood to your brain or to the rest of your organs. This can cause weakness, light-headedness, rapid heartbeat, and fainting. This can last for just a few seconds or for up to a few minutes. Orthostatic hypotension is usually not a serious problem. However, if it happens frequently or gets worse, it may be a sign of something more serious. What are the causes? This condition may be caused by:  Sudden changes in posture, such as standing up quickly after you have been sitting or lying down.  Blood loss.  Loss of body fluids (dehydration).  Heart problems.  Hormone (endocrine) problems.  Pregnancy.  Severe infection.  Lack of certain nutrients.  Severe allergic reactions (anaphylaxis).  Certain medicines, such as  blood pressure medicine or medicines that make the body lose excess fluids (diuretics). Sometimes, this condition can be caused by not taking medicine as directed, such as taking too much of a certain medicine. What increases the risk? The following factors may make you more likely to develop this condition:  Age. Risk increases as you get older.  Conditions that affect the heart or the central nervous system.  Taking certain medicines, such as blood pressure medicine or diuretics.  Being pregnant. What are the signs or symptoms? Symptoms of this condition may include:  Weakness.  Light-headedness.  Dizziness.  Blurred vision.  Fatigue.  Rapid heartbeat.  Fainting, in severe cases. How is this diagnosed? This condition is diagnosed based on:  Your medical history.  Your symptoms.  Your blood pressure measurement. Your health care provider will check your blood pressure when you are: ? Lying down. ? Sitting. ? Standing. A blood pressure reading is recorded as two numbers, such as "120  over 80" (or 120/80). The first ("top") number is called the systolic pressure. It is a measure of the pressure in your arteries as your heart beats. The second ("bottom") number is called the diastolic pressure. It is a measure of the pressure in your arteries when your heart relaxes between beats. Blood pressure is measured in a unit called mm Hg. Healthy blood pressure for most adults is 120/80. If your blood pressure is below 90/60, you may be diagnosed with hypotension. Other information or tests that may be used to diagnose orthostatic hypotension include:  Your other vital signs, such as your heart rate and temperature.  Blood tests.  Tilt table test. For this test, you will be safely secured to a table that moves you from a lying position to an upright position. Your heart rhythm and blood pressure will be monitored during the test. How is this treated? This condition may be treated  by:  Changing your diet. This may involve eating more salt (sodium) or drinking more water.  Taking medicines to raise your blood pressure.  Changing the dosage of certain medicines you are taking that might be lowering your blood pressure.  Wearing compression stockings. These stockings help to prevent blood clots and reduce swelling in your legs. In some cases, you may need to go to the hospital for:  Fluid replacement. This means you will receive fluids through an IV.  Blood replacement. This means you will receive donated blood through an IV (transfusion).  Treating an infection or heart problems, if this applies.  Monitoring. You may need to be monitored while medicines that you are taking wear off. Follow these instructions at home: Eating and drinking   Drink enough fluid to keep your urine pale yellow.  Eat a healthy diet, and follow instructions from your health care provider about eating or drinking restrictions. A healthy diet includes: ? Fresh fruits and vegetables. ? Whole grains. ? Lean meats. ? Low-fat dairy products.  Eat extra salt only as directed. Do not add extra salt to your diet unless your health care provider told you to do that.  Eat frequent, small meals.  Avoid standing up suddenly after eating. Medicines  Take over-the-counter and prescription medicines only as told by your health care provider. ? Follow instructions from your health care provider about changing the dosage of your current medicines, if this applies. ? Do not stop or adjust any of your medicines on your own. General instructions   Wear compression stockings as told by your health care provider.  Get up slowly from lying down or sitting positions. This gives your blood pressure a chance to adjust.  Avoid hot showers and excessive heat as directed by your health care provider.  Return to your normal activities as told by your health care provider. Ask your health care provider  what activities are safe for you.  Do not use any products that contain nicotine or tobacco, such as cigarettes, e-cigarettes, and chewing tobacco. If you need help quitting, ask your health care provider.  Keep all follow-up visits as told by your health care provider. This is important. Contact a health care provider if you:  Vomit.  Have diarrhea.  Have a fever for more than 2-3 days.  Feel more thirsty than usual.  Feel weak and tired. Get help right away if you:  Have chest pain.  Have a fast or irregular heartbeat.  Develop numbness in any part of your body.  Cannot move your arms or  your legs.  Have trouble speaking.  Become sweaty or feel light-headed.  Faint.  Feel short of breath.  Have trouble staying awake.  Feel confused. Summary  Orthostatic hypotension is a sudden drop in blood pressure that happens when you quickly change positions.  Orthostatic hypotension is usually not a serious problem.  It is diagnosed by having your blood pressure taken lying down, sitting, and then standing.  It may be treated by changing your diet or adjusting your medicines. This information is not intended to replace advice given to you by your health care provider. Make sure you discuss any questions you have with your health care provider. Document Revised: 12/27/2017 Document Reviewed: 12/27/2017 Elsevier Patient Education  2020 [employer].

## 2019-12-25 DIAGNOSIS — I2510 Atherosclerotic heart disease of native coronary artery without angina pectoris: Secondary | ICD-10-CM | POA: Diagnosis not present

## 2019-12-25 DIAGNOSIS — I10 Essential (primary) hypertension: Secondary | ICD-10-CM | POA: Diagnosis not present

## 2019-12-25 DIAGNOSIS — R262 Difficulty in walking, not elsewhere classified: Secondary | ICD-10-CM | POA: Diagnosis not present

## 2019-12-25 DIAGNOSIS — Z7901 Long term (current) use of anticoagulants: Secondary | ICD-10-CM | POA: Diagnosis not present

## 2019-12-25 DIAGNOSIS — M170 Bilateral primary osteoarthritis of knee: Secondary | ICD-10-CM | POA: Diagnosis not present

## 2019-12-30 ENCOUNTER — Ambulatory Visit: Payer: Medicare Other | Admitting: Dermatology

## 2019-12-30 ENCOUNTER — Other Ambulatory Visit: Payer: Self-pay

## 2019-12-30 DIAGNOSIS — L821 Other seborrheic keratosis: Secondary | ICD-10-CM | POA: Diagnosis not present

## 2019-12-30 DIAGNOSIS — L578 Other skin changes due to chronic exposure to nonionizing radiation: Secondary | ICD-10-CM | POA: Diagnosis not present

## 2019-12-30 DIAGNOSIS — D692 Other nonthrombocytopenic purpura: Secondary | ICD-10-CM | POA: Diagnosis not present

## 2019-12-30 DIAGNOSIS — L570 Actinic keratosis: Secondary | ICD-10-CM | POA: Diagnosis not present

## 2019-12-30 NOTE — Progress Notes (Signed)
   Follow-Up Visit   Subjective  Kayla [name] is a 86 y.o. female who presents for the following: Skin Problem.  Patient here today for a spot on the nose. Present for about 3 years and patient advises it was treated with LN2 in the past and cleared up. It became sore about 4 weeks ago when she made the appt but is better now, just feels rough.   The following portions of the chart were reviewed this encounter and updated as appropriate:      Review of Systems:  No other skin or systemic complaints except as noted in HPI or Assessment and Plan.  Objective  Well appearing patient in no apparent distress; mood and affect are within normal limits.  A focused examination was performed including face. Relevant physical exam findings are noted in the Assessment and Plan.  Objective  Right Nasal Dorsum x 1: Keratotic thin pink papule with gritty scale.   Objective  Bilateral arms: Violaceous macules and patches.    Assessment & Plan  AK (actinic keratosis) Right Nasal Dorsum x 1  Destruction of lesion - Right Nasal Dorsum x 1 Complexity: simple   Destruction method: cryotherapy   Informed consent: discussed and consent obtained   Lesion destroyed using liquid nitrogen: Yes   Region frozen until ice ball extended beyond lesion: Yes   Outcome: patient tolerated procedure well with no complications   Post-procedure details: wound care instructions given    Senile purpura (HCC) Bilateral arms  Benign, observe.     Actinic Damage - diffuse scaly erythematous macules with underlying dyspigmentation - Recommend daily broad spectrum sunscreen SPF 30+ to sun-exposed areas, reapply every 2 hours as needed.  - Call for new or changing lesions.  Seborrheic Keratoses - Stuck-on, waxy, tan-brown papules and plaques  - Discussed benign etiology and prognosis. - Observe - Call for any changes  Return if symptoms worsen or fail to improve.  Anise Salvo, RMA, am acting as  scribe for Willeen Niece, MD .  Documentation: I have reviewed the above documentation for accuracy and completeness, and I agree with the above.  Willeen Niece MD

## 2019-12-30 NOTE — Patient Instructions (Signed)
Recommend daily broad spectrum sunscreen SPF 30+ to sun-exposed areas, reapply every 2 hours as needed. Call for new or changing lesions.  Cryotherapy Aftercare  . Wash gently with soap and water everyday.   . Apply Vaseline and Band-Aid daily until healed.  

## 2019-12-31 ENCOUNTER — Encounter
Admission: RE | Admit: 2019-12-31 | Discharge: 2019-12-31 | Disposition: A | Discharge status: Home / Self Care | Payer: Medicare Other | Source: Ambulatory Visit | Attending: Nurse Practitioner | Admitting: Nurse Practitioner

## 2019-12-31 ENCOUNTER — Other Ambulatory Visit: Payer: Self-pay

## 2019-12-31 DIAGNOSIS — R0789 Other chest pain: Secondary | ICD-10-CM

## 2019-12-31 LAB — NM MYOCAR MULTI W/SPECT W/WALL MOTION / EF
Estimated workload: 1 METS
Exercise duration (min): 0 min
Exercise duration (sec): 0 s
LV dias vol: 54 mL (ref 46–106)
LV sys vol: 15 mL
MPHR: 130 {beats}/min
Peak HR: 86 {beats}/min
Percent HR: 66 %
Rest HR: 65 {beats}/min
SDS: 1
SRS: 9
SSS: 4
TID: 1.09

## 2019-12-31 MED ORDER — REGADENOSON 0.4 MG/5ML IV SOLN
0.4000 mg | Freq: Once | INTRAVENOUS | Status: AC
  Administered 2019-12-31: 0.4 mg via INTRAVENOUS
  Filled 2019-12-31: qty 5

## 2019-12-31 MED ORDER — TECHNETIUM TC 99M TETROFOSMIN IV KIT
30.0000 | PACK | Freq: Once | INTRAVENOUS | Status: AC | PRN
  Administered 2019-12-31: 31.595 via INTRAVENOUS

## 2019-12-31 MED ORDER — TECHNETIUM TC 99M TETROFOSMIN IV KIT
10.1700 | PACK | Freq: Once | INTRAVENOUS | Status: AC | PRN
  Administered 2019-12-31: 10.17 via INTRAVENOUS

## 2020-01-01 ENCOUNTER — Telehealth: Payer: Self-pay

## 2020-01-01 NOTE — Telephone Encounter (Signed)
Call to patient to review stress test results.    Pt verbalized understanding and has no further questions at this time.    Advised pt to call for any further questions or concerns.  No further orders.  Confirmed appt with Ward Givens, NP next month.

## 2020-01-01 NOTE — Telephone Encounter (Signed)
-----   Message from Creig Hines, NP sent at 12/31/2019  3:32 PM EDT ----- Stress test is normal and shows good perfusion of heart muscle.  Very reassuring.

## 2020-01-05 ENCOUNTER — Telehealth (INDEPENDENT_AMBULATORY_CARE_PROVIDER_SITE_OTHER): Payer: Self-pay | Admitting: Vascular Surgery

## 2020-01-05 NOTE — Telephone Encounter (Signed)
Bring her in with mesenteric duplex.[name] KitchenMarland KitchenFBrown or dew

## 2020-01-05 NOTE — Telephone Encounter (Signed)
Called stating that every time that she eats her stomach hurts and she says the pain is very intense. She was last seen 09-19-19 with Mesenteric studies seen by JD. She would like to come in to be seen soon. Please advise.

## 2020-01-05 NOTE — Telephone Encounter (Signed)
Could you please schedule the pt for  the test mentioned below per NP Manson Passey.

## 2020-01-05 NOTE — Telephone Encounter (Signed)
Pt was last seen for  F/U for SMA stenosis on 09/19/2019 . The pt was having a fair amount of nausea and some intermittent abdominal pain unrelated to eating. The mesenteric duplex shows SMA velocities with 70-99% stenosis  Dr. Wyn Quaker recommended that in 6 months she F/U and no intervention unless she develops clear symptoms of mesenteric ischemia.I spoke with the pt and she said she is having pain after she eats her  meals and the pain has been going on for about a month she is having no swelling , wounds, or discolorations in the area.

## 2020-01-07 ENCOUNTER — Encounter (INDEPENDENT_AMBULATORY_CARE_PROVIDER_SITE_OTHER): Payer: Self-pay | Admitting: Nurse Practitioner

## 2020-01-07 ENCOUNTER — Other Ambulatory Visit: Payer: Self-pay

## 2020-01-07 ENCOUNTER — Encounter (INDEPENDENT_AMBULATORY_CARE_PROVIDER_SITE_OTHER): Payer: Medicare Other

## 2020-01-07 ENCOUNTER — Ambulatory Visit (INDEPENDENT_AMBULATORY_CARE_PROVIDER_SITE_OTHER): Payer: Medicare Other | Admitting: Nurse Practitioner

## 2020-01-07 VITALS — BP 163/63 | HR 54 | Ht 61.0 in | Wt 144.0 lb

## 2020-01-07 DIAGNOSIS — K551 Chronic vascular disorders of intestine: Secondary | ICD-10-CM

## 2020-01-07 DIAGNOSIS — I10 Essential (primary) hypertension: Secondary | ICD-10-CM | POA: Diagnosis not present

## 2020-01-12 ENCOUNTER — Other Ambulatory Visit: Payer: Medicare Other

## 2020-01-12 ENCOUNTER — Encounter (INDEPENDENT_AMBULATORY_CARE_PROVIDER_SITE_OTHER): Payer: Self-pay | Admitting: Nurse Practitioner

## 2020-01-12 ENCOUNTER — Telehealth (INDEPENDENT_AMBULATORY_CARE_PROVIDER_SITE_OTHER): Payer: Self-pay

## 2020-01-12 NOTE — Progress Notes (Addendum)
Subjective:    Patient ID: Kayla [name], female    DOB: 30-Oct-1926, 86 y.o.   MRN: [id number] Chief Complaint  Patient presents with  . Follow-up    Mesenteric    The patient returns to the office for evaluation of known mesenteric ischemia.  The patient was recently seen by Dr. Wyn Quaker, and at that time she had little symptoms.  Since her most recent office visit on 09/19/2019, the patient has noted some weight loss as well as nausea.  The patient does substantiate food fear, particular foods do not seem to aggravate or alleviate the symptoms.  The patient denies bloody bowel movements or diarrhea.   No history of peptic ulcer disease.   The patient denies amaurosis fugax or recent TIA symptoms. There are no recent neurological changes noted. The patient denies claudication symptoms or rest pain symptoms. The patient denies history of DVT, PE or superficial thrombophlebitis. The patient denies recent episodes of angina     At the previous office visit the patient had evidence in mesenteric stenosis at 75 to 99% in the SMA.     Review of Systems  Constitutional: Positive for unexpected weight change.  Gastrointestinal: Positive for abdominal pain and nausea.       Objective:   Physical Exam Vitals reviewed.  HENT:     Head: Normocephalic.  Cardiovascular:     Rate and Rhythm: Normal rate and regular rhythm.     Pulses: Normal pulses.     Heart sounds: Normal heart sounds.  Pulmonary:     Effort: Pulmonary effort is normal.     Breath sounds: Normal breath sounds.  Abdominal:     General: Bowel sounds are normal.  Neurological:     Mental Status: She is alert and oriented to person, place, and time.  Psychiatric:        Mood and Affect: Mood normal.        Behavior: Behavior normal.        Thought Content: Thought content normal.        Judgment: Judgment normal.     BP (!) 163/63   Pulse (!) 54   Ht 5\' 1"  (1.549 m)   Wt 144 lb (65.3 kg)   BMI 27.21 kg/m    Past Medical History:  Diagnosis Date  . Arthritis   . Bundle branch block, left    Chronic  . CAD (coronary artery disease)    a. 2001 s/p CABG @ [facility]; b. 2009 Neg MV.  . Carotid arterial disease (HCC)    a. 10/2019 Carotid U/S: RICA 40-59, LICA 1-39. F/u 2 yrs.  . Cholecystitis    from record  . Diastolic dysfunction    a. 06/2019 Echo: EF 55-60%, Gr2 DD.  07/2019 Emphysema lung (HCC)   . GERD (gastroesophageal reflux disease)   . Heart murmur   . HOH (hard of hearing)   . Hyperlipidemia   . Hypertension   . Hypothyroidism   . Orthostatic headache    Mild  . PAF (paroxysmal atrial fibrillation) (HCC)    a. Dx 10/2019 - converted spontaneously to sinus ED; b. CHA2DS2VASc = 5-->Eliquis.  . Pain    HIP AND LEG PAIN USES  CANE  . 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 03/2019 renal duplex w/ incidental finding of celia and SMA stenosis; b. 09/2019 Mesenteric Duplex: Nl Celiac, Hepatic, and Splenic arteries. 70-99% SMA stenosis-->Seen by vasc surgery-->conservative mgmt given lack of high-grade dzs and symptoms.  10/2019  Valvular heart disease    a. 06/2019 Echo: EF 55-60%, Gr2 DD. No rwma. Nl RV fxn. Mild-mod MR. Mild TR/AI/PR. Mild to mod Ao sclerosis w/o stenosis.  . Vertigo     Social History   Socioeconomic History  . Marital status: Widowed    Spouse name: Not on file  . Number of children: Not on file  . Years of education: Not on file  . Highest education level: Not on file  Occupational History  . Occupation: Part time  Tobacco Use  . Smoking status: Never Smoker  . Smokeless tobacco: Never Used  Vaping Use  . Vaping Use: Never used  Substance and Sexual Activity  . Alcohol use: No  . Drug use: No  . Sexual activity: Never  Other Topics Concern  . Not on file  Social History Narrative   Widow   Lives with one one her daughters      6 children      Lives in [place]            Social Determinants of Health   Financial Resource Strain: Low Risk    . Difficulty of Paying Living Expenses: Not hard at all  Food Insecurity: No Food Insecurity  . Worried About Programme researcher, broadcasting/film/video in the Last Year: Never true  . Ran Out of Food in the Last Year: Never true  Transportation Needs: No Transportation Needs  . Lack of Transportation (Medical): No  . Lack of Transportation (Non-Medical): No  Physical Activity: Unknown  . Days of Exercise per Week: 0 days  . Minutes of Exercise per Session: Not on file  Stress: No Stress Concern Present  . Feeling of Stress : Not at all  Social Connections:   . Frequency of Communication with Friends and Family:   . Frequency of Social Gatherings with Friends and Family:   . Attends Religious Services:   . Active Member of Clubs or Organizations:   . Attends Banker Meetings:   [name] Marital Status:   Intimate Partner Violence:   . Fear of Current or Ex-Partner:   . Emotionally Abused:   [name] Physically Abused:   . Sexually Abused:     Past Surgical History:  Procedure Laterality Date  . APPENDECTOMY  1957  . BREAST BIOPSY Left 2011   neg, stereo done by Dr. Evette Cristal  . BREAST BIOPSY    . CARDIAC CATHETERIZATION  2000   [facility]  . CATARACT EXTRACTION Right    right 2004, left 2018   . CATARACT EXTRACTION W/PHACO Left 04/12/2017   Procedure: CATARACT EXTRACTION PHACO AND INTRAOCULAR LENS PLACEMENT (IOC);  Surgeon: Lockie Mola, MD;  Location: [facility] ORS;  Service: Ophthalmology;  Laterality: Left;  [place] 01:09.0AP% 23.7CDE 16.35FLUID PACK LOT # [id number]  . CHOLECYSTECTOMY N/A 04/25/2016   Procedure: LAPAROSCOPIC CHOLECYSTECTOMY WITH INTRAOPERATIVE CHOLANGIOGRAM;  Surgeon: Kieth Brightly, MD;  Location: [facility] ORS;  Service: General;  Laterality: N/A;  . CORONARY ANGIOPLASTY     STENT  . CORONARY ARTERY BYPASS GRAFT  2001  . KNEE SURGERY    . thyroid  1970  . UPPER GI ENDOSCOPY  2014    Family History  Problem Relation Age of Onset  . Heart failure Mother 87  . Heart failure  Sister 70  . Heart attack Sister   . Heart attack Brother 42    Allergies  Allergen Reactions  . Bee Pollen     Other reaction(s): Other (See Comments)  . Boniva [Ibandronic Acid]  Jaw pain  . Pollen Extract   . Motrin [Ibuprofen] Rash       Assessment & Plan:   1. Mesenteric artery stenosis (HCC) Recommend:  The patient has evidence of severe atherosclerotic changes of the mesenteric arteries associated with weight loss as well as abdominal pain and N/V.  This represents a high risk for bowel infarction and death.  Patient should undergo angiography of the mesenteric arteries with the hope for intervention to eliminate the ischemic symptoms.    The risks and benefits as well as the alternative therapies was discussed in detail with the patient.  All questions were answered.  Patient agrees to proceed with angiography and intervention.  The patient will follow up with me after the angiogram.  2. Hypertension, unspecified type Continue antihypertensive medications as already ordered, these medications have been reviewed and there are no changes at this time.    Current Outpatient Medications on File Prior to Visit  Medication Sig Dispense Refill  . amLODipine (NORVASC) 10 MG tablet TAKE 1 TABLET BY MOUTH  DAILY (Patient taking differently: Take 10 mg by mouth daily. ) 90 tablet 3  . carvedilol (COREG) 12.5 MG tablet TAKE 1 TABLET BY MOUTH  TWICE DAILY 180 tablet 3  . cloNIDine (CATAPRES) 0.1 MG tablet TAKE 1 TABLET BY MOUTH  TWICE DAILY 180 tablet 3  . Cyanocobalamin (VITAMIN B 12 PO) Take 3,000 Units by mouth daily.    . famotidine (PEPCID) 20 MG tablet TAKE 1 TABLET BY MOUTH  TWICE DAILY 180 tablet 3  . hydrALAZINE (APRESOLINE) 25 MG tablet TAKE 1 TABLET BY MOUTH 3  TIMES DAILY AS NEEDED FOR  SYSTOLIC BLOOD PRESSURE  ABOVE 150 270 tablet 1  . levothyroxine (SYNTHROID) 100 MCG tablet TAKE 1 TABLET BY MOUTH  DAILY BEFORE BREAKFAST 90 tablet 3  . losartan (COZAAR) 100 MG  tablet TAKE 1 TABLET BY MOUTH  DAILY 90 tablet 3  . nitroGLYCERIN (NITROSTAT) 0.4 MG SL tablet Place 1 tablet (0.4 mg total) under the tongue every 5 (five) minutes as needed for chest pain. 25 tablet 1  . pravastatin (PRAVACHOL) 20 MG tablet TAKE 1 TABLET BY MOUTH AT  BEDTIME 90 tablet 3  . apixaban (ELIQUIS) 5 MG TABS tablet Take 1 tablet (5 mg total) by mouth 2 (two) times daily. 60 tablet 1   No current facility-administered medications on file prior to visit.    There are no Patient Instructions on file for this visit. No follow-ups on file.   Georgiana Spinner, NP

## 2020-01-12 NOTE — Telephone Encounter (Signed)
Spoke with the patient and she is scheduled with Dr. Wyn Quaker for a mesenteric angio on 01/15/20 with a 9:00 am arrival time to the [facility]. Patient will do covid testing on 01/13/20 between 8-1 pm at the [facility]. Pre-procedure instructions were discussed and will be mailed.

## 2020-01-13 ENCOUNTER — Other Ambulatory Visit: Payer: Self-pay

## 2020-01-13 ENCOUNTER — Other Ambulatory Visit (INDEPENDENT_AMBULATORY_CARE_PROVIDER_SITE_OTHER): Payer: Self-pay | Admitting: Nurse Practitioner

## 2020-01-13 ENCOUNTER — Other Ambulatory Visit
Admission: RE | Admit: 2020-01-13 | Discharge: 2020-01-13 | Disposition: A | Discharge status: Home / Self Care | Payer: Medicare Other | Source: Ambulatory Visit | Attending: Vascular Surgery | Admitting: Vascular Surgery

## 2020-01-13 DIAGNOSIS — Z01812 Encounter for preprocedural laboratory examination: Secondary | ICD-10-CM | POA: Diagnosis not present

## 2020-01-13 DIAGNOSIS — Z20822 Contact with and (suspected) exposure to covid-19: Secondary | ICD-10-CM | POA: Diagnosis not present

## 2020-01-13 LAB — SARS CORONAVIRUS 2 (TAT 6-24 HRS): SARS Coronavirus 2: NEGATIVE

## 2020-01-14 ENCOUNTER — Other Ambulatory Visit: Payer: Medicare Other

## 2020-01-15 ENCOUNTER — Ambulatory Visit
Admission: RE | Admit: 2020-01-15 | Discharge: 2020-01-15 | Disposition: A | Discharge status: Home / Self Care | Payer: Medicare Other | Attending: Vascular Surgery | Admitting: Vascular Surgery

## 2020-01-15 ENCOUNTER — Other Ambulatory Visit: Payer: Self-pay

## 2020-01-15 ENCOUNTER — Encounter: Payer: Self-pay | Admitting: Vascular Surgery

## 2020-01-15 ENCOUNTER — Encounter
Admission: RE | Disposition: A | Discharge status: Home / Self Care | Payer: Self-pay | Source: Home / Self Care | Attending: Vascular Surgery

## 2020-01-15 DIAGNOSIS — K551 Chronic vascular disorders of intestine: Secondary | ICD-10-CM | POA: Insufficient documentation

## 2020-01-15 DIAGNOSIS — H9190 Unspecified hearing loss, unspecified ear: Secondary | ICD-10-CM | POA: Insufficient documentation

## 2020-01-15 DIAGNOSIS — M1990 Unspecified osteoarthritis, unspecified site: Secondary | ICD-10-CM | POA: Insufficient documentation

## 2020-01-15 DIAGNOSIS — I6523 Occlusion and stenosis of bilateral carotid arteries: Secondary | ICD-10-CM | POA: Insufficient documentation

## 2020-01-15 DIAGNOSIS — Z8249 Family history of ischemic heart disease and other diseases of the circulatory system: Secondary | ICD-10-CM | POA: Diagnosis not present

## 2020-01-15 DIAGNOSIS — I2510 Atherosclerotic heart disease of native coronary artery without angina pectoris: Secondary | ICD-10-CM | POA: Diagnosis not present

## 2020-01-15 DIAGNOSIS — J439 Emphysema, unspecified: Secondary | ICD-10-CM | POA: Insufficient documentation

## 2020-01-15 DIAGNOSIS — Z7901 Long term (current) use of anticoagulants: Secondary | ICD-10-CM | POA: Insufficient documentation

## 2020-01-15 DIAGNOSIS — I10 Essential (primary) hypertension: Secondary | ICD-10-CM | POA: Insufficient documentation

## 2020-01-15 DIAGNOSIS — E039 Hypothyroidism, unspecified: Secondary | ICD-10-CM | POA: Diagnosis not present

## 2020-01-15 DIAGNOSIS — I480 Paroxysmal atrial fibrillation: Secondary | ICD-10-CM | POA: Diagnosis not present

## 2020-01-15 DIAGNOSIS — Z79899 Other long term (current) drug therapy: Secondary | ICD-10-CM | POA: Diagnosis not present

## 2020-01-15 DIAGNOSIS — I779 Disorder of arteries and arterioles, unspecified: Secondary | ICD-10-CM | POA: Diagnosis not present

## 2020-01-15 DIAGNOSIS — K219 Gastro-esophageal reflux disease without esophagitis: Secondary | ICD-10-CM | POA: Insufficient documentation

## 2020-01-15 DIAGNOSIS — Z951 Presence of aortocoronary bypass graft: Secondary | ICD-10-CM | POA: Diagnosis not present

## 2020-01-15 DIAGNOSIS — K559 Vascular disorder of intestine, unspecified: Secondary | ICD-10-CM

## 2020-01-15 DIAGNOSIS — Z79890 Hormone replacement therapy (postmenopausal): Secondary | ICD-10-CM | POA: Diagnosis not present

## 2020-01-15 DIAGNOSIS — I774 Celiac artery compression syndrome: Secondary | ICD-10-CM | POA: Diagnosis not present

## 2020-01-15 DIAGNOSIS — K55059 Acute (reversible) ischemia of intestine, part and extent unspecified: Secondary | ICD-10-CM | POA: Diagnosis not present

## 2020-01-15 HISTORY — PX: VISCERAL ANGIOGRAPHY: CATH118276

## 2020-01-15 LAB — CREATININE, SERUM
Creatinine, Ser: 0.97 mg/dL (ref 0.44–1.00)
GFR calc Af Amer: 60 mL/min — ABNORMAL LOW (ref >60)
GFR calc non Af Amer: 51 mL/min — ABNORMAL LOW (ref >60)

## 2020-01-15 LAB — BUN: BUN: 20 mg/dL (ref 8–23)

## 2020-01-15 SURGERY — VISCERAL ANGIOGRAPHY
Anesthesia: Moderate Sedation

## 2020-01-15 MED ORDER — ONDANSETRON HCL 4 MG/2ML IJ SOLN
INTRAMUSCULAR | Status: AC
  Filled 2020-01-15: qty 2

## 2020-01-15 MED ORDER — HYDROMORPHONE HCL 1 MG/ML IJ SOLN: 1.0000 mg | Freq: Once | INTRAMUSCULAR | Status: DC | PRN

## 2020-01-15 MED ORDER — CEFAZOLIN SODIUM-DEXTROSE 2-4 GM/100ML-% IV SOLN
INTRAVENOUS | Status: AC
  Filled 2020-01-15: qty 100

## 2020-01-15 MED ORDER — MIDAZOLAM HCL 2 MG/2ML IJ SOLN
INTRAMUSCULAR | Status: DC | PRN
  Administered 2020-01-15: 1 mg via INTRAVENOUS

## 2020-01-15 MED ORDER — SODIUM CHLORIDE 0.9 % IV SOLN: INTRAVENOUS | Status: DC

## 2020-01-15 MED ORDER — DIPHENHYDRAMINE HCL 50 MG/ML IJ SOLN: 50.0000 mg | Freq: Once | INTRAMUSCULAR | Status: DC | PRN

## 2020-01-15 MED ORDER — IODIXANOL 320 MG/ML IV SOLN
INTRAVENOUS | Status: DC | PRN
  Administered 2020-01-15: 60 mL

## 2020-01-15 MED ORDER — FENTANYL CITRATE (PF) 100 MCG/2ML IJ SOLN
INTRAMUSCULAR | Status: DC | PRN
  Administered 2020-01-15: 25 ug via INTRAVENOUS

## 2020-01-15 MED ORDER — ONDANSETRON HCL 4 MG/2ML IJ SOLN
4.0000 mg | Freq: Four times a day (QID) | INTRAMUSCULAR | Status: DC | PRN
  Administered 2020-01-15: 4 mg via INTRAVENOUS

## 2020-01-15 MED ORDER — FENTANYL CITRATE (PF) 100 MCG/2ML IJ SOLN
INTRAMUSCULAR | Status: AC
  Filled 2020-01-15: qty 2

## 2020-01-15 MED ORDER — HEPARIN SODIUM (PORCINE) 1000 UNIT/ML IJ SOLN
INTRAMUSCULAR | Status: AC
  Filled 2020-01-15: qty 1

## 2020-01-15 MED ORDER — HEPARIN SODIUM (PORCINE) 1000 UNIT/ML IJ SOLN
INTRAMUSCULAR | Status: DC | PRN
  Administered 2020-01-15: 4000 [IU] via INTRAVENOUS

## 2020-01-15 MED ORDER — FAMOTIDINE 20 MG PO TABS: 40.0000 mg | ORAL_TABLET | Freq: Once | ORAL | Status: DC | PRN

## 2020-01-15 MED ORDER — MIDAZOLAM HCL 2 MG/ML PO SYRP: 8.0000 mg | ORAL_SOLUTION | Freq: Once | ORAL | Status: DC | PRN

## 2020-01-15 MED ORDER — METHYLPREDNISOLONE SODIUM SUCC 125 MG IJ SOLR: 125.0000 mg | Freq: Once | INTRAMUSCULAR | Status: DC | PRN

## 2020-01-15 MED ORDER — MIDAZOLAM HCL 5 MG/5ML IJ SOLN
INTRAMUSCULAR | Status: AC
  Filled 2020-01-15: qty 5

## 2020-01-15 MED ORDER — CEFAZOLIN SODIUM-DEXTROSE 2-4 GM/100ML-% IV SOLN: 2.0000 g | Freq: Once | INTRAVENOUS | Status: AC

## 2020-01-15 SURGICAL SUPPLY — 19 items
BALLN ULTRVRSE  6X40X75C (BALLOONS) ×3
BALLN ULTRVRSE 6X40X75C (BALLOONS) ×1
BALLN ULTRVRSE PTA 5X40X75C (BALLOONS) ×3
BALLOON ULTRVRSE 6X40X75C (BALLOONS) IMPLANT
BALLOON ULTRVRSE PTA 5X40X75C (BALLOONS) IMPLANT
CATH ANGIO 5F PIGTAIL 65CM (CATHETERS) ×2 IMPLANT
CATH VS15FR (CATHETERS) ×2 IMPLANT
DEVICE PRESTO INFLATION (MISCELLANEOUS) ×2 IMPLANT
DEVICE STARCLOSE SE CLOSURE (Vascular Products) ×2 IMPLANT
GLIDEWIRE STIFF .35X180X3 HYDR (WIRE) ×2 IMPLANT
PACK ANGIOGRAPHY (CUSTOM PROCEDURE TRAY) ×2 IMPLANT
SHEATH ANL2 6FRX45 HC (SHEATH) ×2 IMPLANT
SHEATH BRITE TIP 5FRX11 (SHEATH) ×2 IMPLANT
SHEATH HIGHFLEX ANSEL 6FRX55 (SHEATH) IMPLANT
STENT LIFESTREAM 7X26X80 (Permanent Stent) ×2 IMPLANT
SYR MEDRAD MARK 7 150ML (SYRINGE) ×2 IMPLANT
TUBING CONTRAST HIGH PRESS 72 (TUBING) ×3 IMPLANT
WIRE J 3MM .035X145CM (WIRE) ×2 IMPLANT
WIRE MAGIC TOR.035 180C (WIRE) ×2 IMPLANT

## 2020-01-15 NOTE — Discharge Instructions (Signed)
Moderate Conscious Sedation, Adult, Care After These instructions provide you with information about caring for yourself after your procedure. Your health care provider may also give you more specific instructions. Your treatment has been planned according to current medical practices, but problems sometimes occur. Call your health care provider if you have any problems or questions after your procedure. What can I expect after the procedure? After your procedure, it is common:  To feel sleepy for several hours.  To feel clumsy and have poor balance for several hours.  To have poor judgment for several hours.  To vomit if you eat too soon. Follow these instructions at home: For at least 24 hours after the procedure:   Do not: ? Participate in activities where you could fall or become injured. ? Drive. ? Use heavy machinery. ? Drink alcohol. ? Take sleeping pills or medicines that cause drowsiness. ? Make important decisions or sign legal documents. ? Take care of children on your own.  Rest. Eating and drinking  Follow the diet recommended by your health care provider.  If you vomit: ? Drink water, juice, or soup when you can drink without vomiting. ? Make sure you have little or no nausea before eating solid foods. General instructions  Have a responsible adult stay with you until you are awake and alert.  Take over-the-counter and prescription medicines only as told by your health care provider.  If you smoke, do not smoke without supervision.  Keep all follow-up visits as told by your health care provider. This is important. Contact a health care provider if:  You keep feeling nauseous or you keep vomiting.  You feel light-headed.  You develop a rash.  You have a fever. Get help right away if:  You have trouble breathing. This information is not intended to replace advice given to you by your health care provider. Make sure you discuss any questions you have  with your health care provider. Document Revised: 06/15/2017 Document Reviewed: 10/23/2015 Elsevier Patient Education  2020 Elsevier Inc. Angiogram, Care After This sheet gives you information about how to care for yourself after your procedure. Your doctor may also give you more specific instructions. If you have problems or questions, contact your doctor. Follow these instructions at home: Insertion site care  Follow instructions from your doctor about how to take care of your long, thin tube (catheter) insertion area. Make sure you: ? Wash your hands with soap and water before you change your bandage (dressing). If you cannot use soap and water, use hand sanitizer. ? Change your bandage as told by your doctor. ? Leave stitches (sutures), skin glue, or skin tape (adhesive) strips in place. They may need to stay in place for 2 weeks or longer. If tape strips get loose and curl up, you may trim the loose edges. Do not remove tape strips completely unless your doctor says it is okay.  Do not take baths, swim, or use a hot tub until your doctor says it is okay.  You may shower 24-48 hours after the procedure or as told by your doctor. ? Gently wash the area with plain soap and water. ? Pat the area dry with a clean towel. ? Do not rub the area. This may cause bleeding.  Do not apply powder or lotion to the area. Keep the area clean and dry.  Check your insertion area every day for signs of infection. Check for: ? More redness, swelling, or pain. ? Fluid or blood. ?   Warmth. ? Pus or a bad smell. Activity  Rest as told by your doctor, usually for 1-2 days.  Do not lift anything that is heavier than 10 lbs. (4.5 kg) or as told by your doctor.  Do not drive for 24 hours if you were given a medicine to help you relax (sedative).  Do not drive or use heavy machinery while taking prescription pain medicine. General instructions   Go back to your normal activities as told by your  doctor, usually in about a week. Ask your doctor what activities are safe for you.  If the insertion area starts to bleed, lie flat and put pressure on the area. If the bleeding does not stop, get help right away. This is an emergency.  Drink enough fluid to keep your pee (urine) clear or pale yellow.  Take over-the-counter and prescription medicines only as told by your doctor.  Keep all follow-up visits as told by your doctor. This is important. Contact a doctor if:  You have a fever.  You have chills.  You have more redness, swelling, or pain around your insertion area.  You have fluid or blood coming from your insertion area.  The insertion area feels warm to the touch.  You have pus or a bad smell coming from your insertion area.  You have more bruising around the insertion area.  Blood collects in the tissue around the insertion area (hematoma) that may be painful to the touch. Get help right away if:  You have a lot of pain in the insertion area.  The insertion area swells very fast.  The insertion area is bleeding, and the bleeding does not stop after holding steady pressure on the area.  The area near or just beyond the insertion area becomes pale, cool, tingly, or numb. These symptoms may be an emergency. Do not wait to see if the symptoms will go away. Get medical help right away. Call your local emergency services (911 in the [place] Do not drive yourself to the hospital. Summary  After the procedure, it is common to have bruising and tenderness at the long, thin tube insertion area.  After the procedure, it is important to rest and drink plenty of fluids.  Do not take baths, swim, or use a hot tub until your doctor says it is okay to do so. You may shower 24-48 hours after the procedure or as told by your doctor.  If the insertion area starts to bleed, lie flat and put pressure on the area. If the bleeding does not stop, get help right away. This is an  emergency. This information is not intended to replace advice given to you by your health care provider. Make sure you discuss any questions you have with your health care provider. Document Revised: 06/15/2017 Document Reviewed: 06/27/2016 Elsevier Patient Education  2020 Elsevier Inc.  

## 2020-01-15 NOTE — Op Note (Signed)
[facility]  Percutaneous Study/Intervention Procedural Note   Date: 01/15/2020  Surgeon(s): Leotis Pain, MD  Assistants: none  Pre-operative Diagnosis: 1. Chronic mesenteric ischemia 2. Celiac and SMA stenosis   Post-operative diagnosis:  Same  Procedure(s) Performed:             1.  Ultrasound guidance for vascular access right femoral artery              2.  Catheter placement into celiac artery and superior mesenteric artery from right femoral approach             3.  Aortogram and selective angiogram of the celiac and superior mesenteric artery             4.  Stent to the SMA with 7 mm diameter x 26 mm length balloon expandable stent             5.  StarClose closure device right femoral artery  Contrast: 60 cc  Fluoro time: 7.7 minutes  EBL: 10 cc  Anesthesia: Approximately 35 minutes of Moderate conscious sedation using 1 mg of Versed and 25 mcg of Fentanyl              Indications:  Patient is a 86 y.o. female who has symptoms consistent with mesenteric ischemia. The patient has abdominal pain and food fear and a noninvasive study showing high-grade SMA disease as well as some celiac artery disease. The patient is brought in for angiography for further evaluation and potential treatment. Risks and benefits are discussed and informed consent is obtained  Procedure:  The patient was identified and appropriate procedural time out was performed.  The patient was then placed supine on the table and prepped and draped in the usual sterile fashion. Moderate conscious sedation was administered during a face to face encounter with the patient throughout the procedure with my supervision of the RN administering medicines and monitoring the patient's vital signs, pulse oximetry, telemetry and mental status throughout from the start of the procedure until the patient was taken to the recovery room. Ultrasound was used to evaluate the right common femoral artery.  It  was patent .  A digital ultrasound image was acquired.  A Seldinger needle was used to access the right common femoral artery under direct ultrasound guidance and a permanent image was performed.  A 0.035 J wire was advanced without resistance and a 5Fr sheath was placed.  Pigtail catheter was placed into the aorta and an AP aortogram was performed. This demonstrated highly calcific aorta with what appeared to be at least moderate disease of the renal arteries.  The origins of the SMA and celiac artery were not well seen.  The aorta was patent as were the iliac arteries.. We transitioned to the lateral projection to image the celiac and SMA. The lateral image demonstrated what appeared to be severe disease of both vessels which was highly calcific.  The patient was given 4000 units of IV heparin. We upsized to a 6 Fr sheath.  A V S1 catheter was used to selectively cannulate the celiac artery.  This demonstrated an 80 to 90% highly calcific stenosis with a short main trunk before the trifurcation of the celiac artery.  I then turned my attention to the SMA.  The S1 catheter was used to selectively cannulate the SMA and selective imaging was performed.  This demonstrated a good highly calcific 75-80 stenosis of the SMA.. Based on her symptoms and these findings,  I elected to treat the SMA to try to improve the patient's clinical course. I crossed the lesion without difficulty with a Glidewire and then exchanged for a Magic torque wire and an Ansell sheath was placed to the origin.  Initially, I predilated the lesion with a 5 mm balloon and then was able to advance the sheath across of this. I then used a 7 mm diameter x 26 mm length balloon expandable stent to perform treatment of the SMA. I inflated the balloon to 10 atm. On completion angiogram following this, 5-10% residual stenosis was identified. At this point, I elected to terminate the procedure. The diagnostic catheter was removed. StarClose closure device  was deployed in usual fashion with excellent hemostatic result. The patient was taken to the recovery room in stable condition having tolerated the procedure well.     Findings: Celiac artery demonstrated an 80 to 90% stenosis which was highly calcific.  SMA demonstrated a 75 to 80% stenosis which was highly calcific.  Disposition: Patient was taken to the recovery room in stable condition having tolerated the procedure well.  Complications:  None  Leotis Pain 01/15/2020 12:06 PM   This note was created with Dragon Medical transcription system. Any errors in dictation are purely unintentional.

## 2020-01-15 NOTE — H&P (Signed)
[facility] History & Physical Update  The patient was interviewed and re-examined.  The patient's previous History and Physical has been reviewed and is unchanged.  There is no change in the plan of care. We plan to proceed with the scheduled procedure.  Festus Barren, MD  01/15/2020, 9:04 AM

## 2020-01-15 NOTE — OR Nursing (Signed)
Reports feeling nauseated, 4 mg zofran given IV

## 2020-01-29 ENCOUNTER — Other Ambulatory Visit: Payer: Self-pay | Admitting: Cardiovascular Disease

## 2020-01-29 ENCOUNTER — Other Ambulatory Visit: Payer: Self-pay

## 2020-01-29 ENCOUNTER — Ambulatory Visit: Payer: Medicare Other | Admitting: Nurse Practitioner

## 2020-01-29 ENCOUNTER — Encounter: Payer: Self-pay | Admitting: Nurse Practitioner

## 2020-01-29 VITALS — BP 126/54 | HR 53 | Ht 61.0 in | Wt 148.4 lb

## 2020-01-29 DIAGNOSIS — E785 Hyperlipidemia, unspecified: Secondary | ICD-10-CM | POA: Diagnosis not present

## 2020-01-29 DIAGNOSIS — I2510 Atherosclerotic heart disease of native coronary artery without angina pectoris: Secondary | ICD-10-CM | POA: Diagnosis not present

## 2020-01-29 DIAGNOSIS — I10 Essential (primary) hypertension: Secondary | ICD-10-CM

## 2020-01-29 DIAGNOSIS — I480 Paroxysmal atrial fibrillation: Secondary | ICD-10-CM

## 2020-01-29 MED ORDER — APIXABAN 5 MG PO TABS: 5.0000 mg | ORAL_TABLET | Freq: Two times a day (BID) | ORAL | 3 refills | Status: DC

## 2020-01-29 MED ORDER — APIXABAN 5 MG PO TABS: 5.0000 mg | ORAL_TABLET | Freq: Two times a day (BID) | ORAL | 1 refills | Status: DC

## 2020-01-29 NOTE — Patient Instructions (Signed)
Medication Instructions: Your physician recommends that you continue on your current medications as directed. Please refer to the Current Medication list given to you today.  *If you need a refill on your cardiac medications before your next appointment, please call your pharmacy*   Lab Work:none ordered If you have labs (blood work) drawn today and your tests are completely normal, you will receive your results only by: [name] MyChart Message (if you have MyChart) OR . A paper copy in the mail If you have any lab test that is abnormal or we need to change your treatment, we will call you to review the results.   Testing/Procedures:none ordered   Follow-Up: At [facility], you and your health needs are our priority.  As part of our continuing mission to provide you with exceptional heart care, we have created designated Provider Care Teams.  These Care Teams include your primary Cardiologist (physician) and Advanced Practice Providers (APPs -  Physician Assistants and Nurse Practitioners) who all work together to provide you with the care you need, when you need it.  We recommend signing up for the patient portal called "MyChart".  Sign up information is provided on this After Visit Summary.  MyChart is used to connect with patients for Virtual Visits (Telemedicine).  Patients are able to view lab/test results, encounter notes, upcoming appointments, etc.  Non-urgent messages can be sent to your provider as well.   To learn more about what you can do with MyChart, go to [email or web address].    Your next appointment:   As scheduled in Nov

## 2020-01-29 NOTE — Telephone Encounter (Signed)
*  STAT* If patient is at the pharmacy, call can be transferred to refill team.   1. Which medications need to be refilled? (please list name of each medication and dose if known)  Eliquis 5 mg po BID   2. Which pharmacy/location (including street and city if local pharmacy) is medication to be sent to?   [facility] [address] [place]   3. Do they need a 30 day or 90 day supply? 90

## 2020-01-29 NOTE — Telephone Encounter (Signed)
Pt last saw Ward Givens, Kayla [name] on 01/29/20, last labs 01/15/20 Creat 0.97, age 86, weight 67.3kg, based on specified criteria pt is on appropriate dosage of Eliquis 5mg  BID.  Will refill rx.

## 2020-01-29 NOTE — Telephone Encounter (Signed)
Refill request

## 2020-01-29 NOTE — Progress Notes (Signed)
Office Visit    Patient Name: Kayla [name] Date of Encounter: 01/29/2020  Primary Care Provider:  Allegra Grana, FNP Primary Cardiologist:  Julien Nordmann, MD  Chief Complaint    86 y/o ? w/ a h/o CAD status post CABG in 2001, left bundle branch block, hypertension, hyperlipidemia, diastolic dysfunction, mild to moderate valvular heart disease, carotid arterial disease, superior mesenteric artery stenosis/mesenteric ischemia, and paroxysmal atrial fibrillation, who presents for follow-up related to chest pain.  Past Medical History    Past Medical History:  Diagnosis Date  . Arthritis   . Bundle branch block, left    Chronic  . CAD (coronary artery disease)    a. 2001 s/p CABG @ [facility]; b. 2009 Neg MV; c. 12/2019 MV: EF 52%, no ischemia.  . Carotid arterial disease (HCC)    a. 10/2019 Carotid U/S: RICA 40-59, LICA 1-39. F/u 2 yrs.  . Cholecystitis    from record  . Diastolic dysfunction    a. 06/2019 Echo: EF 55-60%, Gr2 DD.  [name] Emphysema lung (HCC)   . GERD (gastroesophageal reflux disease)   . Heart murmur   . HOH (hard of hearing)   . Hyperlipidemia   . Hypertension   . Hypothyroidism   . Orthostatic headache    Mild  . PAF (paroxysmal atrial fibrillation) (HCC)    a. Dx 10/2019 - converted spontaneously to sinus ED; b. CHA2DS2VASc = 5-->Eliquis.  . Pain    HIP AND LEG PAIN USES  CANE  . 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 03/2019 renal duplex w/ incidental finding of celia and SMA stenosis; b. 09/2019 Mesenteric Duplex: Nl Celiac, Hepatic, and Splenic arteries. 70-99% SMA stenosis-->Seen by vasc surgery; c. 01/2020 SMA 75-80%(7x26 balloon expandable stent). Celiac 80-90%.  . Valvular heart disease    a. 06/2019 Echo: EF 55-60%, Gr2 DD. No rwma. Nl RV fxn. Mild-mod MR. Mild TR/AI/PR. Mild to mod Ao sclerosis w/o stenosis.  . Vertigo    Past Surgical History:  Procedure Laterality Date  . APPENDECTOMY  1957  . BREAST BIOPSY Left 2011   neg, stereo  done by Dr. Evette Cristal  . BREAST BIOPSY    . CARDIAC CATHETERIZATION  2000   [facility]  . CATARACT EXTRACTION Right    right 2004, left 2018   . CATARACT EXTRACTION W/PHACO Left 04/12/2017   Procedure: CATARACT EXTRACTION PHACO AND INTRAOCULAR LENS PLACEMENT (IOC);  Surgeon: Lockie Mola, MD;  Location: [facility] ORS;  Service: Ophthalmology;  Laterality: Left;  [place] 01:09.0AP% 23.7CDE 16.35FLUID PACK LOT # [id number]  . CHOLECYSTECTOMY N/A 04/25/2016   Procedure: LAPAROSCOPIC CHOLECYSTECTOMY WITH INTRAOPERATIVE CHOLANGIOGRAM;  Surgeon: Kieth Brightly, MD;  Location: [facility] ORS;  Service: General;  Laterality: N/A;  . CORONARY ANGIOPLASTY     STENT  . CORONARY ARTERY BYPASS GRAFT  2001  . KNEE SURGERY    . thyroid  1970  . UPPER GI ENDOSCOPY  2014  . VISCERAL ANGIOGRAPHY N/A 01/15/2020   Procedure: VISCERAL ANGIOGRAPHY;  Surgeon: Annice Needy, MD;  Location: [facility] INVASIVE CV LAB;  Service: Cardiovascular;  Laterality: N/A;    Allergies  Allergies  Allergen Reactions  . Bee Pollen     Other reaction(s): Other (See Comments)  . Boniva [Ibandronic Acid]     Jaw pain  . Pollen Extract   . Motrin [Ibuprofen] Rash    History of Present Illness    86 year old female with the above complex past medical history including CAD status post CABG  in 2001 at [facility], left bundle branch block, hypertension, hyperlipidemia, diastolic dysfunction, mild to moderate valvular heart disease, carotid arterial disease, superior mesenteric artery stenosis, and paroxysmal atrial fibrillation.  Her last stress test was in 2019 and was nonischemic.  Most recent echo was performed in December 2020 showing normal LV function, grade 2 diastolic dysfunction, and mild to moderate valvular heart disease.  She was diagnosed with paroxysmal atrial fibrillation in April of this year and was placed on Eliquis.  She was last seen in June, at which time she reported a syncopal episode that occurred while she was at her grandson's  graduation party.  She was evaluated in the ER that day and was subsequently discharged home.  She was not interested in event monitoring at follow-up.  She also reported a 5-hour episode of chest pain that occurred that morning.  EKG was nonischemic.  This was followed by stress testing which was nonischemic and showed normal LV function.  She subsequently underwent SMA stenting earlier this month.  Since then, she has been able to eat w/o abd pain/nausea/vomiting.  She has not had any recurrent chest pain.  She does have some dyspnea on exertion while walking up inclines around her house but not with usual activity.  She denies palpitations, PND, orthopnea, dizziness, syncope, edema, or early satiety.  She is somewhat bothered by an increase in bruising since starting Eliquis.  Home Medications    Prior to Admission medications   Medication Sig Start Date End Date Taking? Authorizing Provider  amLODipine (NORVASC) 10 MG tablet TAKE 1 TABLET BY MOUTH  DAILY Patient taking differently: Take 10 mg by mouth daily.  04/02/19  Yes Arnett, Lyn Records, FNP  apixaban (ELIQUIS) 5 MG TABS tablet Take 1 tablet (5 mg total) by mouth 2 (two) times daily. 11/26/19 01/29/20 Yes Arnett, Lyn Records, FNP  carvedilol (COREG) 12.5 MG tablet TAKE 1 TABLET BY MOUTH  TWICE DAILY 09/01/19  Yes Arnett, Lyn Records, FNP  cloNIDine (CATAPRES) 0.1 MG tablet TAKE 1 TABLET BY MOUTH  TWICE DAILY 09/01/19  Yes Gollan, Tollie Pizza, MD  Cyanocobalamin (VITAMIN B 12 PO) Take 3,000 Units by mouth daily.   Yes [provider]  famotidine (PEPCID) 20 MG tablet TAKE 1 TABLET BY MOUTH  TWICE DAILY 04/02/19  Yes Arnett, Lyn Records, FNP  hydrALAZINE (APRESOLINE) 25 MG tablet TAKE 1 TABLET BY MOUTH 3  TIMES DAILY AS NEEDED FOR  SYSTOLIC BLOOD PRESSURE  ABOVE 150 12/04/19  Yes Gollan, Tollie Pizza, MD  levothyroxine (SYNTHROID) 100 MCG tablet TAKE 1 TABLET BY MOUTH  DAILY BEFORE BREAKFAST 04/02/19  Yes Allegra Grana, FNP  losartan (COZAAR) 100  MG tablet TAKE 1 TABLET BY MOUTH  DAILY 02/27/19  Yes Allegra Grana, FNP  nitroGLYCERIN (NITROSTAT) 0.4 MG SL tablet Place 1 tablet (0.4 mg total) under the tongue every 5 (five) minutes as needed for chest pain. 03/21/19  Yes Allegra Grana, FNP  pravastatin (PRAVACHOL) 20 MG tablet TAKE 1 TABLET BY MOUTH AT  BEDTIME 05/20/19  Yes Allegra Grana, FNP    Review of Systems    Doing well since her last visit.  She denies chest pain, palpitations, PND, orthopnea, dizziness, syncope, edema, or early satiety.  She does have chronic, stable dyspnea on exertion.  All other systems reviewed and are otherwise negative except as noted above.  Physical Exam    VS:  BP (!) 126/54 (BP Location: Left Arm, Patient Position: Sitting, Cuff Size: Normal)  Pulse (!) 53   Ht 5\' 1"  (1.549 m)   Wt 148 lb 6 oz (67.3 kg)   SpO2 92%   BMI 28.04 kg/m  , BMI Body mass index is 28.04 kg/m. GEN: Well nourished, well developed, in no acute distress. HEENT: normal. Neck: Supple, no JVD, carotid bruits, or masses. Cardiac: RRR, 2/6 systolic murmur throughout, no rubs, or gallops. No clubbing, cyanosis, edema.  Radials 2+/PT 1+ and equal bilaterally.  Respiratory:  Respirations regular and unlabored, clear to auscultation bilaterally. GI: Soft, nontender, nondistended, BS + x 4. MS: no deformity or atrophy. Skin: warm and dry, no rash. Neuro:  Strength and sensation are intact. Psych: Normal affect.  Accessory Clinical Findings    ECG personally reviewed by me today -sinus bradycardia, 53, left axis deviation, left bundle branch block- no acute changes.  Lab Results  Component Value Date   WBC 7.5 12/20/2019   HGB 12.8 12/20/2019   HCT 38.8 12/20/2019   MCV 94.6 12/20/2019   PLT 207 12/20/2019   Lab Results  Component Value Date   CREATININE 0.97 01/15/2020   BUN 20 01/15/2020   NA 131 (L) 12/20/2019   K 4.1 12/20/2019   CL 98 12/20/2019   CO2 23 12/20/2019   Lab Results  Component Value  Date   ALT 12 10/21/2019   ALT 11 10/21/2019   AST 15 10/21/2019   AST 15 10/21/2019   ALKPHOS 42 10/21/2019   ALKPHOS 42 10/21/2019   BILITOT 1.3 (H) 10/21/2019   BILITOT 1.1 10/21/2019   Lab Results  Component Value Date   CHOL 114 07/29/2018   HDL 54.60 07/29/2018   LDLCALC 45 07/29/2018   TRIG 68.0 07/29/2018   CHOLHDL 2 07/29/2018    Lab Results  Component Value Date   HGBA1C 6.1 (H) 12/24/2019    Assessment & Plan    1.  Midsternal chest pain/CAD: Status post CABG 2001 with negative Myoview in 2009.  At her last visit, she reported a 5-hour episode of chest pain and subsequently underwent stress testing which was nonischemic.  She has had no recurrence of chest pain.  She remains on beta-blocker, ARB, and statin therapy.  No aspirin in the setting of Eliquis.  2.  Syncope: This occurred on June 5 in the setting of sitting outside for much of the day not adequately hydrating.  Creatinine was mildly elevated when she was evaluated in the ED.  She has had no recurrence of symptoms and was not previously interested in event monitoring.  3.  Paroxysmal atrial fibrillation: Maintaining sinus rhythm.  She has had some bruising with Eliquis but does wish to continue therapy.  She otherwise remains on beta-blocker.  CHA2DS2-VASc equals 5.  4.  Hyperlipidemia: LDL of 45 in January 2020 with normal LFTs in April.  She remains on statin therapy.  5.  Mesenteric ischemia: Status post recent SMA stenting.  She notes significant improvement in appetite and is eating much better without symptoms.  6.  Disposition: Follow-up with Dr. May in November as planned.   December, NP 01/29/2020, 11:53 AM

## 2020-02-04 ENCOUNTER — Other Ambulatory Visit: Payer: Self-pay | Admitting: Family

## 2020-02-04 DIAGNOSIS — I10 Essential (primary) hypertension: Secondary | ICD-10-CM

## 2020-02-04 DIAGNOSIS — Z1231 Encounter for screening mammogram for malignant neoplasm of breast: Secondary | ICD-10-CM

## 2020-02-12 ENCOUNTER — Telehealth: Payer: Self-pay | Admitting: Cardiovascular Disease

## 2020-02-12 NOTE — Telephone Encounter (Signed)
Spoke with patient and reviewed that Eliquis is to prevent her from having a stroke due to afib. She verbalized understanding and was agreeable with continuing. She verbalized understanding of our conversation with no further questions at this time.

## 2020-02-12 NOTE — Telephone Encounter (Signed)
Patient would like to know if she can stop taking her Eliquis

## 2020-02-17 ENCOUNTER — Encounter (INDEPENDENT_AMBULATORY_CARE_PROVIDER_SITE_OTHER): Payer: Self-pay | Admitting: Nurse Practitioner

## 2020-02-17 ENCOUNTER — Other Ambulatory Visit: Payer: Self-pay

## 2020-02-17 ENCOUNTER — Ambulatory Visit (INDEPENDENT_AMBULATORY_CARE_PROVIDER_SITE_OTHER): Payer: Medicare Other | Admitting: Nurse Practitioner

## 2020-02-17 ENCOUNTER — Ambulatory Visit (INDEPENDENT_AMBULATORY_CARE_PROVIDER_SITE_OTHER): Payer: Medicare Other

## 2020-02-17 ENCOUNTER — Other Ambulatory Visit (INDEPENDENT_AMBULATORY_CARE_PROVIDER_SITE_OTHER): Payer: Self-pay | Admitting: Vascular Surgery

## 2020-02-17 VITALS — BP 179/61 | HR 65 | Resp 16 | Wt 147.4 lb

## 2020-02-17 DIAGNOSIS — K551 Chronic vascular disorders of intestine: Secondary | ICD-10-CM

## 2020-02-17 DIAGNOSIS — Z95820 Peripheral vascular angioplasty status with implants and grafts: Secondary | ICD-10-CM

## 2020-02-17 DIAGNOSIS — R200 Anesthesia of skin: Secondary | ICD-10-CM

## 2020-02-17 DIAGNOSIS — I10 Essential (primary) hypertension: Secondary | ICD-10-CM

## 2020-02-17 NOTE — Progress Notes (Signed)
Subjective:    Patient ID: Kayla [name], female    DOB: 07/07/1927, 86 y.o.   MRN: [id number] Chief Complaint  Patient presents with  . 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  The patient denies abdominal pain or postprandial symptoms.  The patient denies weight loss as well as nausea.  The patient does not substantiate food fear, particular foods do not seem to aggravate or alleviate the symptoms.  The patient denies bloody bowel movements or diarrhea.  The patient underwent intervention on 01/15/2020 with stent placement to the SMA.  Since that time the patient has not had any difficulties.   The patient denies amaurosis fugax or recent TIA symptoms. There are no recent neurological changes noted. The patient denies claudication symptoms or rest pain symptoms. The patient denies history of DVT, PE or superficial thrombophlebitis. The patient denies recent episodes of angina   The patient is one-point however if she notes that her legs go to sleep at times.  She notes that this is sporadic.  She notes that it is not always associated with movement or certain positions.  The patient denies any issues with back pain or spinal stenosis.  Today noninvasive studies show a mild decrease in the SMA velocity D when compared to the previous exam.  The SMA stent that was placed is open and patent.  Previous angiogram on 01/15/2020 estimates an 80 to 99% celiac artery stenosis.   Review of Systems  Cardiovascular: Positive for leg swelling.  Neurological: Positive for numbness.       Objective:   Physical Exam Vitals reviewed.  HENT:     Head: Normocephalic.  Cardiovascular:     Rate and Rhythm: Normal rate and regular rhythm.     Pulses:          Radial pulses are 2+ on the right side and 2+ on the left side.       Dorsalis pedis pulses  are 1+ on the right side and 1+ on the left side.       Posterior tibial pulses are 1+ on the right side and 1+ on the left side.     Heart sounds: Murmur heard.   Pulmonary:     Effort: Pulmonary effort is normal.     Breath sounds: Normal breath sounds.  Abdominal:     General: Abdomen is flat.  Musculoskeletal:     Right lower leg: Edema present.     Left lower leg: Edema present.  Skin:    General: Skin is warm and dry.  Neurological:     Mental Status: She is alert and oriented to person, place, and time.     Motor: Weakness present.     Gait: Gait abnormal.  Psychiatric:        Mood and Affect: Mood normal.        Behavior: Behavior normal.        Thought Content: Thought content normal.        Judgment: Judgment normal.     BP (!) 179/61 (BP Location: Right Arm)   Pulse 65   Resp 16   Wt 147 lb 6.4 oz (66.9 kg)   BMI 27.85 kg/m   Past Medical History:  Diagnosis Date  . Arthritis   . Bundle branch block, left    Chronic  . CAD (  coronary artery disease)    a. 2001 s/p CABG @ [facility]; b. 2009 Neg MV; c. 12/2019 MV: EF 52%, no ischemia.  . Carotid arterial disease (HCC)    a. 10/2019 Carotid U/S: RICA 40-59, LICA 1-39. F/u 2 yrs.  . Cholecystitis    from record  . Diastolic dysfunction    a. 06/2019 Echo: EF 55-60%, Gr2 DD.  [name] Emphysema lung (HCC)   . GERD (gastroesophageal reflux disease)   . Heart murmur   . HOH (hard of hearing)   . Hyperlipidemia   . Hypertension   . Hypothyroidism   . Orthostatic headache    Mild  . PAF (paroxysmal atrial fibrillation) (HCC)    a. Dx 10/2019 - converted spontaneously to sinus ED; b. CHA2DS2VASc = 5-->Eliquis.  . Pain    HIP AND LEG PAIN USES  CANE  . 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 03/2019 renal duplex w/ incidental finding of celia and SMA stenosis; b. 09/2019 Mesenteric Duplex: Nl Celiac, Hepatic, and Splenic arteries. 70-99% SMA stenosis-->Seen by vasc surgery; c. 01/2020 SMA 75-80%(7x26 balloon  expandable stent). Celiac 80-90%.  . Valvular heart disease    a. 06/2019 Echo: EF 55-60%, Gr2 DD. No rwma. Nl RV fxn. Mild-mod MR. Mild TR/AI/PR. Mild to mod Ao sclerosis w/o stenosis.  . Vertigo     Social History   Socioeconomic History  . Marital status: Widowed    Spouse name: Not on file  . Number of children: Not on file  . Years of education: Not on file  . Highest education level: Not on file  Occupational History  . Occupation: Part time  Tobacco Use  . Smoking status: Never Smoker  . Smokeless tobacco: Never Used  Vaping Use  . Vaping Use: Never used  Substance and Sexual Activity  . Alcohol use: No  . Drug use: No  . Sexual activity: Never  Other Topics Concern  . Not on file  Social History Narrative   Widow   Lives with one one her daughters      6 children      Lives in [place]            Social Determinants of Health   Financial Resource Strain: Low Risk   . Difficulty of Paying Living Expenses: Not hard at all  Food Insecurity: No Food Insecurity  . Worried About Programme researcher, broadcasting/film/video in the Last Year: Never true  . Ran Out of Food in the Last Year: Never true  Transportation Needs: No Transportation Needs  . Lack of Transportation (Medical): No  . Lack of Transportation (Non-Medical): No  Physical Activity: Unknown  . Days of Exercise per Week: 0 days  . Minutes of Exercise per Session: Not on file  Stress: No Stress Concern Present  . Feeling of Stress : Not at all  Social Connections:   . Frequency of Communication with Friends and Family:   . Frequency of Social Gatherings with Friends and Family:   . Attends Religious Services:   . Active Member of Clubs or Organizations:   . Attends Banker Meetings:   [name] Marital Status:   Intimate Partner Violence:   . Fear of Current or Ex-Partner:   . Emotionally Abused:   [name] Physically Abused:   . Sexually Abused:     Past Surgical History:  Procedure Laterality Date  .  APPENDECTOMY  1957  . BREAST BIOPSY Left 2011   neg, stereo done by Dr.  Sankar  . BREAST BIOPSY    . CARDIAC CATHETERIZATION  2000   [facility]  . CATARACT EXTRACTION Right    right 2004, left 2018   . CATARACT EXTRACTION W/PHACO Left 04/12/2017   Procedure: CATARACT EXTRACTION PHACO AND INTRAOCULAR LENS PLACEMENT (IOC);  Surgeon: Lockie Mola, MD;  Location: [facility] ORS;  Service: Ophthalmology;  Laterality: Left;  [place] 01:09.0AP% 23.7CDE 16.35FLUID PACK LOT # [id number]  . CHOLECYSTECTOMY N/A 04/25/2016   Procedure: LAPAROSCOPIC CHOLECYSTECTOMY WITH INTRAOPERATIVE CHOLANGIOGRAM;  Surgeon: Kieth Brightly, MD;  Location: [facility] ORS;  Service: General;  Laterality: N/A;  . CORONARY ANGIOPLASTY     STENT  . CORONARY ARTERY BYPASS GRAFT  2001  . KNEE SURGERY    . thyroid  1970  . UPPER GI ENDOSCOPY  2014  . VISCERAL ANGIOGRAPHY N/A 01/15/2020   Procedure: VISCERAL ANGIOGRAPHY;  Surgeon: Annice Needy, MD;  Location: [facility] INVASIVE CV LAB;  Service: Cardiovascular;  Laterality: N/A;    Family History  Problem Relation Age of Onset  . Heart failure Mother 47  . Heart failure Sister 42  . Heart attack Sister   . Heart attack Brother 42    Allergies  Allergen Reactions  . Bee Pollen     Other reaction(s): Other (See Comments)  . Boniva [Ibandronic Acid]     Jaw pain  . Pollen Extract   . Motrin [Ibuprofen] Rash       Assessment & Plan:   1. Mesenteric artery stenosis (HCC) Recommend:  The patient is status post successful angiogram with intervention of the mesenteric vessels.  Angioplasty with stent placement was performed.  The patient reports that the abdominal pain is improved and the post prandial symptoms are essentially gone.   The patient denies lifestyle limiting changes at this point in time.  No further invasive studies, angiography or surgery at this time The patient should continue walking and begin a more formal exercise program.  The patient should continue  antiplatelet therapy and aggressive treatment of the lipid abnormalities  Patient should undergo noninvasive studies as ordered. The patient will follow up with me after the studies.    2. Hypertension, unspecified type Continue antihypertensive medications as already ordered, these medications have been reviewed and there are no changes at this time.   3. Bilateral leg numbness On the patient returns for evaluation of her mesenteric artery stenosis, we will evaluate her bilateral lower extremities arterial circulation.  The patient does not have evidence of any ischemic or limb threatening symptoms.  But the patient does endorse having some sporadic feelings of her legs "going to sleep", we will have the patient get ABIs upon returning to ensure that there is no vascular component to this.   Current Outpatient Medications on File Prior to Visit  Medication Sig Dispense Refill  . amLODipine (NORVASC) 10 MG tablet TAKE 1 TABLET BY MOUTH  DAILY (Patient taking differently: Take 10 mg by mouth daily. ) 90 tablet 3  . apixaban (ELIQUIS) 5 MG TABS tablet Take 1 tablet (5 mg total) by mouth 2 (two) times daily. 180 tablet 1  . carvedilol (COREG) 12.5 MG tablet TAKE 1 TABLET BY MOUTH  TWICE DAILY 180 tablet 3  . cloNIDine (CATAPRES) 0.1 MG tablet TAKE 1 TABLET BY MOUTH  TWICE DAILY 180 tablet 3  . Cyanocobalamin (VITAMIN B 12 PO) Take 3,000 Units by mouth daily.    . famotidine (PEPCID) 20 MG tablet TAKE 1 TABLET BY MOUTH  TWICE DAILY 180 tablet 3  .  hydrALAZINE (APRESOLINE) 25 MG tablet TAKE 1 TABLET BY MOUTH 3  TIMES DAILY AS NEEDED FOR  SYSTOLIC BLOOD PRESSURE  ABOVE 150 270 tablet 1  . levothyroxine (SYNTHROID) 100 MCG tablet TAKE 1 TABLET BY MOUTH  DAILY BEFORE BREAKFAST 90 tablet 3  . losartan (COZAAR) 100 MG tablet TAKE 1 TABLET BY MOUTH  DAILY 90 tablet 3  . nitroGLYCERIN (NITROSTAT) 0.4 MG SL tablet Place 1 tablet (0.4 mg total) under the tongue every 5 (five) minutes as needed for chest  pain. 25 tablet 1  . pravastatin (PRAVACHOL) 20 MG tablet TAKE 1 TABLET BY MOUTH AT  BEDTIME 90 tablet 3   No current facility-administered medications on file prior to visit.    There are no Patient Instructions on file for this visit. No follow-ups on file.   Georgiana SpinnerFallon E Merril Nagy, NP

## 2020-02-25 ENCOUNTER — Ambulatory Visit
Admission: RE | Admit: 2020-02-25 | Discharge: 2020-02-25 | Disposition: A | Discharge status: Home / Self Care | Payer: Medicare Other | Source: Ambulatory Visit | Attending: Family | Admitting: Family

## 2020-02-25 ENCOUNTER — Other Ambulatory Visit: Payer: Self-pay

## 2020-02-25 DIAGNOSIS — Z1231 Encounter for screening mammogram for malignant neoplasm of breast: Secondary | ICD-10-CM | POA: Diagnosis not present

## 2020-02-27 ENCOUNTER — Ambulatory Visit: Payer: Medicare Other | Admitting: Family

## 2020-03-09 DIAGNOSIS — H04123 Dry eye syndrome of bilateral lacrimal glands: Secondary | ICD-10-CM | POA: Diagnosis not present

## 2020-03-12 ENCOUNTER — Encounter: Payer: Self-pay | Admitting: Family

## 2020-03-12 ENCOUNTER — Telehealth (INDEPENDENT_AMBULATORY_CARE_PROVIDER_SITE_OTHER): Payer: Medicare Other | Admitting: Family

## 2020-03-12 DIAGNOSIS — I10 Essential (primary) hypertension: Secondary | ICD-10-CM

## 2020-03-12 DIAGNOSIS — K559 Vascular disorder of intestine, unspecified: Secondary | ICD-10-CM | POA: Diagnosis not present

## 2020-03-12 DIAGNOSIS — I2510 Atherosclerotic heart disease of native coronary artery without angina pectoris: Secondary | ICD-10-CM | POA: Diagnosis not present

## 2020-03-12 NOTE — Assessment & Plan Note (Signed)
S/p sma stent 01/2020. Continues to follow with vascular, will follow

## 2020-03-12 NOTE — Assessment & Plan Note (Signed)
Overall controlled. A bit labile however limited by soft diastolic blood pressure in increasing antihypertensive regimen. Will continue regimen and consult with cardiology if escalates

## 2020-03-12 NOTE — Assessment & Plan Note (Addendum)
Pending lipid panel. H/o stent. She has follow up with Dr Lorenz Coaster in November, will follow

## 2020-03-12 NOTE — Progress Notes (Signed)
Verbal consent for services obtained from patient prior to services given to TELEPHONE visit:   Location of call:  provider at work patient at home  Names of all persons present for services: Rennie Plowman, NP and patient Chief complaint:   No recurrence of chest pain or syncope episodes. Feels well today. No concerns  Hasnt any palpitations  HLD- compliant with pravachol  HTN- compliant with amlodipine, clonidine, losartan, coreg Paroxysmal atrial fibrillation- compliant with eliquis, coreg.   Mesenteric ischemia- status post recent SMA stenting last month. Appetite improved   Syncope Chest pain Recent follow up with cardiology 01/29/20 Stress test normal 12/31/19  Crt one month ago 0.97  History, background, results pertinent:  BP Readings from Last 3 Encounters:  03/12/20 137/66  02/17/20 (!) 179/61  01/29/20 (!) 126/54   HR 66  A/P/next steps:  Problem List Items Addressed This Visit      Cardiovascular and Mediastinum   CAD (coronary artery disease)    Pending lipid panel. H/o stent. She has follow up with Dr Lorenz Coaster in November, will follow      Relevant Orders   Lipid panel   Hypertension    Overall controlled. A bit labile however limited by soft diastolic blood pressure in increasing antihypertensive regimen. Will continue regimen and consult with cardiology if escalates      Mesenteric ischemia (HCC)    S/p sma stent 01/2020. Continues to follow with vascular, will follow         I spent 15 min  discussing plan of care over the phone.

## 2020-03-19 ENCOUNTER — Encounter (INDEPENDENT_AMBULATORY_CARE_PROVIDER_SITE_OTHER): Payer: Medicare Other

## 2020-03-19 ENCOUNTER — Ambulatory Visit (INDEPENDENT_AMBULATORY_CARE_PROVIDER_SITE_OTHER): Payer: Medicare Other | Admitting: Nurse Practitioner

## 2020-03-23 ENCOUNTER — Other Ambulatory Visit: Payer: Self-pay | Admitting: *Deleted

## 2020-03-23 DIAGNOSIS — I480 Paroxysmal atrial fibrillation: Secondary | ICD-10-CM

## 2020-03-23 MED ORDER — APIXABAN 5 MG PO TABS: 5.0000 mg | ORAL_TABLET | Freq: Two times a day (BID) | ORAL | 1 refills | Status: DC

## 2020-03-23 NOTE — Telephone Encounter (Signed)
Eliquis 5mg  paper refill request received. Patient is 86 years old, weight-66.9kg, Crea-0.97 on 01/15/2020, Diagnosis-Afib, and last seen by 03/17/2020 on 01/29/2020. Dose is appropriate based on dosing criteria. Will send in refill to requested pharmacy.

## 2020-03-29 DIAGNOSIS — S2241XA Multiple fractures of ribs, right side, initial encounter for closed fracture: Secondary | ICD-10-CM | POA: Diagnosis not present

## 2020-04-06 ENCOUNTER — Other Ambulatory Visit: Payer: Self-pay | Admitting: Family

## 2020-04-06 DIAGNOSIS — I10 Essential (primary) hypertension: Secondary | ICD-10-CM

## 2020-04-06 DIAGNOSIS — E039 Hypothyroidism, unspecified: Secondary | ICD-10-CM

## 2020-04-19 DIAGNOSIS — I779 Disorder of arteries and arterioles, unspecified: Secondary | ICD-10-CM | POA: Diagnosis not present

## 2020-04-19 DIAGNOSIS — I739 Peripheral vascular disease, unspecified: Secondary | ICD-10-CM | POA: Diagnosis not present

## 2020-04-19 DIAGNOSIS — Z1389 Encounter for screening for other disorder: Secondary | ICD-10-CM | POA: Diagnosis not present

## 2020-04-19 DIAGNOSIS — N1832 Chronic kidney disease, stage 3b: Secondary | ICD-10-CM | POA: Diagnosis not present

## 2020-04-19 DIAGNOSIS — I5189 Other ill-defined heart diseases: Secondary | ICD-10-CM | POA: Insufficient documentation

## 2020-04-19 DIAGNOSIS — R202 Paresthesia of skin: Secondary | ICD-10-CM | POA: Diagnosis not present

## 2020-04-19 DIAGNOSIS — I2510 Atherosclerotic heart disease of native coronary artery without angina pectoris: Secondary | ICD-10-CM | POA: Diagnosis not present

## 2020-04-19 DIAGNOSIS — I1310 Hypertensive heart and chronic kidney disease without heart failure, with stage 1 through stage 4 chronic kidney disease, or unspecified chronic kidney disease: Secondary | ICD-10-CM | POA: Diagnosis not present

## 2020-04-19 DIAGNOSIS — I480 Paroxysmal atrial fibrillation: Secondary | ICD-10-CM | POA: Diagnosis not present

## 2020-04-19 DIAGNOSIS — E039 Hypothyroidism, unspecified: Secondary | ICD-10-CM | POA: Diagnosis not present

## 2020-04-19 DIAGNOSIS — Z0000 Encounter for general adult medical examination without abnormal findings: Secondary | ICD-10-CM | POA: Diagnosis not present

## 2020-04-21 ENCOUNTER — Ambulatory Visit: Payer: Medicare Other

## 2020-04-27 DIAGNOSIS — H903 Sensorineural hearing loss, bilateral: Secondary | ICD-10-CM | POA: Diagnosis not present

## 2020-05-03 ENCOUNTER — Telehealth: Payer: Self-pay

## 2020-05-03 NOTE — Telephone Encounter (Signed)
Pt said she changed providers to Dr. Graciela Husbands at [facility]. I removed Margaret as pcp.

## 2020-05-04 NOTE — Telephone Encounter (Signed)
FYI

## 2020-05-04 NOTE — Telephone Encounter (Signed)
noted 

## 2020-05-13 DIAGNOSIS — H905 Unspecified sensorineural hearing loss: Secondary | ICD-10-CM | POA: Diagnosis not present

## 2020-05-23 NOTE — Progress Notes (Signed)
Cardiology Office Note  Date:  05/24/2020   ID:  Kayla [name], DOB December 30, 1926, MRN [id number]  PCP:  Lynnea Ferrier, MD   Chief Complaint  Patient presents with  . Follow-up    6 months  Pt states no new Sx.    HPI:  Kayla [name] is an 86 y/o woman with h/o  CAD s/p CABG in 2001 at [facility], LBBB,  hyperlipidemia,  GERD   HTN.  Myoview in 2009 was normal.  Mild aortic valve stenosis History of PVCs Here for routine f/u Of her coronary artery disease and hypertension, new atrial fibrillation  In follow-up today reports that she feels relatively well Scheduled to see vein and vascular for lower extremity swelling When legs are wrapped not a problem, when legs are down she has more swelling Does not like to wear compression hose, difficulty getting them on  Recent fall, rib fracture on the right x2 last month, pain improving  EKG reviewed with her, in atrial fibrillation, asymptomatic Denies any tachypalpitations  Blood pressure at home typically 130 systolic Denies any dizziness, no orthostasis symptoms  EKG personally reviewed by myself on todays visit Atrial fibrillation with left bundle branch block PVCs rate 100 bpm  Other past medical history reviewed Seen in the emergency room November 14, 2019 Noted to have some lightheadedness/orthostasis Was given IV fluids, improvement of her symptoms EKG documenting atrial fibrillation, new finding Recommendation made to start Eliquis 5 twice daily, hold aspirin Coupon provided  Discussed previous syncope/dizziness episode in September 2020, wonders if it could have been atrial fibrillation Daughter reports that EMTs at the time thought she was in atrial fibrillation but it resolved by the time she went to the emergency room  Carotid 40 -50 on the right <39 on the left   unwitnessed syncope 03/2019.   recently been started on isosorbide mononitrate for elevated BP.  sitting in a bath and began to feel dizzy.   he got  out of the bath and sat on the toilet.   syncopal episode falling off the toilet and striking her left shoulder.   Imdur was discontinued and she was placed back on amlodipine.    Echocardiogram   normal LV function Mild AI, TR, MR  PMH:   has a past medical history of Arthritis, Bundle branch block, left, CAD (coronary artery disease), Carotid arterial disease (HCC), Cholecystitis, Diastolic dysfunction, Emphysema lung (HCC), GERD (gastroesophageal reflux disease), Heart murmur, HOH (hard of hearing), Hyperlipidemia, Hypertension, Hypothyroidism, Orthostatic headache, PAF (paroxysmal atrial fibrillation) (HCC), Pain, Superior mesenteric artery stenosis (HCC), Valvular heart disease, and Vertigo.  PSH:    Past Surgical History:  Procedure Laterality Date  . APPENDECTOMY  1957  . BREAST BIOPSY Left 2011   neg, stereo done by Dr. Evette Cristal  . BREAST BIOPSY    . CARDIAC CATHETERIZATION  2000   [facility]  . CATARACT EXTRACTION Right    right 2004, left 2018   . CATARACT EXTRACTION W/PHACO Left 04/12/2017   Procedure: CATARACT EXTRACTION PHACO AND INTRAOCULAR LENS PLACEMENT (IOC);  Surgeon: Lockie Mola, MD;  Location: [facility] ORS;  Service: Ophthalmology;  Laterality: Left;  [place] 01:09.0AP% 23.7CDE 16.35FLUID PACK LOT # [id number] H  . CHOLECYSTECTOMY N/A 04/25/2016   Procedure: LAPAROSCOPIC CHOLECYSTECTOMY WITH INTRAOPERATIVE CHOLANGIOGRAM;  Surgeon: Kieth Brightly, MD;  Location: [facility] ORS;  Service: General;  Laterality: N/A;  . CORONARY ANGIOPLASTY     STENT  . CORONARY ARTERY BYPASS GRAFT  2001  . KNEE SURGERY    .  thyroid  1970  . UPPER GI ENDOSCOPY  2014  . VISCERAL ANGIOGRAPHY N/A 01/15/2020   Procedure: VISCERAL ANGIOGRAPHY;  Surgeon: Annice Needy, MD;  Location: [facility] INVASIVE CV LAB;  Service: Cardiovascular;  Laterality: N/A;    Current Outpatient Medications  Medication Sig Dispense Refill  . amLODipine (NORVASC) 10 MG tablet TAKE 1 TABLET BY MOUTH  DAILY 90 tablet 3  .  carvedilol (COREG) 12.5 MG tablet TAKE 1 TABLET BY MOUTH  TWICE DAILY 180 tablet 3  . cloNIDine (CATAPRES) 0.1 MG tablet TAKE 1 TABLET BY MOUTH  TWICE DAILY 180 tablet 3  . Cyanocobalamin (VITAMIN B 12 PO) Take 3,000 Units by mouth daily.    . famotidine (PEPCID) 20 MG tablet TAKE 1 TABLET BY MOUTH  TWICE DAILY 180 tablet 3  . hydrALAZINE (APRESOLINE) 25 MG tablet TAKE 1 TABLET BY MOUTH 3  TIMES DAILY AS NEEDED FOR  SYSTOLIC BLOOD PRESSURE  ABOVE 150 270 tablet 1  . levothyroxine (SYNTHROID) 100 MCG tablet TAKE 1 TABLET BY MOUTH  DAILY BEFORE BREAKFAST 90 tablet 3  . losartan (COZAAR) 100 MG tablet TAKE 1 TABLET BY MOUTH  DAILY 90 tablet 3  . nitroGLYCERIN (NITROSTAT) 0.4 MG SL tablet Place 1 tablet (0.4 mg total) under the tongue every 5 (five) minutes as needed for chest pain. 25 tablet 1  . pravastatin (PRAVACHOL) 20 MG tablet TAKE 1 TABLET BY MOUTH AT  BEDTIME 90 tablet 3  . apixaban (ELIQUIS) 5 MG TABS tablet Take 1 tablet (5 mg total) by mouth 2 (two) times daily. 180 tablet 1   No current facility-administered medications for this visit.     Allergies:   Bee pollen, Boniva [ibandronic acid], Pollen extract, and Motrin [ibuprofen]   Social History:  The patient  reports that she has never smoked. She has never used smokeless tobacco. She reports that she does not drink alcohol and does not use drugs.   Family History:   family history includes Heart attack in her sister; Heart attack (age of onset: 34) in her brother; Heart failure (age of onset: 18) in her sister; Heart failure (age of onset: 100) in her mother.    Review of Systems: Review of Systems  Constitutional: Positive for malaise/fatigue.  HENT: Negative.   Respiratory: Negative.   Cardiovascular: Negative.   Gastrointestinal: Negative.   Musculoskeletal: Negative.   Neurological: Negative.   Psychiatric/Behavioral: Negative.   All other systems reviewed and are negative.   PHYSICAL EXAM: VS:  BP 130/66   Pulse 100    Ht 5\' 1"  (1.549 m)   Wt 149 lb (67.6 kg)   BMI 28.15 kg/m  , BMI Body mass index is 28.15 kg/m.  Constitutional:  oriented to person, place, and time. No distress.  HENT:  Head: Grossly normal Eyes:  no discharge. No scleral icterus.  Neck: No JVD, no carotid bruits  Cardiovascular:  Irregularly irregular,Nonpitting lower extremity edema no murmurs appreciated Pulmonary/Chest: Clear to auscultation bilaterally, no wheezes or rales Abdominal: Soft.  no distension.  no tenderness.  Musculoskeletal: Normal range of motion Neurological:  normal muscle tone. Coordination normal. 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 No further workup at this time. Continue current medication regimen.   Essential hypertension  Given worsening lower extremity edema, recommend she hold her amlodipine To maintain blood pressure suggested she increase clonidine up to 0.2 mg twice daily   Paroxysmal A. Fib Continues to have paroxysmal episodes, she is in atrial fibrillation on today's visit rate 100 bpm.  Reports yesterday heart rate in the 60s likely indicating normal sinus rhythm Suggested she take extra half dose Coreg 6.25 mg twice daily n addition to her 12 mg for total of 18.5 mg twice daily for days with heart rate greater than 90 Suggested to  the daughter that they purchase a pulse oximeter for the finger  Aortic valve stenosis, etiology of cardiac valve disease unspecified -  Mild aortic valve stenosis,  Periodic echocardiogram, last performed December 2020  Carotid stenosis, right 40-59% stenosis on the right,  Less than 39% blockage on the left,  Cholesterol at goal, periodic surveillance  Hyperlipidemia  continue current medications, numbers at goal   Total encounter time more than 25 minutes  Greater than 50% was spent in counseling and coordination of care with the patient   No orders of the defined types were placed in this encounter.    Signed, Dossie Arbour, M.D., Ph.D. 05/24/2020  [facility] [place], [place] [phone]

## 2020-05-24 ENCOUNTER — Ambulatory Visit: Payer: Medicare Other | Admitting: Cardiovascular Disease

## 2020-05-24 ENCOUNTER — Other Ambulatory Visit: Payer: Self-pay

## 2020-05-24 ENCOUNTER — Encounter: Payer: Self-pay | Admitting: Cardiovascular Disease

## 2020-05-24 ENCOUNTER — Other Ambulatory Visit (INDEPENDENT_AMBULATORY_CARE_PROVIDER_SITE_OTHER): Payer: Self-pay | Admitting: Nurse Practitioner

## 2020-05-24 VITALS — BP 130/66 | HR 100 | Ht 61.0 in | Wt 149.0 lb

## 2020-05-24 DIAGNOSIS — I480 Paroxysmal atrial fibrillation: Secondary | ICD-10-CM

## 2020-05-24 DIAGNOSIS — I2510 Atherosclerotic heart disease of native coronary artery without angina pectoris: Secondary | ICD-10-CM

## 2020-05-24 DIAGNOSIS — R200 Anesthesia of skin: Secondary | ICD-10-CM

## 2020-05-24 DIAGNOSIS — K559 Vascular disorder of intestine, unspecified: Secondary | ICD-10-CM

## 2020-05-24 DIAGNOSIS — I10 Essential (primary) hypertension: Secondary | ICD-10-CM | POA: Diagnosis not present

## 2020-05-24 DIAGNOSIS — K551 Chronic vascular disorders of intestine: Secondary | ICD-10-CM

## 2020-05-24 DIAGNOSIS — E785 Hyperlipidemia, unspecified: Secondary | ICD-10-CM | POA: Diagnosis not present

## 2020-05-24 MED ORDER — CARVEDILOL 12.5 MG PO TABS: 12.5000 mg | ORAL_TABLET | ORAL | 3 refills | Status: DC

## 2020-05-24 MED ORDER — CLONIDINE HCL 0.2 MG PO TABS: 0.2000 mg | ORAL_TABLET | Freq: Two times a day (BID) | ORAL | 3 refills | Status: DC

## 2020-05-24 NOTE — Patient Instructions (Addendum)
Medication Instructions:  Your physician has recommended you make the following change in your medication:  1. STOP amlodipine  2. INCREASE Clonidine to 0.2 mg twice a day 3. CONTINUE Carvediolol 12.5 mg twice a day  If heart rate is higher than 90 and looks like AFIB then take Extra 1/2 carvediolol twice a day but stop once heart rate  Gets better.   If you need a refill on your cardiac medications before your next appointment, please call your pharmacy.    Lab work: No new labs needed   If you have labs (blood work) drawn today and your tests are completely normal, you will receive your results only by: [name] MyChart Message (if you have MyChart) OR . A paper copy in the mail If you have any lab test that is abnormal or we need to change your treatment, we will call you to review the results.   Testing/Procedures: No new testing needed   Follow-Up: At [facility], you and your health needs are our priority.  As part of our continuing mission to provide you with exceptional heart care, we have created designated Provider Care Teams.  These Care Teams include your primary Cardiologist (physician) and Advanced Practice Providers (APPs -  Physician Assistants and Nurse Practitioners) who all work together to provide you with the care you need, when you need it.  . You will need a follow up appointment in 6 months  . Providers on your designated Care Team:   . Nicolasa Ducking, NP . Eula Listen, PA-C . Marisue Ivan, PA-C  Any Other Special Instructions Will Be Listed Below (If Applicable).  COVID-19 Vaccine Information can be found at: [email or web address] For questions related to vaccine distribution or appointments, please email [email or web address] or call [phone].

## 2020-05-25 ENCOUNTER — Ambulatory Visit (INDEPENDENT_AMBULATORY_CARE_PROVIDER_SITE_OTHER): Payer: Medicare Other

## 2020-05-25 ENCOUNTER — Ambulatory Visit (INDEPENDENT_AMBULATORY_CARE_PROVIDER_SITE_OTHER): Payer: Medicare Other | Admitting: Vascular Surgery

## 2020-05-25 ENCOUNTER — Encounter (INDEPENDENT_AMBULATORY_CARE_PROVIDER_SITE_OTHER): Payer: Self-pay | Admitting: Vascular Surgery

## 2020-05-25 ENCOUNTER — Other Ambulatory Visit: Payer: Self-pay | Admitting: Cardiovascular Disease

## 2020-05-25 VITALS — BP 129/69 | HR 56 | Resp 14 | Ht 61.0 in | Wt 150.0 lb

## 2020-05-25 DIAGNOSIS — M79609 Pain in unspecified limb: Secondary | ICD-10-CM | POA: Insufficient documentation

## 2020-05-25 DIAGNOSIS — K559 Vascular disorder of intestine, unspecified: Secondary | ICD-10-CM

## 2020-05-25 DIAGNOSIS — K551 Chronic vascular disorders of intestine: Secondary | ICD-10-CM

## 2020-05-25 DIAGNOSIS — E785 Hyperlipidemia, unspecified: Secondary | ICD-10-CM

## 2020-05-25 DIAGNOSIS — I10 Essential (primary) hypertension: Secondary | ICD-10-CM | POA: Diagnosis not present

## 2020-05-25 DIAGNOSIS — M79604 Pain in right leg: Secondary | ICD-10-CM | POA: Diagnosis not present

## 2020-05-25 DIAGNOSIS — R200 Anesthesia of skin: Secondary | ICD-10-CM | POA: Diagnosis not present

## 2020-05-25 DIAGNOSIS — M79605 Pain in left leg: Secondary | ICD-10-CM

## 2020-05-25 NOTE — Assessment & Plan Note (Signed)
blood pressure control important in reducing the progression of atherosclerotic disease. On appropriate oral medications.  

## 2020-05-25 NOTE — Progress Notes (Signed)
MRN : [id number]  Kayla [name] is a 86 y.o. (1926-12-21) female who presents with chief complaint of  Chief Complaint  Patient presents with  . Follow-up    ultrasound  .  History of Present Illness: Patient returns today in follow up of multiple issues.  She underwent an SMA stent placement earlier this year for chronic mesenteric ischemia.  Her abdomen is feeling better.  She is holding her weight well.  She still has some intermittent abdominal pain but it sounds more like reflux now than mesenteric symptoms.  Her duplex today shows normal velocities with a patent SMA stent status post intervention.  She also complains of lower extremity pain.  This is exacerbated by activity. ABIs were done today to evaluate if arterial perfusion was contributing.  These were normal with normal triphasic waveforms and ABIs of 1.12 bilaterally consistent with no arterial insufficiency.  Current Outpatient Medications  Medication Sig Dispense Refill  . amLODipine (NORVASC) 10 MG tablet TAKE 1 TABLET BY MOUTH  DAILY 90 tablet 3  . carvedilol (COREG) 12.5 MG tablet Take 1 tablet (12.5 mg total) by mouth as directed. Take 12.5 mg twice a day. If heart rate greater than 90 then take extra 1/2 tablet (18.75 mg) twice a day until it gets better then go back to 1 tablet (12.5 mg) twice a day. 180 tablet 3  . cloNIDine (CATAPRES) 0.2 MG tablet Take 1 tablet (0.2 mg total) by mouth 2 (two) times daily. 180 tablet 3  . Cyanocobalamin (VITAMIN B 12 PO) Take 3,000 Units by mouth daily.    . famotidine (PEPCID) 20 MG tablet TAKE 1 TABLET BY MOUTH  TWICE DAILY 180 tablet 3  . levothyroxine (SYNTHROID) 100 MCG tablet TAKE 1 TABLET BY MOUTH  DAILY BEFORE BREAKFAST 90 tablet 3  . losartan (COZAAR) 100 MG tablet TAKE 1 TABLET BY MOUTH  DAILY 90 tablet 3  . nitroGLYCERIN (NITROSTAT) 0.4 MG SL tablet Place 1 tablet (0.4 mg total) under the tongue every 5 (five) minutes as needed for chest pain. 25 tablet 1  . pravastatin  (PRAVACHOL) 20 MG tablet TAKE 1 TABLET BY MOUTH AT  BEDTIME 90 tablet 3  . apixaban (ELIQUIS) 5 MG TABS tablet Take 1 tablet (5 mg total) by mouth 2 (two) times daily. 180 tablet 1  . hydrALAZINE (APRESOLINE) 25 MG tablet TAKE 1 TABLET BY MOUTH 3  TIMES DAILY AS NEEDED FOR  SYSTOLIC BLOOD PRESSURE  ABOVE 150 270 tablet 3   No current facility-administered medications for this visit.    Past Medical History:  Diagnosis Date  . Arthritis   . Bundle branch block, left    Chronic  . CAD (coronary artery disease)    a. 2001 s/p CABG @ [facility]; b. 2009 Neg MV; c. 12/2019 MV: EF 52%, no ischemia.  . Carotid arterial disease (HCC)    a. 10/2019 Carotid U/S: RICA 40-59, LICA 1-39. F/u 2 yrs.  . Cholecystitis    from record  . Diastolic dysfunction    a. 06/2019 Echo: EF 55-60%, Gr2 DD.  [name] Emphysema lung (HCC)   . GERD (gastroesophageal reflux disease)   . Heart murmur   . HOH (hard of hearing)   . Hyperlipidemia   . Hypertension   . Hypothyroidism   . Orthostatic headache    Mild  . PAF (paroxysmal atrial fibrillation) (HCC)    a. Dx 10/2019 - converted spontaneously to sinus ED; b. CHA2DS2VASc = 5-->Eliquis.  . Pain  HIP AND LEG PAIN USES  CANE  . 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 03/2019 renal duplex w/ incidental finding of celia and SMA stenosis; b. 09/2019 Mesenteric Duplex: Nl Celiac, Hepatic, and Splenic arteries. 70-99% SMA stenosis-->Seen by vasc surgery; c. 01/2020 SMA 75-80%(7x26 balloon expandable stent). Celiac 80-90%.  . Valvular heart disease    a. 06/2019 Echo: EF 55-60%, Gr2 DD. No rwma. Nl RV fxn. Mild-mod MR. Mild TR/AI/PR. Mild to mod Ao sclerosis w/o stenosis.  . Vertigo     Past Surgical History:  Procedure Laterality Date  . APPENDECTOMY  1957  . BREAST BIOPSY Left 2011   neg, stereo done by Dr. Evette Cristal  . BREAST BIOPSY    . CARDIAC CATHETERIZATION  2000   [facility]  . CATARACT EXTRACTION Right    right 2004, left 2018   . CATARACT EXTRACTION W/PHACO  Left 04/12/2017   Procedure: CATARACT EXTRACTION PHACO AND INTRAOCULAR LENS PLACEMENT (IOC);  Surgeon: Lockie Mola, MD;  Location: [facility] ORS;  Service: Ophthalmology;  Laterality: Left;  [place] 01:09.0AP% 23.7CDE 16.35FLUID PACK LOT # [id number]  . CHOLECYSTECTOMY N/A 04/25/2016   Procedure: LAPAROSCOPIC CHOLECYSTECTOMY WITH INTRAOPERATIVE CHOLANGIOGRAM;  Surgeon: Kieth Brightly, MD;  Location: [facility] ORS;  Service: General;  Laterality: N/A;  . CORONARY ANGIOPLASTY     STENT  . CORONARY ARTERY BYPASS GRAFT  2001  . KNEE SURGERY    . thyroid  1970  . UPPER GI ENDOSCOPY  2014  . VISCERAL ANGIOGRAPHY N/A 01/15/2020   Procedure: VISCERAL ANGIOGRAPHY;  Surgeon: Annice Needy, MD;  Location: [facility] INVASIVE [facility];  Service: Cardiovascular;  Laterality: N/A;     Social History   Tobacco Use  . Smoking status: Never Smoker  . Smokeless tobacco: Never Used  Vaping Use  . Vaping Use: Never used  Substance Use Topics  . Alcohol use: No  . Drug use: No       Family History  Problem Relation Age of Onset  . Heart failure Mother 78  . Heart failure Sister 31  . Heart attack Sister   . Heart attack Brother 42  . Breast cancer Neg Hx      Allergies  Allergen Reactions  . Bee Pollen     Other reaction(s): Other (See Comments)  . Boniva [Ibandronic Acid]     Jaw pain  . Pollen Extract   . Motrin [Ibuprofen] Rash    REVIEW OF SYSTEMS (Negative unless checked)  Constitutional: [x] ?Weight loss  [] ?Fever  [] ?Chills Cardiac: [] ?Chest pain   [] ?Chest pressure   [] ?Palpitations   [] ?Shortness of breath when laying flat   [] ?Shortness of breath at rest   [] ?Shortness of breath with exertion. Vascular:  [x] ?Pain in legs with walking   [] ?Pain in legs at rest   [] ?Pain in legs when laying flat   [x] ?Claudication   [] ?Pain in feet when walking  [] ?Pain in feet at rest  [] ?Pain in feet when laying flat   [] ?History of DVT   [] ?Phlebitis   [] ?Swelling in legs   [] ?Varicose veins    [] ?Non-healing ulcers Pulmonary:   [] ?Uses home oxygen   [] ?Productive cough   [] ?Hemoptysis   [] ?Wheeze  [] ?COPD   [] ?Asthma Neurologic:  [] ?Dizziness  [] ?Blackouts   [] ?Seizures   [] ?History of stroke   [] ?History of TIA  [] ?Aphasia   [] ?Temporary blindness   [] ?Dysphagia   [] ?Weakness or numbness in arms   [] ?Weakness or numbness in legs Musculoskeletal:  [x] ?Arthritis   [] ?Joint swelling   [] ?  Joint pain   [] ?Low back pain Hematologic:  [] ?Easy bruising  [] ?Easy bleeding   [] ?Hypercoagulable state   [] ?Anemic   Gastrointestinal:  [] ?Blood in stool   [] ?Vomiting blood  [] ?Gastroesophageal reflux/heartburn   [x] ?Abdominal pain Genitourinary:  [] ?Chronic kidney disease   [] ?Difficult urination  [] ?Frequent urination  [] ?Burning with urination   [] ?Hematuria Skin:  [] ?Rashes   [] ?Ulcers   [] ?Wounds Psychological:  [] ?History of anxiety   [] ? History of major depression.  Physical Examination  BP 129/69 (BP Location: Right Arm)   Pulse (!) 56   Resp 14   Ht 5\' 1"  (1.549 m)   Wt 150 lb (68 kg)   BMI 28.34 kg/m  Gen:  WD/WN, NAD.  Appears younger than stated age Head: [place]/AT, No temporalis wasting. Ear/Nose/Throat: Hearing grossly intact, nares w/o erythema or drainage Eyes: Conjunctiva clear. Sclera non-icteric Neck: Supple.  Trachea midline Pulmonary:  Good air movement, no use of accessory muscles.  Cardiac: Irregular Vascular:  Vessel Right Left  Radial Palpable Palpable                          PT Palpable Palpable  DP Palpable Palpable   Gastrointestinal: soft, non-tender/non-distended. No guarding/reflex.  Musculoskeletal: M/S 5/5 throughout.  No deformity or atrophy.  Mild lower extremity edema. Neurologic: Sensation grossly intact in extremities.  Symmetrical.  Speech is fluent.  Psychiatric: Judgment intact, Mood & affect appropriate for pt's clinical situation. Dermatologic: No rashes or ulcers noted.  No cellulitis or open wounds.       Labs No results found  for this or any previous visit (from the past 2160 hour(s)).  Radiology VAS ABI WITH/WO TBI  Result Date: 05/25/2020 LOWER EXTREMITY DOPPLER STUDY Indications: Rest pain.  Performing Technologist: RVT  Examination Guidelines: A complete evaluation includes at minimum, Doppler waveform signals and systolic blood pressure reading at the level of bilateral brachial, anterior tibial, and posterior tibial arteries, when vessel segments are accessible. Bilateral testing is considered an integral part of a complete examination. Photoelectric Plethysmograph (PPG) waveforms and toe systolic pressure readings are included as required and additional duplex testing as needed. Limited examinations for reoccurring indications may be performed as noted.  ABI Findings: +---------+------------------+-----+---------+--------+ Right    Rt Pressure (mmHg)IndexWaveform Comment  +---------+------------------+-----+---------+--------+ Brachial 128                                      +---------+------------------+-----+---------+--------+ ATA      144               1.12 triphasic         +---------+------------------+-----+---------+--------+ PTA      133               1.04 triphasic         +---------+------------------+-----+---------+--------+ Great Toe109               0.85 Normal            +---------+------------------+-----+---------+--------+ +---------+------------------+-----+---------+-------+ Left     Lt Pressure (mmHg)IndexWaveform Comment +---------+------------------+-----+---------+-------+ Brachial 125                                     +---------+------------------+-----+---------+-------+ ATA      144  1.12 triphasic        +---------+------------------+-----+---------+-------+ PTA      138               1.08 triphasic        +---------+------------------+-----+---------+-------+ Great Toe111               0.87 Normal            +---------+------------------+-----+---------+-------+  Summary: Right: Resting right ankle-brachial index is within normal range. No evidence of significant right lower extremity arterial disease. The right toe-brachial index is normal. Left: Resting left ankle-brachial index is within normal range. No evidence of significant left lower extremity arterial disease. The left toe-brachial index is normal.  *See table(s) above for measurements and observations.  Electronically signed by Festus BarrenJason Allisen Pidgeon MD on 05/25/2020 at 10:23:55 AM.   Final    VAS US MESENTERIC  Result Date: 05/25/2020 ABDOMINAL VISCERAL Vascular Interventions: SMA stent on 01/15/20. Comparison Study: 8//09/2019 Performing Technologist: Salvadore Farbererry Knight RVT  Examination Guidelines: A complete evaluation includes B-mode imaging, spectral Doppler, color Doppler, and power Doppler as needed of all accessible portions of each vessel. Bilateral testing is considered an integral part of a complete examination. Limited examinations for reoccurring indications may be performed as noted.  Duplex Findings: +--------------------+--------+--------+------+--------+ Mesenteric          PSV cm/sEDV cm/sPlaqueComments +--------------------+--------+--------+------+--------+ Aorta at Celiac       102                          +--------------------+--------+--------+------+--------+ Celiac Artery Origin  203                          +--------------------+--------+--------+------+--------+ SMA Proximal          155                          +--------------------+--------+--------+------+--------+ SMA Mid               112                          +--------------------+--------+--------+------+--------+ CHA                    64                          +--------------------+--------+--------+------+--------+ Splenic               102                          +--------------------+--------+--------+------+--------+ IMA                    170                          +--------------------+--------+--------+------+--------+    Summary: Mesenteric: Normal Celiac artery , Superior Mesenteric artery, Inferior Mesenteric artery, Splenic artery and Hepatic artery findings. Evidence of patent SMA stents with ni evudence of significant stenosis.  *See table(s) above for measurements and observations.  Diagnosing physician: Festus BarrenJason Aliviana Burdell MD  Electronically signed by Festus BarrenJason Dawsyn Zurn MD on 05/25/2020 at 10:23:58 AM.    Final     Assessment/Plan Essential hypertension blood pressure control important in reducing the progression of atherosclerotic disease. On appropriate  oral medications.   Mixed hyperlipidemia lipid control important in reducing the progression of atherosclerotic disease. Continue statin therapy  Mesenteric ischemia (HCC) Her duplex today shows normal velocities with a patent SMA stent status post intervention.  Doing well.  We can extend her follow-up to 6 months at this point or sooner if problems develop in the interim.  Pain in limb The patient is having bilateral lower extremity pain that is not entirely clear in its etiology.  ABIs were done today to evaluate if arterial perfusion was contributing.  These were normal with normal triphasic waveforms and ABIs of 1.12 bilaterally consistent with no arterial insufficiency.  I suspect her leg pain is secondary to her arthritis in her back creating neurogenic claudication.  I will defer further work-up to her primary care physician.  Essential hypertension blood pressure control important in reducing the progression of atherosclerotic disease. On appropriate oral medications.     Festus Barren, MD  05/25/2020 11:10 AM    This note was created with Dragon medical transcription system.  Any errors from dictation are purely unintentional

## 2020-05-25 NOTE — Assessment & Plan Note (Signed)
The patient is having bilateral lower extremity pain that is not entirely clear in its etiology.  ABIs were done today to evaluate if arterial perfusion was contributing.  These were normal with normal triphasic waveforms and ABIs of 1.12 bilaterally consistent with no arterial insufficiency.  I suspect Kayla [name] leg pain is secondary to Kayla [name] arthritis in Kayla [name] back creating neurogenic claudication.  I will defer further work-up to Kayla [name] primary care physician.

## 2020-05-25 NOTE — Assessment & Plan Note (Signed)
Her duplex today shows normal velocities with a patent SMA stent status post intervention.  Doing well.  We can extend her follow-up to 6 months at this point or sooner if problems develop in the interim.

## 2020-05-31 ENCOUNTER — Telehealth: Payer: Self-pay | Admitting: Cardiovascular Disease

## 2020-05-31 NOTE — Telephone Encounter (Signed)
STAT if HR is under 50 or over 120 (normal HR is 60-100 beats per minute)  1) What is your heart rate? 49  2) Do you have a log of your heart rate readings (document readings)?   3) Do you have any other symptoms? asymptomatic   Patient was advised to let Dr.Gollan know if her HR goes above a certain number but never given a "do not let it get below" number. Patient curious if she should be concerned

## 2020-05-31 NOTE — Telephone Encounter (Signed)
If she is asymptomatic from the low heart rate would leave it for now and continue to monitor We are keeping it somewhat low as she has paroxysmal atrial fibrillation As mentioned last heart rate was A. fib rate 100  I am concerned a little of the high blood pressure and if that continues to run high we may need to increase hydralazine up to 50 mg from 25

## 2020-05-31 NOTE — Telephone Encounter (Signed)
Spoke to patient's daughter, ok per DPR.  She reports patient's HR has been around 49-55 over the past several days. No symptoms to report. She's doing great doing walking exercises down the hallway throughout the day. BP is labile and patient is taking hydralazine as prescribed. Reviewed med list and per daughter patient is taking correctly. 11/14 161/61  53 11/15 154/73  54  172/77  54  161/72  49  I looked back through Epic at office visits over the past several months with cardiology and PCP. Patient's HR was around 53-54 the majority of the time. Last office visit on 05/24/20 - HR was 100 and EKG showed A fib.   Advised that the HR is stable and to continue to monitor and to let us know if it drops much more below 49 on a long term basis. She verbalized understanding. Routing to Dr Mariah Milling for final review.

## 2020-06-01 NOTE — Telephone Encounter (Signed)
No answer. Left detailed message with recommendations, ok per DPR, and to call back if any questions. 

## 2020-06-15 ENCOUNTER — Telehealth: Payer: Self-pay | Admitting: Cardiovascular Disease

## 2020-06-15 NOTE — Telephone Encounter (Signed)
Pt c/o BP issue: STAT if pt c/o blurred vision, one-sided weakness or slurred speech  1. What are your last 5 BP readings?   168/64   181/85 167/79   2. Are you having any other symptoms (ex. Dizziness, headache, blurred vision, passed out)? Brady 50's - 60's   3. What is your BP issue? See previous phone note patient continues to have elevated pressure and brady

## 2020-06-16 NOTE — Telephone Encounter (Signed)
Spoke with patients daughter per release form. She states that patient has continued elevated blood pressures. She reports blood pressures as listed in other note and on 11/16 194/83 & 196/83. Daughter reports that patient had been on Amlodipine 10 mg for 20 years. Since starting the carvedilol her heart rates have been mid 50's to low 60's. Confirmed medications with her and patient is taking Hydralazine 25 mg three times a day as needed which she does take since pressures are elevated. She also states patient is on Carvedilol but not able to take extra dose due to heart rates and taking losartan.  Also discussed that she can take dose of nitroglycerin as well if needed for those higher pressures. Instructed her to resume Amlodipine for now and continue monitoring blood pressures. Let her know that I would send this note to provider for review and recommendations. She verbalized understanding of our conversation, agreement with plan, and had no further questions at this time.

## 2020-06-22 ENCOUNTER — Other Ambulatory Visit: Payer: Self-pay | Admitting: Family

## 2020-06-22 ENCOUNTER — Other Ambulatory Visit: Payer: Self-pay | Admitting: Cardiovascular Disease

## 2020-06-22 DIAGNOSIS — I10 Essential (primary) hypertension: Secondary | ICD-10-CM

## 2020-06-22 DIAGNOSIS — I2510 Atherosclerotic heart disease of native coronary artery without angina pectoris: Secondary | ICD-10-CM

## 2020-06-25 ENCOUNTER — Telehealth: Payer: Self-pay | Admitting: Cardiovascular Disease

## 2020-06-25 NOTE — Telephone Encounter (Signed)
Patients daughter calling in about never being called back regarding her mothers BP.   Pt c/o BP issue: STAT if pt c/o blurred vision, one-sided weakness or slurred speech  1. What are your last 5 BP readings? 161/75, 155/70, 159/72   2. Are you having any other symptoms (ex. Dizziness, headache, blurred vision, passed out)? No   3. What is your BP issue? Top number is high for about 2 weeks

## 2020-06-29 NOTE — Telephone Encounter (Signed)
Tried to reach out to pt and daughter [name] [facility]), left a VM to call back, pt may want to make an appointment if her BP still elevated after recent medication adjustment/advice given by Elita Quick, RN

## 2020-07-02 NOTE — Telephone Encounter (Signed)
Tried to reach back out to pt regarding her BP, left another message to call back with BP concerns.

## 2020-07-05 NOTE — Telephone Encounter (Signed)
Was able to get in touch with pt regarding her elevated BP that she called in about on 12/10, have tried to reach pt several times since then, but was not able to get in contact with pt, left VM to return call. At this time, pt reports her BP is "back to normal", has not been running high, no CP or shob. Otherwise all questions or concerns were address and no additional concerns at this time. Agreeable to plan, will call back for anything further.

## 2020-07-08 ENCOUNTER — Other Ambulatory Visit: Payer: Self-pay | Admitting: Cardiovascular Disease

## 2020-07-12 ENCOUNTER — Ambulatory Visit: Payer: Medicare Other | Admitting: Family

## 2020-07-30 ENCOUNTER — Telehealth: Payer: Self-pay | Admitting: Cardiovascular Disease

## 2020-07-30 NOTE — Telephone Encounter (Signed)
Return Kayla [name] (daughter by [facility]) call, her and her sister have been having confusion on pt's BP medications, yesterday pt took 3 of her  Hydralazine and her BP decrease to 96/47, but remain asystematic. Pt other daughter Kayla [name] has been managing most of pt's mediations and changes and there has been some confusion as to when and what she should take regarding her BP. This RN was able to review encounters, telephones notes, and medications regarding medications related to her BP, when and how to take, along with PRN Hydralazine for BP greater then 150 after other medications have be resourced as order, advised to wait 1 1/2-2 hours after taking medication before assessing medication and avoid exercise and caffeine before taking BP. Kayla [name] verbalized understanding, able to write down want BP meds to take and correct dosing so her and Kayla [name] can assist pt with proper medication regiment. Kayla [name] advised that pt's PCP placed her on chlorthalidone 25 mg daily 07/21/2020 for BP 190s systolic and fluid build up. This medication was added to pt's mediation list, advised to not take Hydralazine unless necessary to avoid low BP and HR. Advise for BP less than 90 systolic and pt systematic (weak, dizzy, confusion, fainting) then seek medical attention. Kayla [name] verbalized understanding, was able to repeat mediations. Is very greatly for time spent explaining pt's medication, Otherwise all questions or concerns were address and no additional concerns at this time. Agreeable to plan, will call back for anything further.

## 2020-07-30 NOTE — Telephone Encounter (Signed)
Patient daughter is calling to discuss Hydralazine  And Clonidine. States that patient took 3 Hydralazine and patient BP "bottomed out". Patient's  BP was 96/47 after taking Hydralazine. Please call to discuss.

## 2020-08-30 ENCOUNTER — Other Ambulatory Visit: Payer: Self-pay | Admitting: Cardiovascular Disease

## 2020-08-30 DIAGNOSIS — I480 Paroxysmal atrial fibrillation: Secondary | ICD-10-CM

## 2020-09-22 ENCOUNTER — Telehealth: Payer: Self-pay | Admitting: Cardiovascular Disease

## 2020-09-22 NOTE — Telephone Encounter (Signed)
Spoke with the patients daughter [name]. [name] sts that todays BP readings provided below 200/92 and 186/90 are prior to the patients morning medications. The patient did take her medications around 8am this morning. I asked [name] to recheck the pt BP while I held the line. [name] reports 138/69 66bpm.  Ranelle Oyster to ck the pt BP 2-3 hours after her medications. [name] sts that the BP readings given for 09/21/20 were after medications and the addition of the prn hydralazine 25 mg tid for sbp >150.   Patients BP today after meds are ok. Ranelle Oyster that I will fwd the update to Dr. Mariah Milling for further recommendation.

## 2020-09-22 NOTE — Telephone Encounter (Signed)
Pt c/o BP issue: STAT if pt c/o blurred vision, one-sided weakness or slurred speech  1. What are your last 5 BP readings?  3/8 188/79  173/75 3/9 200/92  186/90 all taken electronically   2. Are you having any other symptoms (ex. Dizziness, headache, blurred vision, passed out)? Dizzy spells, indigestion   3. What is your BP issue? BP has been up the last few days

## 2020-09-29 NOTE — Addendum Note (Signed)
Addended by: Maple Hudson on: 09/29/2020 02:05 PM   Modules accepted: Orders

## 2020-09-29 NOTE — Telephone Encounter (Signed)
Would make sure she is not taking her clonidine in the morning too late, you can get rebound hypertension May want to cut amlodipine and losartan in half , take half in the morning half in the p.m.

## 2020-09-29 NOTE — Telephone Encounter (Signed)
Was able to return call to [name] (daughter) regarding Dr. Windell Hummingbird advice. He suggested: "Would make sure she is not taking her clonidine in the morning too late, you can get rebound hypertension May want to cut amlodipine and losartan in half , take half in the morning half in the p.m." [name] reports Dr. Mariah Milling took her off amlodipine back in Nov. Per progress notes 05/24/20 Given worsening lower extremity edema, recommend she hold her amlodipine To maintain blood pressure suggested she increase clonidine up to 0.2 mg twice daily [name] reports will start to give morning dose of clonidine first thing in the am and break the losartan in half to give half dose am and pm to see if this improves Mrs. Stodghill breakthrough HTN. [name] grateful for the return call of Dr. Windell Hummingbird suggestions, will call back with anything other concerns or if BP continues to run high.

## 2020-10-11 ENCOUNTER — Other Ambulatory Visit (HOSPITAL_COMMUNITY): Payer: Self-pay | Admitting: Cardiovascular Disease

## 2020-10-11 DIAGNOSIS — I6523 Occlusion and stenosis of bilateral carotid arteries: Secondary | ICD-10-CM

## 2020-10-21 ENCOUNTER — Other Ambulatory Visit: Payer: Self-pay

## 2020-10-21 ENCOUNTER — Emergency Department
Admission: EM | Admit: 2020-10-21 | Discharge: 2020-10-21 | Disposition: A | Discharge status: Home / Self Care | Payer: Medicare Other | Attending: Emergency Medicine | Admitting: Emergency Medicine

## 2020-10-21 DIAGNOSIS — Z955 Presence of coronary angioplasty implant and graft: Secondary | ICD-10-CM | POA: Diagnosis not present

## 2020-10-21 DIAGNOSIS — Z7901 Long term (current) use of anticoagulants: Secondary | ICD-10-CM | POA: Insufficient documentation

## 2020-10-21 DIAGNOSIS — I2510 Atherosclerotic heart disease of native coronary artery without angina pectoris: Secondary | ICD-10-CM | POA: Insufficient documentation

## 2020-10-21 DIAGNOSIS — E039 Hypothyroidism, unspecified: Secondary | ICD-10-CM | POA: Insufficient documentation

## 2020-10-21 DIAGNOSIS — N189 Chronic kidney disease, unspecified: Secondary | ICD-10-CM | POA: Insufficient documentation

## 2020-10-21 DIAGNOSIS — Z79899 Other long term (current) drug therapy: Secondary | ICD-10-CM | POA: Insufficient documentation

## 2020-10-21 DIAGNOSIS — I480 Paroxysmal atrial fibrillation: Secondary | ICD-10-CM | POA: Insufficient documentation

## 2020-10-21 DIAGNOSIS — R030 Elevated blood-pressure reading, without diagnosis of hypertension: Secondary | ICD-10-CM | POA: Diagnosis present

## 2020-10-21 DIAGNOSIS — I129 Hypertensive chronic kidney disease with stage 1 through stage 4 chronic kidney disease, or unspecified chronic kidney disease: Secondary | ICD-10-CM | POA: Insufficient documentation

## 2020-10-21 DIAGNOSIS — I10 Essential (primary) hypertension: Secondary | ICD-10-CM

## 2020-10-21 LAB — CBC WITH DIFFERENTIAL/PLATELET
Abs Immature Granulocytes: 0.03 10*3/uL (ref 0.00–0.07)
Basophils Absolute: 0.1 10*3/uL (ref 0.0–0.1)
Basophils Relative: 1 %
Eosinophils Absolute: 0.3 10*3/uL (ref 0.0–0.5)
Eosinophils Relative: 4 %
HCT: 39.7 % (ref 36.0–46.0)
Hemoglobin: 13.1 g/dL (ref 12.0–15.0)
Immature Granulocytes: 0 %
Lymphocytes Relative: 22 %
Lymphs Abs: 1.5 10*3/uL (ref 0.7–4.0)
MCH: 30.5 pg (ref 26.0–34.0)
MCHC: 33 g/dL (ref 30.0–36.0)
MCV: 92.5 fL (ref 80.0–100.0)
Monocytes Absolute: 0.7 10*3/uL (ref 0.1–1.0)
Monocytes Relative: 10 %
Neutro Abs: 4.3 10*3/uL (ref 1.7–7.7)
Neutrophils Relative %: 63 %
Platelets: 172 10*3/uL (ref 150–400)
RBC: 4.29 MIL/uL (ref 3.87–5.11)
RDW: 14.1 % (ref 11.5–15.5)
WBC: 6.8 10*3/uL (ref 4.0–10.5)
nRBC: 0 % (ref 0.0–0.2)

## 2020-10-21 LAB — BASIC METABOLIC PANEL
Anion gap: 11 (ref 5–15)
BUN: 14 mg/dL (ref 8–23)
CO2: 23 mmol/L (ref 22–32)
Calcium: 9.3 mg/dL (ref 8.9–10.3)
Chloride: 102 mmol/L (ref 98–111)
Creatinine, Ser: 0.94 mg/dL (ref 0.44–1.00)
GFR, Estimated: 57 mL/min — ABNORMAL LOW (ref >60)
Glucose, Bld: 132 mg/dL — ABNORMAL HIGH (ref 70–99)
Potassium: 4.2 mmol/L (ref 3.5–5.1)
Sodium: 136 mmol/L (ref 135–145)

## 2020-10-21 LAB — BRAIN NATRIURETIC PEPTIDE: B Natriuretic Peptide: 730.8 pg/mL — ABNORMAL HIGH (ref 0.0–100.0)

## 2020-10-21 MED ORDER — LABETALOL HCL 5 MG/ML IV SOLN
10.0000 mg | Freq: Once | INTRAVENOUS | Status: AC
  Administered 2020-10-21: 10 mg via INTRAVENOUS
  Filled 2020-10-21: qty 4

## 2020-10-21 MED ORDER — HYDRALAZINE HCL 50 MG PO TABS
50.0000 mg | ORAL_TABLET | Freq: Once | ORAL | Status: AC
  Administered 2020-10-21: 50 mg via ORAL
  Filled 2020-10-21: qty 1

## 2020-10-21 NOTE — ED Notes (Signed)
Dr Larinda Buttery at bedside.  Daughter at bedside.

## 2020-10-21 NOTE — ED Provider Notes (Signed)
[facility] Emergency Department Provider Note   ____________________________________________   Event Date/Time   First MD Initiated Contact with Patient 10/21/20 1504     (approximate)  I have reviewed the triage vital signs and the nursing notes.   HISTORY  Chief Complaint Hypertension    HPI Kayla [name] is a 87 y.o. female with past medical history of hypertension, hyperlipidemia, CAD, CKD, paroxysmal A. fib on Eliquis, and mesenteric ischemia who presents to the ED complaining of hypertension.  Patient states that her family called the ambulance after her blood pressure was found to be very high earlier this morning.  She states the blood pressure was 214/93 and she took her "breakthrough blood pressure medication."  She is not sure the name of this medication and does not know the names of the medications she takes usually for her blood pressure.  She reports taking her usual medicines earlier this morning.  She was recently seen by her PCP due to blood pressure concerns and swelling in her legs, subsequently had her chlorthalidone stopped and her hydralazine increased to 50 mg 3 times daily.  She denies any fevers, cough, chest pain, shortness of breath, numbness, weakness, or difficulty urinating.  Chlorthalidone was stopped due to concerns with low sodium.        Past Medical History:  Diagnosis Date  . Arthritis   . Bundle branch block, left    Chronic  . CAD (coronary artery disease)    a. 2001 s/p CABG @ [facility]; b. 2009 Neg MV; c. 12/2019 MV: EF 52%, no ischemia.  . Carotid arterial disease (HCC)    a. 10/2019 Carotid U/S: RICA 40-59, LICA 1-39. F/u 2 yrs.  . Cholecystitis    from record  . Diastolic dysfunction    a. 06/2019 Echo: EF 55-60%, Gr2 DD.  [name] Emphysema lung (HCC)   . GERD (gastroesophageal reflux disease)   . Heart murmur   . HOH (hard of hearing)   . Hyperlipidemia   . Hypertension   . Hypothyroidism   . Orthostatic  headache    Mild  . PAF (paroxysmal atrial fibrillation) (HCC)    a. Dx 10/2019 - converted spontaneously to sinus ED; b. CHA2DS2VASc = 5-->Eliquis.  . Pain    HIP AND LEG PAIN USES  CANE  . 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 03/2019 renal duplex w/ incidental finding of celia and SMA stenosis; b. 09/2019 Mesenteric Duplex: Nl Celiac, Hepatic, and Splenic arteries. 70-99% SMA stenosis-->Seen by vasc surgery; c. 01/2020 SMA 75-80%(7x26 balloon expandable stent). Celiac 80-90%.  . Valvular heart disease    a. 06/2019 Echo: EF 55-60%, Gr2 DD. No rwma. Nl RV fxn. Mild-mod MR. Mild TR/AI/PR. Mild to mod Ao sclerosis w/o stenosis.  . Vertigo     Patient Active Problem List   Diagnosis Date Noted  . Pain in limb 05/25/2020  . Diastolic dysfunction 04/19/2020  . Paroxysmal atrial fibrillation (HCC) 04/19/2020  . Mesenteric ischemia (HCC) 03/12/2020  . CAD (coronary artery disease)   . Syncope 12/24/2019  . Atrial fibrillation (HCC) 11/26/2019  . Osteoarthritis of knee 10/24/2019  . Low grade fever 10/20/2019  . Chest pain 03/21/2019  . Osteoporosis 08/27/2017  . Carotid stenosis 08/08/2016  . Chronic kidney disease 08/08/2016  . Hypothyroidism 08/08/2016  . Hypertension 01/09/2013  . Essential hypertension 01/09/2013  . Hyperlipidemia 07/07/2011  . GERD (gastroesophageal reflux disease) 06/10/2010  . CAD (coronary artery disease), autologous vein bypass graft 04/15/2009  Past Surgical History:  Procedure Laterality Date  . APPENDECTOMY  1957  . BREAST BIOPSY Left 2011   neg, stereo done by Dr. Evette CristalSankar  . BREAST BIOPSY    . CARDIAC CATHETERIZATION  2000   [facility]  . CATARACT EXTRACTION Right    right 2004, left 2018   . CATARACT EXTRACTION W/PHACO Left 04/12/2017   Procedure: CATARACT EXTRACTION PHACO AND INTRAOCULAR LENS PLACEMENT (IOC);  Surgeon: Lockie MolaBrasington, Chadwick, MD;  Location: [facility] ORS;  Service: Ophthalmology;  Laterality: Left;  US 01:09.0AP% 23.7CDE 16.35FLUID  PACK LOT # [id number]  . CHOLECYSTECTOMY N/A 04/25/2016   Procedure: LAPAROSCOPIC CHOLECYSTECTOMY WITH INTRAOPERATIVE CHOLANGIOGRAM;  Surgeon: Kieth BrightlySeeplaputhur G Sankar, MD;  Location: [facility] ORS;  Service: General;  Laterality: N/A;  . CORONARY ANGIOPLASTY     STENT  . CORONARY ARTERY BYPASS GRAFT  2001  . KNEE SURGERY    . thyroid  1970  . UPPER GI ENDOSCOPY  2014  . VISCERAL ANGIOGRAPHY N/A 01/15/2020   Procedure: VISCERAL ANGIOGRAPHY;  Surgeon: Annice Needyew, Jason S, MD;  Location: [facility] INVASIVE CV LAB;  Service: Cardiovascular;  Laterality: N/A;    Prior to Admission medications   Medication Sig Start Date End Date Taking? Authorizing Provider  carvedilol (COREG) 12.5 MG tablet TAKE 1 TABLET BY MOUTH  TWICE DAILY 06/23/20   Allegra GranaArnett, Margaret G, FNP  chlorthalidone (HYGROTON) 25 MG tablet Take 25 mg by mouth daily. PCP placed pt on mediation for fluids build up 07/21/20   Curtis SitesKlein, Bert J III, MD  cloNIDine (CATAPRES) 0.2 MG tablet Take 1 tablet (0.2 mg total) by mouth 2 (two) times daily. 05/24/20   Antonieta IbaGollan, Timothy J, MD  cloNIDine (CATAPRES) 0.2 MG tablet Take 1 tablet (0.2 mg total) by mouth 2 (two) times daily. 07/12/20   Antonieta IbaGollan, Timothy J, MD  Cyanocobalamin (VITAMIN B 12 PO) Take 3,000 Units by mouth daily.    [provider]  ELIQUIS 5 MG TABS tablet TAKE 1 TABLET BY MOUTH  TWICE DAILY 08/31/20   Antonieta IbaGollan, Timothy J, MD  famotidine (PEPCID) 20 MG tablet TAKE 1 TABLET BY MOUTH  TWICE DAILY 04/07/20   Allegra GranaArnett, Margaret G, FNP  hydrALAZINE (APRESOLINE) 25 MG tablet TAKE 1 TABLET BY MOUTH 3  TIMES DAILY AS NEEDED FOR  SYSTOLIC BLOOD PRESSURE  ABOVE 150 05/25/20   Gollan, Tollie Pizzaimothy J, MD  levothyroxine (SYNTHROID) 100 MCG tablet TAKE 1 TABLET BY MOUTH  DAILY BEFORE BREAKFAST 04/07/20   Allegra GranaArnett, Margaret G, FNP  losartan (COZAAR) 100 MG tablet TAKE 1 TABLET BY MOUTH  DAILY 02/05/20   Allegra GranaArnett, Margaret G, FNP  nitroGLYCERIN (NITROSTAT) 0.4 MG SL tablet Place 1 tablet (0.4 mg total) under the tongue every 5 (five)  minutes as needed for chest pain. 03/21/19   Allegra GranaArnett, Margaret G, FNP  pravastatin (PRAVACHOL) 20 MG tablet TAKE 1 TABLET BY MOUTH AT  BEDTIME 06/23/20   Allegra GranaArnett, Margaret G, FNP    Allergies Bee pollen, Boniva [ibandronic acid], Pollen extract, and Motrin [ibuprofen]  Family History  Problem Relation Age of Onset  . Heart failure Mother 7876  . Heart failure Sister 2348  . Heart attack Sister   . Heart attack Brother 42  . Breast cancer Neg Hx     Social History Social History   Tobacco Use  . Smoking status: Never Smoker  . Smokeless tobacco: Never Used  Vaping Use  . Vaping Use: Never used  Substance Use Topics  . Alcohol use: No  . Drug use: No    Review  of Systems  Constitutional: No fever/chills.  Positive for elevated blood pressure. Eyes: No visual changes. ENT: No sore throat. Cardiovascular: Denies chest pain. Respiratory: Denies shortness of breath. Gastrointestinal: No abdominal pain.  No nausea, no vomiting.  No diarrhea.  No constipation. Genitourinary: Negative for dysuria. Musculoskeletal: Negative for back pain. Skin: Negative for rash. Neurological: Negative for headaches, focal weakness or numbness.  ____________________________________________   PHYSICAL EXAM:  VITAL SIGNS: ED Triage Vitals  Enc Vitals Group     BP --      Pulse --      Resp --      Temp --      Temp src --      SpO2 --      Weight 10/21/20 1510 170 lb (77.1 kg)     Height 10/21/20 1510 5\' 1"  (1.549 m)     Head Circumference --      Peak Flow --      Pain Score 10/21/20 1509 0     Pain Loc --      Pain Edu? --      Excl. in GC? --     Constitutional: Alert and oriented. Eyes: Conjunctivae are normal. Head: Atraumatic. Nose: No congestion/rhinnorhea. Mouth/Throat: Mucous membranes are moist. Neck: Normal ROM Cardiovascular: Normal rate, regular rhythm. Grossly normal heart sounds. Respiratory: Normal respiratory effort.  No retractions. Lungs CTAB. Gastrointestinal:  Soft and nontender. No distention. Genitourinary: deferred Musculoskeletal: No lower extremity tenderness with trace edema to bilateral lower extremities. Neurologic:  Normal speech and language. No gross focal neurologic deficits are appreciated. Skin:  Skin is warm, dry and intact. No rash noted. Psychiatric: Mood and affect are normal. Speech and behavior are normal.  ____________________________________________   LABS (all labs ordered are listed, but only abnormal results are displayed)  Labs Reviewed  BRAIN NATRIURETIC PEPTIDE - Abnormal; Notable for the following components:      Result Value   B Natriuretic Peptide 730.8 (*)    All other components within normal limits  BASIC METABOLIC PANEL - Abnormal; Notable for the following components:   Glucose, Bld 132 (*)    GFR, Estimated 57 (*)    All other components within normal limits  CBC WITH DIFFERENTIAL/PLATELET   ____________________________________________  EKG  ED ECG REPORT I, 12/21/20, the attending physician, personally viewed and interpreted this ECG.   Date: 10/21/2020  EKG Time: 15:14  Rate: 99  Rhythm: normal sinus rhythm  Axis: Normal  Intervals:left bundle branch block  ST&T Change: None, negative sgarbossa   PROCEDURES  Procedure(s) performed (including Critical Care):  Procedures   ____________________________________________   INITIAL IMPRESSION / ASSESSMENT AND PLAN / ED COURSE       87 year old female with past medical history of hypertension, hyperlipidemia, CAD, CKD, paroxysmal atrial fibrillation on Eliquis, and mesenteric ischemia who presents to the ED complaining of elevated blood pressure on check this morning.  She recently had her chlorthalidone stopped and had her dose of hydralazine increased.  It appears she has been taking the hydralazine as needed rather than 3 times daily and this is the likely cause of her elevated blood pressure.  She is asymptomatic with this  elevated blood pressure, EKG shows left bundle branch block similar to previous with no ischemic changes.  We will screen labs for any electrolyte abnormality or acute renal failure, but if these are unremarkable, patient will be appropriate for discharge home with PCP follow-up.  Lab work is reassuring and blood pressure gradually  improving, patient remains asymptomatic and is appropriate for discharge home with PCP or cardiology follow-up.  She was counseled to return to the ED for new worsening symptoms, patient and daughter agree with plan.      ____________________________________________   FINAL CLINICAL IMPRESSION(S) / ED DIAGNOSES  Final diagnoses:  Hypertension, unspecified type     ED Discharge Orders    None       Note:  This document was prepared using Dragon voice recognition software and may include unintentional dictation errors.   Chesley Noon, MD 10/21/20 [phone]

## 2020-10-21 NOTE — ED Triage Notes (Addendum)
Pt to ED for asymptomatic hypertension. Family called EMS. Daughter checked BP today, BP was 214/93. After this, pt took her "breakthrough BP medication". EMS also got SBP of 202. Pt does mnot have regular BP med that she takes. Daughter told EMS pt was taken off diuretic by doctor a few weeks ago. Daughter had pt take discontinued diuretic (possibly chlorthalidone, according to mychart), now pt is complaining of "tingly legs". Pt in NAD, able to transfer between stretchers. Bilateral LE edema noted with +1 pitting. Denies CP, SOB.

## 2020-10-21 NOTE — ED Notes (Signed)
Provided water and crackers. Daughter at bedside. Covered in warm blankets. Resting in bed with purewick in place. Pt in NAD.

## 2020-10-22 ENCOUNTER — Ambulatory Visit: Payer: Medicare Other | Admitting: Physician Assistant

## 2020-10-22 ENCOUNTER — Encounter: Payer: Self-pay | Admitting: Physician Assistant

## 2020-10-22 ENCOUNTER — Telehealth: Payer: Self-pay | Admitting: Cardiovascular Disease

## 2020-10-22 VITALS — BP 164/68 | HR 56 | Ht 61.0 in | Wt 151.0 lb

## 2020-10-22 DIAGNOSIS — I5033 Acute on chronic diastolic (congestive) heart failure: Secondary | ICD-10-CM

## 2020-10-22 DIAGNOSIS — I2510 Atherosclerotic heart disease of native coronary artery without angina pectoris: Secondary | ICD-10-CM

## 2020-10-22 DIAGNOSIS — I10 Essential (primary) hypertension: Secondary | ICD-10-CM

## 2020-10-22 DIAGNOSIS — K559 Vascular disorder of intestine, unspecified: Secondary | ICD-10-CM

## 2020-10-22 DIAGNOSIS — I6523 Occlusion and stenosis of bilateral carotid arteries: Secondary | ICD-10-CM

## 2020-10-22 DIAGNOSIS — I340 Nonrheumatic mitral (valve) insufficiency: Secondary | ICD-10-CM

## 2020-10-22 DIAGNOSIS — Z87898 Personal history of other specified conditions: Secondary | ICD-10-CM

## 2020-10-22 DIAGNOSIS — I480 Paroxysmal atrial fibrillation: Secondary | ICD-10-CM | POA: Diagnosis not present

## 2020-10-22 DIAGNOSIS — R9431 Abnormal electrocardiogram [ECG] [EKG]: Secondary | ICD-10-CM

## 2020-10-22 DIAGNOSIS — E785 Hyperlipidemia, unspecified: Secondary | ICD-10-CM

## 2020-10-22 DIAGNOSIS — I739 Peripheral vascular disease, unspecified: Secondary | ICD-10-CM

## 2020-10-22 DIAGNOSIS — I351 Nonrheumatic aortic (valve) insufficiency: Secondary | ICD-10-CM

## 2020-10-22 DIAGNOSIS — I5030 Unspecified diastolic (congestive) heart failure: Secondary | ICD-10-CM

## 2020-10-22 MED ORDER — CHLORTHALIDONE 25 MG PO TABS: 12.5000 mg | ORAL_TABLET | Freq: Every day | ORAL | Status: DC

## 2020-10-22 MED ORDER — VALSARTAN 160 MG PO TABS: 160.0000 mg | ORAL_TABLET | Freq: Every day | ORAL | 1 refills | Status: DC

## 2020-10-22 NOTE — Telephone Encounter (Signed)
Spoke with [facility] .  Advised to schedule patient in clinic today.   Patient daughter ok with coming in today for [place] at 1130.0

## 2020-10-22 NOTE — Telephone Encounter (Signed)
Pt c/o BP issue: STAT if pt c/o blurred vision, one-sided weakness or slurred speech  1. What are your last 5 BP readings?  205/73 this morning   2. Are you having any other symptoms (ex. Dizziness, headache, blurred vision, passed out)?   yesterday nausea extreme weakness today headache weakness and swelling   3. What is your BP issue? Patient went to ed to evaluate and was told to be seen in clinic asap

## 2020-10-22 NOTE — Patient Instructions (Addendum)
Medication Instructions:  Your physician has recommended you make the following change in your medication:   STOP Losartan  STOP Hydralazine (for now until further instructed by cardiology)   START Valsartan 160mg  daily  DECREASE Chlorthalidone to 12.5mg  - Take HALF tablet daily  *If you need a refill on your cardiac medications before your next appointment, please call your pharmacy*   Lab Work: None ordered   Testing/Procedures:  Your physician has requested that you have an echocardiogram. Echocardiography is a painless test that uses sound waves to create images of your heart. It provides your doctor with information about the size and shape of your heart and how well your heart's chambers and valves are working. This procedure takes approximately one hour. There are no restrictions for this procedure.    Follow-Up: At [facility], you and your health needs are our priority.  As part of our continuing mission to provide you with exceptional heart care, we have created designated Provider Care Teams.  These Care Teams include your primary Cardiologist (physician) and Advanced Practice Providers (APPs -  Physician Assistants and Nurse Practitioners) who all work together to provide you with the care you need, when you need it.  We recommend signing up for the patient portal called "MyChart".  Sign up information is provided on this After Visit Summary.  MyChart is used to connect with patients for Virtual Visits (Telemedicine).  Patients are able to view lab/test results, encounter notes, upcoming appointments, etc.  Non-urgent messages can be sent to your provider as well.   To learn more about what you can do with MyChart, go to [facility].    Your next appointment:   1 week(s)  The format for your next appointment:   In Person  Provider:   You may see [email or web address], MD or one of the following Advanced Practice Providers on your designated Care Team:     Julien Nordmann, NP  Nicolasa Ducking, PA-C  Eula Listen, PA-C  Cadence [place], [place]  [place], NP   Please monitor BP at home and call or send a message by MyChart in 1 week to give Gillian Shields an update on your blood pressure readings.

## 2020-10-22 NOTE — Progress Notes (Signed)
Office Visit    Patient Name: Kayla W Kayla GIRVANof Encounter: 10/22/2020  PCP:  Kayla Ferrier, MD   [facility]  Cardiologist:  Kayla Nordmann, MD  Advanced Practice Provider:  No care team member to display Electrophysiologist:  None  :[id number]}   Chief Complaint    Chief Complaint  Patient presents with  . Follow-up    S/p ED for symptomatic elevated BP---see tele note from this morning. Pt went to ED yesterday and was advised to f/u today with Cards. PCP stopped chlorthalidone d/t pt frequently urinating and low sodium levels; increased Hydralazine to 50 mg TID. Appears from ED note, pt not taking TID, but PRN.  Pt states she has not been taking Hydralazine TID. They got a new BP cuff yesterday.    87 year old female with history of CAD s/p CABG in 2001 ([facility]), left bundle branch block, hypertension, hyperlipidemia, diastolic dysfunction, mild to moderate valvular heart disease, carotid arterial disease, superior mesenteric artery stenosis/mesenteric ischemia, SVT, PVCs, PACs, paroxysmal atrial fibrillation, and seen today for symptomatic elevated blood pressure with recent trip to the emergency department after changes in medications by PCP.  Past Medical History    Past Medical History:  Diagnosis Date  . Arthritis   . Bundle branch block, left    Chronic  . CAD (coronary artery disease)    a. 2001 s/p CABG @ [facility]; b. 2009 Neg MV; c. 12/2019 MV: EF 52%, no ischemia.  . Carotid arterial disease (HCC)    a. 10/2019 Carotid U/S: RICA 40-59, LICA 1-39. F/u 2 yrs.  . Cholecystitis    from record  . Diastolic dysfunction    a. 06/2019 Echo: EF 55-60%, Gr2 DD.  [name] Emphysema lung (HCC)   . GERD (gastroesophageal reflux disease)   . Heart murmur   . HOH (hard of hearing)   . Hyperlipidemia   . Hypertension   . Hypothyroidism   . Orthostatic headache    Mild  . PAF (paroxysmal atrial fibrillation) (HCC)    a. Dx 10/2019 - converted  spontaneously to sinus ED; b. CHA2DS2VASc = 5-->Eliquis.  . Pain    HIP AND LEG PAIN USES  CANE  . 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 03/2019 renal duplex w/ incidental finding of celia and SMA stenosis; b. 09/2019 Mesenteric Duplex: Nl Celiac, Hepatic, and Splenic arteries. 70-99% SMA stenosis-->Seen by vasc surgery; c. 01/2020 SMA 75-80%(7x26 balloon expandable stent). Celiac 80-90%.  . Valvular heart disease    a. 06/2019 Echo: EF 55-60%, Gr2 DD. No rwma. Nl RV fxn. Mild-mod MR. Mild TR/AI/PR. Mild to mod Ao sclerosis w/o stenosis.  . Vertigo    Past Surgical History:  Procedure Laterality Date  . APPENDECTOMY  1957  . BREAST BIOPSY Left 2011   neg, stereo done by Dr. Evette [name]  . BREAST BIOPSY    . CARDIAC CATHETERIZATION  2000   [facility]  . CATARACT EXTRACTION Right    right 2004, left 2018   . CATARACT EXTRACTION W/PHACO Left 04/12/2017   Procedure: CATARACT EXTRACTION PHACO AND INTRAOCULAR LENS PLACEMENT (IOC);  Surgeon: Kayla Mola, MD;  Location: [facility] ORS;  Service: Ophthalmology;  Laterality: Left;  [place] 01:09.0AP% 23.7CDE 16.35FLUID PACK LOT # [id number]  . CHOLECYSTECTOMY N/A 04/25/2016   Procedure: LAPAROSCOPIC CHOLECYSTECTOMY WITH INTRAOPERATIVE CHOLANGIOGRAM;  Surgeon: Kayla Brightly, MD;  Location: [facility] ORS;  Service: General;  Laterality: N/A;  . CORONARY ANGIOPLASTY     STENT  .  CORONARY ARTERY BYPASS GRAFT  2001  . KNEE SURGERY    . thyroid  1970  . UPPER GI ENDOSCOPY  2014  . VISCERAL ANGIOGRAPHY N/A 01/15/2020   Procedure: VISCERAL ANGIOGRAPHY;  Surgeon: Kayla Needy, MD;  Location: [facility] INVASIVE CV LAB;  Service: Cardiovascular;  Laterality: N/A;    Allergies  Allergies  Allergen Reactions  . Bee Pollen     Other reaction(s): Other (See Comments)  . Boniva [Ibandronic Acid]     Jaw pain  . Pollen Extract   . Motrin [Ibuprofen] Rash    History of Present Illness    Kayla [name] is a 87 y.o. female with PMH as above.    06/2019 echo  showed normal LV SF, G2 DD, mild to moderate valvular disease.    She was diagnosed with PAF 10/2019 and placed on Eliquis.  She reports bruising on Eliquis.  When seen in June 2021, she reported a syncopal episode that occurred while she was at her grandson's graduation party.  She had had a 5-hour episode of chest pain earlier that morning.  She was evaluated in the ED same day.  She was not interested in repeat event monitoring at follow-up.  Repeat MPI 12/2019 low risk.    She underwent SMA stenting 01/2020.  This resulted in improvement in her earlier abdominal discomfort and nausea/emesis.  At subsequent office visits, she noted improvement in chest pain. She has LE arterial dz as below. 05/2020 Mesenteric and LE arterial scans as below.   She has chronic DOE.  At previous visits, she did note some DOE with walking up the inclines around her house.   When last seen in the office 05/2020, she was scheduled to see vein and vascular surgery for lower extremity edema.  She did not like to wear compression hose.  She had had a recent fall with rib fracture on the right side twice in the previous month with pain slightly improved at her visit.  EKG showed atrial fibrillation with patient asymptomatic.  BP at home was noted to be 130s.  Given worsening lower extremity edema, recommendation was to hold amlodipine.  Clonidine was increased to 0.2 mg twice daily.  It was recommended she take an extra half dose of carvedilol 6.25 mg twice daily with heart rate greater than 90 bpm in atrial fibrillation (increasing her to a total of Coreg 18.5 mg twice daily on those days).  It was recommended to the patient's daughter that they purchase a pulse oximeter to monitor her rate.  Since her last visit, the patient's PCP increased hydralazine to 50 mg 3 times daily for more BP support and discontinued chlorthalidone due to low sodium.  Between visits, the patient called the office about her losartan with  recommendation to take 50 mg twice per day.  However, on discussion today, she is only taking 50 mg daily now.  On 10/21/2020, she went to the emergency department for elevated blood pressure, reportedly elevated after her PCP had made some medication changes.  The patient reported systolic in the 180s to 200s and suboptimal response to her breakthrough blood pressure medication, though she was unable to recall what those medications were in the ED. EKG NSR.  On further questioning about her medications, it was suspected/discovered the patient was taking hydralazine as needed, rather than 3 times daily.  Significant medication confusion was noted.   At discharge, recommendation was that the patient follow-up with her PCP, but the daughter preferred for her  mother to follow-up with heart care first.  Today, 10/22/2020, she returns to clinic with BP 164/68.  It is noted that she recently discovered her blood pressure cuff is inaccurate at home.  She replaced the blood pressure cuff and readings have subsequently been lower on her log.  As previously noted in the ED, there is a great deal of medication confusion at baseline.  The patient's daughter notes the patient has some memory issues--the patient self administers her breakthrough BP medication. She is not able to tell me which are her breakthrough medications.  The patient receives care from both daughters, 1 of which does not like hydralazine, so this medication is not administered on days she cares for the patient.  On review of the BP log, even though chlorthalidone has been stopped, it is noted as still being administered at times.  Some medication changes recently made over the phone do appear confused.  It is difficult for the patient and daughter to track which medications occur at what time.  The patient's daughter provides most of the history and ROS today for her mother.  With consideration of any memory issues, the patient denies chest pain, shortness  of breath, numbness, weakness.  She does report unchanged dyspnea and lower extremity edema.  No reported presyncope or syncope.  No recent falls.  She denies any signs or symptoms of bleeding.  Per the blood pressure log brought into the office -SBP 130s to 190s and DBP 50s to 100s.  Heart rate 50s to 90s.  Home Medications    Current Outpatient Medications on File Prior to Visit  Medication Sig Dispense Refill  . carvedilol (COREG) 12.5 MG tablet TAKE 1 TABLET BY MOUTH  TWICE DAILY 180 tablet 3  . chlorthalidone (HYGROTON) 25 MG tablet Take 25 mg by mouth daily. PCP placed pt on mediation for fluids build up    . cloNIDine (CATAPRES) 0.2 MG tablet Take 1 tablet (0.2 mg total) by mouth 2 (two) times daily. 180 tablet 3  . Cyanocobalamin (VITAMIN B 12 PO) Take 3,000 Units by mouth daily.    [name] ELIQUIS 5 MG TABS tablet TAKE 1 TABLET BY MOUTH  TWICE DAILY 180 tablet 1  . famotidine (PEPCID) 20 MG tablet TAKE 1 TABLET BY MOUTH  TWICE DAILY 180 tablet 3  . hydrALAZINE (APRESOLINE) 50 MG tablet Take 50 mg by mouth 3 (three) times daily.    [name] levothyroxine (SYNTHROID) 112 MCG tablet Take 112 mcg by mouth every morning.    [name] losartan (COZAAR) 100 MG tablet TAKE 1 TABLET BY MOUTH  DAILY 90 tablet 3  . nitroGLYCERIN (NITROSTAT) 0.4 MG SL tablet Place 1 tablet (0.4 mg total) under the tongue every 5 (five) minutes as needed for chest pain. 25 tablet 1  . pravastatin (PRAVACHOL) 20 MG tablet TAKE 1 TABLET BY MOUTH AT  BEDTIME 90 tablet 3   No current facility-administered medications on file prior to visit.    Review of Systems    She denies chest pain, palpitations, dyspnea, pnd, orthopnea, n, v, dizziness, syncope, edema, weight gain, or early satiety.   All other systems reviewed and are otherwise negative except as noted above.  Physical Exam    VS:  BP (!) 175/73 Comment: on pt BP cuff  Pulse (!) 56   Ht 5\' 1"  (1.549 m)   Wt 151 lb (68.5 kg)   BMI 28.53 kg/m  , BMI Body mass index is 28.53  kg/m. GEN: Elderly female, in no  acute distress. HEENT: normal. Neck: Supple, JVD difficult to assess due to position of patient in a chair.  No, carotid bruits, or masses. Cardiac: Bradycardic but regular, 2/6 systolic murmur RUSB.  No, rubs, or gallops. No clubbing, cyanosis.  Bilateral moderate to 1+ edema.  Radials/DP/PT 2+ and equal bilaterally.  Respiratory: Poor inspiratory effort, bilateral reduced breath sounds. GI: Soft, nontender, nondistended, BS + x 4. MS: no deformity or atrophy. Skin: warm and dry, no rash. Neuro: At times, she appears confused.  Strength and sensation are intact. Psych: At times, she appears confused.  Normal affect.  Accessory Clinical Findings    ECG personally reviewed by me today -sinus bradycardia, IVCD, ST/T changes noted in the inferior and lateral leads, ST elevation in the precordial leads attributed to repolarization/LVH with repolarization abnormality, poor R wave progression V1-V4, T wave abnormalities noted in the inferior/lateral leads/AVR/V1 -V2 reviewed with DOD/primary cardiologist same day with recommendation to consider lead placement versus elevated BP/volume status.- no acute changes.  VITALS Reviewed today   Temp Readings from Last 3 Encounters:  10/21/20 98.6 F (37 C) (Oral)  12/24/19 (!) 96.2 F (35.7 C)  12/20/19 98 F (36.7 C) (Oral)   BP Readings from Last 3 Encounters:  10/22/20 (!) 175/73  10/21/20 (!) 187/77  05/25/20 129/69   Pulse Readings from Last 3 Encounters:  10/22/20 (!) 56  10/21/20 92  05/25/20 (!) 56    Wt Readings from Last 3 Encounters:  10/22/20 151 lb (68.5 kg)  10/21/20 170 lb (77.1 kg)  05/25/20 150 lb (68 kg)     LABS  reviewed today   Lab Results  Component Value Date   WBC 6.8 10/21/2020   HGB 13.1 10/21/2020   HCT 39.7 10/21/2020   MCV 92.5 10/21/2020   PLT 172 10/21/2020   Lab Results  Component Value Date   CREATININE 0.94 10/21/2020   BUN 14 10/21/2020   NA 136 10/21/2020    K 4.2 10/21/2020   CL 102 10/21/2020   CO2 23 10/21/2020   Lab Results  Component Value Date   ALT 12 10/21/2019   ALT 11 10/21/2019   AST 15 10/21/2019   AST 15 10/21/2019   ALKPHOS 42 10/21/2019   ALKPHOS 42 10/21/2019   BILITOT 1.3 (H) 10/21/2019   BILITOT 1.1 10/21/2019   Lab Results  Component Value Date   CHOL 114 07/29/2018   HDL 54.60 07/29/2018   LDLCALC 45 07/29/2018   TRIG 68.0 07/29/2018   CHOLHDL 2 07/29/2018    Lab Results  Component Value Date   HGBA1C 6.1 (H) 12/24/2019   Lab Results  Component Value Date   TSH 1.345 12/24/2019     STUDIES/PROCEDURES reviewed today   12/2019 MPI  The study is normal.  This is a low risk study.  The left ventricular ejection fraction is normal (52%).  There is no evidence for ischemia   09/2019 Ultrasound mesenteric Summary:  Mesenteric:  Normal Celiac artery , Hepatic artery and Splenic artery findings. 70 to  99%  stenosis in the superior mesenteric artery.  SMA Velocities compared to prior study on 06/16/2019 appear to be  decreased.   Echo 06/2019 1. Left ventricular ejection fraction, by visual estimation, is 55 to  60%. The left ventricle has normal function. There is borderline left  ventricular hypertrophy.  2. Elevated left atrial pressure.  3. Left ventricular diastolic parameters are consistent with Grade II  diastolic dysfunction (pseudonormalization).  4. The left ventricle has no  regional wall motion abnormalities.  5. Global right ventricle has normal systolic function.The right  ventricular size is normal. No increase in right ventricular wall  thickness.  6. Left atrial size was normal.  7. Right atrial size was normal.  8. Mild aortic valve annular calcification.  9. The mitral valve is degenerative. Mild to moderate mitral valve  regurgitation. No evidence of mitral stenosis.  10. The tricuspid valve is normal in structure. Tricuspid valve  regurgitation is mild.   11. The aortic valve is tricuspid. Aortic valve regurgitation is mild.  Mild to moderate aortic valve sclerosis/calcification without any evidence  of aortic stenosis.  12. The pulmonic valve was normal in structure. Pulmonic valve  regurgitation is mild.  13. TR signal is inadequate for assessing pulmonary artery systolic  pressure.  14. The inferior vena cava is normal in size with greater than 50%  respiratory variability, suggesting right atrial pressure of 3 mmHg.   Cardiac monitor 05/2019 Normal sinus rhythm avg HR of 56 bpm. 1 run of Ventricular Tachycardia occurred lasting 9 beats with a max rate of 143 bpm (avg 118 bpm). 68 Supraventricular Tachycardia runs occurred, the run with the fastest interval lasting 6 beats with a max rate of 187 bpm, the longest lasting 27.9 secs with an avg rate of 103 bpm.  Isolated SVEs were occasional (1.8%, 19772), SVE Couplets were rare (<1.0%, 117), and SVE Triplets were rare (<1.0%, 10). Isolated VEs were rare (<1.0%, 1836), VE Couplets were rare (<1.0%, 3), and VE Triplets were rare (<1.0%, 1). Ventricular Trigeminy was present. No patient triggered events noted.  Ultrasound renal artery bilateral 03/26/2019 Summary:  Renal:  Right: Normal size right kidney. Abnormal right Resistive Index.     Normal cortical thickness of right kidney. No evidence of     right renal artery stenosis. RRV flow present.  Left: LRV flow present. No evidence of left renal artery stenosis.     Normal size of left kidney. Abnormal left Resisitve Index.     Normal cortical thickness of the left kidney.  Mesenteric:  70 to 99% stenosis in the celiac artery and superior mesenteric artery.    Assessment & Plan    Poorly controlled HTN, goal BP 130/80 or lower --After replacement of home blood pressure cuff, SBP ranges from 130s to 190s with diastolic 50s to 100s.  Unfortunately, there is significant medication confusion and many medication  changes since her last visit.  Recommend she continue to hold her hydralazine, as 3 times daily dosing is suboptimal for this patient.  We will restart chlorthalidone at reduced 12.5 mg daily, given exam today is consistent with volume overload.  If recurrent low sodium on labs, transition to alternative diuretic.  Continue taking clonidine for now with goal to ultimately wean her off of this medication.  Continue carvedilol as directed.  Will stop from losartan and instead start valsartan 160 mg.  Repeat BMET in 1 to 2 weeks.  Reviewed recommendation for all fluids under 2 L/day and salt under 2 g/day.  Reviewed recommendations for BP measurements.  Reassess at RTC.  Of note, we rechecked her BP prior to her leaving the office with SBP significantly decreased from that of presentation.  CAD s/p CABG 2001/EKG changes --No reported anginal symptoms with exertion or at rest.  Previous negative Myoview and echo with normal EF.  We will update those studies, given the amount of time that has passed since they were last assessed.  EKG changes noted today and  immediately reviewed with DOD/ Dr. Mariah MillingGollan.  Case discussed while reviewing EKG.  Given she denies any current symptoms consistent with angina, recommendation was for repeat EKG at RTC once BP and volume status better controlled.  Continue beta-blocker, ARB, and statin.  No ASA in the setting of Eliquis.  Present to the emergency department if any concerning symptoms between visits.  Diastolic dysfunction --She denies any worsening dyspnea and reports ongoing lower extremity edema.  She has continued to take her fluid pill on review of BP log documentation, as she continues to periodically write it under some of her pressures.  This was reportedly stopped by her PCP.  It is unclear if she is taking this pill or possibly it is confused for another.  On exam, she is volume up.  Will restart chlorthalidone 12.5 mg with recommendation to transition to an alternative  diuretic if repeat labs show a low sodium.  Continue BP support.  Will update echo as above.  Valvular heart disease --Update echo as above.  Paroxysmal atrial fibrillation Sinus bradycardia --Maintaining NSR/sinus bradycardia on EKG.  Given her elevated pressure, and as she denies any dizziness at this time, we will keep her current dose of carvedilol.  Recommend monitor heart rate and blood pressure closely.  Continue Eliquis.  Of note, on prior notes/documentation in the office, it is stated that she is not currently taking this medication.  Today, she reports she is taking it.  HLD, LDL goal below 70 --Continue statin.  Mesenteric ischemia s/p SMA stenting --Reviewed signs and symptoms of worsening mesenteric ischemia.  She denies any abdominal pain or trouble with eating.  Continue to monitor.  PAD/carotid artery disease --Continue follow-up as per vein and vascular surgery.  Recommend heart rate, BP, glycemic, and cholesterol control.  History of syncope --Denies any recent syncopal episodes.  Update echo as above.  Abnormal EKG, left bundle branch block (not new) --No anginal symptoms.  EKG shows ST/T changes throughout the lateral and inferior leads, as well as repolarization changes.  Reviewed with DOD same day.  Recommendation was for repeat EKG at RTC and once BP and volume status controlled, given no anginal symptoms.  Will update MPI/echo.  Disposition: RTC 1 week.  Labs: BMET Studies: MPI/echocardiogram Medication changes: Stop losartan.  Start valsartan 160 mg daily.  Restart chlorthalidone at reduced dose 12.5 mg daily.  Continue to hold hydralazine.  Continue current doses of carvedilol and clonidine.  They will monitor her blood pressure closely at home and call the office if consistently elevated 130/80 or above.  Daily weights and heart rate also recommended.   Will need repeat EKG at next office visit.   Lennon AlstromJacquelyn D Jefferie Holston, PA-C 10/22/2020

## 2020-10-29 ENCOUNTER — Encounter: Payer: Self-pay | Admitting: Family

## 2020-10-29 ENCOUNTER — Ambulatory Visit (INDEPENDENT_AMBULATORY_CARE_PROVIDER_SITE_OTHER): Payer: Medicare Other | Admitting: Family

## 2020-10-29 ENCOUNTER — Other Ambulatory Visit: Payer: Self-pay

## 2020-10-29 VITALS — BP 160/60 | HR 56 | Ht 61.0 in | Wt 147.0 lb

## 2020-10-29 DIAGNOSIS — Z7901 Long term (current) use of anticoagulants: Secondary | ICD-10-CM | POA: Diagnosis not present

## 2020-10-29 DIAGNOSIS — I480 Paroxysmal atrial fibrillation: Secondary | ICD-10-CM

## 2020-10-29 DIAGNOSIS — I10 Essential (primary) hypertension: Secondary | ICD-10-CM | POA: Diagnosis not present

## 2020-10-29 DIAGNOSIS — I5189 Other ill-defined heart diseases: Secondary | ICD-10-CM

## 2020-10-29 MED ORDER — VALSARTAN 320 MG PO TABS: 320.0000 mg | ORAL_TABLET | Freq: Every day | ORAL | 5 refills | Status: DC

## 2020-10-29 NOTE — Patient Instructions (Addendum)
Medication Instructions:  Your physician has recommended you make the following change in your medication:   CHANGE Valsartan to 320mg  daily  *If you need a refill on your cardiac medications before your next appointment, please call your pharmacy*   Lab Work: Your physician recommends lab work today: BMP, BNP, TSH  If you have labs (blood work) drawn today and your tests are completely normal, you will receive your results only by: MyChart Message (if you have MyChart) OR . A paper copy in the mail If you have any lab test that is abnormal or we need to change your treatment, we will call you to review the results.   Testing/Procedures: Your EKG today looked improved compared to previous.   Follow-Up: At [facility], you and your health needs are our priority.  As part of our continuing mission to provide you with exceptional heart care, we have created designated Provider Care Teams.  These Care Teams include your primary Cardiologist (physician) and Advanced Practice Providers (APPs -  Physician Assistants and Nurse Practitioners) who all work together to provide you with the care you need, when you need it.  We recommend signing up for the patient portal called "MyChart".  Sign up information is provided on this After Visit Summary.  MyChart is used to connect with patients for Virtual Visits (Telemedicine).  Patients are able to view lab/test results, encounter notes, upcoming appointments, etc.  Non-urgent messages can be sent to your provider as well.   To learn more about what you can do with MyChart, go to [facility].    Your next appointment:    11/22/20 with Dr. 01/22/21  Other Instructions  To prevent or reduce lower extremity swelling: . Eat a low salt diet. Salt makes the body hold onto extra fluid which causes swelling. . Sit with legs elevated. For example, in the recliner or on an ottoman.  . Wear knee-high compression stockings during the daytime.  Ones labeled 15-20 mmHg provide good compression.  Heart Healthy Diet Recommendations: A low-salt diet is recommended. Meats should be grilled, baked, or boiled. Avoid fried foods. Focus on lean protein sources like fish or chicken with vegetables and fruits. The American Heart Association is a Mariah Milling!  American Heart Association Diet and Lifeystyle Recommendations   Exercise recommendations: The American Heart Association recommends 150 minutes of moderate intensity exercise weekly. Try 30 minutes of moderate intensity exercise 4-5 times per week. This could include walking, jogging, or swimming.

## 2020-10-29 NOTE — Progress Notes (Signed)
Office Visit    Patient Name: Kayla [name] Date of Encounter: 10/29/2020  PCP:  Lynnea Ferrier, MD   [facility]  Cardiologist:  Julien Nordmann, MD  Advanced Practice Provider:  No care team member to display Electrophysiologist:  None    Chief Complaint    Kayla [name] is a 87 y.o. female with a hx of CAD s/p CABG 2001 ([facility]), LBBB, HTN, HLD, diastolic dysfunction, mild to moderate vavular heart disease, carotid artery disease, superior mesenteric artery stenosis/mesenteric ischemic, SVT, PVC, PAC, PAF presents today for follow-up of hypertension    Past Medical History    Past Medical History:  Diagnosis Date  . Arthritis   . Bundle branch block, left    Chronic  . CAD (coronary artery disease)    a. 2001 s/p CABG @ [facility]; b. 2009 Neg MV; c. 12/2019 MV: EF 52%, no ischemia.  . Carotid arterial disease (HCC)    a. 10/2019 Carotid U/S: RICA 40-59, LICA 1-39. F/u 2 yrs.  . Cholecystitis    from record  . Diastolic dysfunction    a. 06/2019 Echo: EF 55-60%, Gr2 DD.  [name] Emphysema lung (HCC)   . GERD (gastroesophageal reflux disease)   . Heart murmur   . HOH (hard of hearing)   . Hyperlipidemia   . Hypertension   . Hypothyroidism   . Orthostatic headache    Mild  . PAF (paroxysmal atrial fibrillation) (HCC)    a. Dx 10/2019 - converted spontaneously to sinus ED; b. CHA2DS2VASc = 5-->Eliquis.  . Pain    HIP AND LEG PAIN USES  CANE  . 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 03/2019 renal duplex w/ incidental finding of celia and SMA stenosis; b. 09/2019 Mesenteric Duplex: Nl Celiac, Hepatic, and Splenic arteries. 70-99% SMA stenosis-->Seen by vasc surgery; c. 01/2020 SMA 75-80%(7x26 balloon expandable stent). Celiac 80-90%.  . Valvular heart disease    a. 06/2019 Echo: EF 55-60%, Gr2 DD. No rwma. Nl RV fxn. Mild-mod MR. Mild TR/AI/PR. Mild to mod Ao sclerosis w/o stenosis.  . Vertigo    Past Surgical History:  Procedure Laterality Date   . APPENDECTOMY  1957  . BREAST BIOPSY Left 2011   neg, stereo done by Dr. Evette Cristal  . BREAST BIOPSY    . CARDIAC CATHETERIZATION  2000   [facility]  . CATARACT EXTRACTION Right    right 2004, left 2018   . CATARACT EXTRACTION W/PHACO Left 04/12/2017   Procedure: CATARACT EXTRACTION PHACO AND INTRAOCULAR LENS PLACEMENT (IOC);  Surgeon: Lockie Mola, MD;  Location: [facility] ORS;  Service: Ophthalmology;  Laterality: Left;  [place] 01:09.0AP% 23.7CDE 16.35FLUID PACK LOT # [id number]  . CHOLECYSTECTOMY N/A 04/25/2016   Procedure: LAPAROSCOPIC CHOLECYSTECTOMY WITH INTRAOPERATIVE CHOLANGIOGRAM;  Surgeon: Kieth Brightly, MD;  Location: [facility] ORS;  Service: General;  Laterality: N/A;  . CORONARY ANGIOPLASTY     STENT  . CORONARY ARTERY BYPASS GRAFT  2001  . KNEE SURGERY    . thyroid  1970  . UPPER GI ENDOSCOPY  2014  . VISCERAL ANGIOGRAPHY N/A 01/15/2020   Procedure: VISCERAL ANGIOGRAPHY;  Surgeon: Annice Needy, MD;  Location: [facility] INVASIVE CV LAB;  Service: Cardiovascular;  Laterality: N/A;    Allergies  Allergies  Allergen Reactions  . Bee Pollen     Other reaction(s): Other (See Comments)  . Boniva [Ibandronic Acid]     Jaw pain  . Pollen Extract   . Motrin [Ibuprofen] Rash  History of Present Illness    Kayla [name] is a 87 y.o. female with a hx of  CAD s/p CABG 2001 ([facility]), LBBB, HTN, HLD, diastolic dysfunction, mild to moderate vavular heart disease, carotid artery disease, superior mesenteric artery stenosis/mesenteric ischemic, SVT, PVC, PAC, PAF. She was last seen 10/22/2020 .  Previous echocardiogram 06/2019 normal LVEF with grade 2 diastolic dysfunction, mild to moderate valvular disease.  Diagnosed paroxysmal atrial fibrillation April 2021 and placed on Eliquis.  Seen in June 2021 with reported syncopal episode as well as chest pain.  She was evaluated in the ED same day and not interested in repeat event monitoring at follow-up.  She had MPI 12/2019 which is a low risk study.   She underwent SMA stenting 01/2020 which improved previous abdominal pain, nausea. She had follow up mesenteric and LE arterial scans 05/2020 detailed below.   When seen November 2021 she was referred to vascular due to lower extremity edema.  She declined to wear compression stockings.  She is recommended to hold amlodipine.  Her clonidine was increased to 0.2 mg twice a day.  Her primary care provider subsequently discontinued chlorthalidone due to hyponatremia and started hydralazine 50 mg 3 times per day.    She was seen in the ED 10/21/2020 due to elevated blood pressure.  On medication reconciliation it was suspected she was taking hydralazine as needed rather than 3 times per day.  Seen in cardiology follow-up 10/22/2020.  Her systolic blood pressure was noted to be running 130s to 190s with heart rate in the 50s to 90s.  Hydralazine was discontinued due to difficulty of 3 times daily dosing.  Chlorthalidone 12.5 mg was reinitiated.  Her losartan was transition to valsartan 160 mg daily.  She presents today for follow-up with her daughter Kayla [name].  Kayla [name] makes her a pillbox each week for her to utilize for medications.  She reported taking her morning medications around 730 and her evening medications around 5 PM.  She brings a blood pressure log which is detailed below.  Blood pressure readings are taken 2 hours after her medication and her blood pressure cuff at home routinely reads 10 points higher than what we get in office with manual cuff to her blood pressure at home by that calculation has been ranging systolic 159-184.  Reports no chest pain, pressure, tightness.  No shortness of breath at rest and stable mild dyspnea on exertion.  She does endorse some fatigue and tells me she has been sleepy, we discussed that her Clonidine could be contributory.  BP log since last clinic visit: Date Time BP HR   4/9 1200 184/67 53  4/10 1503 194/79 59  4/11 1220 172/63 48  4/12 1405 186/78 58  4/13 1100  169/64 51  4/14 1000 165/68 64  4/15  148/59 50   EKGs/Labs/Other Studies Reviewed:   The following studies were reviewed today:   12/2019 MPI  The study is normal.  This is a low risk study.  The left ventricular ejection fraction is normal (52%).  There is no evidence for ischemia   09/2019 Ultrasound mesenteric Summary:  Mesenteric:  Normal Celiac artery , Hepatic artery and Splenic artery findings. 70 to  99%  stenosis in the superior mesenteric artery.  SMA Velocities compared to prior study on 06/16/2019 appear to be  decreased.    Echo 06/2019  1. Left ventricular ejection fraction, by visual estimation, is 55 to  60%. The left ventricle has normal function. There  is borderline left  ventricular hypertrophy.   2. Elevated left atrial pressure.   3. Left ventricular diastolic parameters are consistent with Grade II  diastolic dysfunction (pseudonormalization).   4. The left ventricle has no regional wall motion abnormalities.   5. Global right ventricle has normal systolic function.The right  ventricular size is normal. No increase in right ventricular wall  thickness.   6. Left atrial size was normal.   7. Right atrial size was normal.   8. Mild aortic valve annular calcification.   9. The mitral valve is degenerative. Mild to moderate mitral valve  regurgitation. No evidence of mitral stenosis.  10. The tricuspid valve is normal in structure. Tricuspid valve  regurgitation is mild.  11. The aortic valve is tricuspid. Aortic valve regurgitation is mild.  Mild to moderate aortic valve sclerosis/calcification without any evidence  of aortic stenosis.  12. The pulmonic valve was normal in structure. Pulmonic valve  regurgitation is mild.  13. TR signal is inadequate for assessing pulmonary artery systolic  pressure.  14. The inferior vena cava is normal in size with greater than 50%  respiratory variability, suggesting right atrial pressure of 3 mmHg.     Cardiac monitor 05/2019 Normal sinus rhythm avg HR of 56 bpm. 1 run of Ventricular Tachycardia occurred lasting 9 beats with a max rate of 143 bpm (avg 118 bpm). 68 Supraventricular Tachycardia runs occurred, the run with the fastest interval lasting 6 beats with a max rate of 187 bpm, the longest lasting 27.9 secs with an avg rate of 103 bpm.  Isolated SVEs were occasional (1.8%, 19772), SVE Couplets were rare (<1.0%, 117), and SVE Triplets were rare (<1.0%, 10). Isolated VEs were rare (<1.0%, 1836), VE Couplets were rare (<1.0%, 3), and VE Triplets were rare (<1.0%, 1). Ventricular Trigeminy was present. No patient triggered events noted.   Ultrasound renal artery bilateral 03/26/2019 Summary:  Renal:  Right: Normal size right kidney. Abnormal right Resistive Index.         Normal cortical thickness of right kidney. No evidence of         right renal artery stenosis. RRV flow present.  Left:  LRV flow present. No evidence of left renal artery stenosis.         Normal size of left kidney. Abnormal left Resisitve Index.         Normal cortical thickness of the left kidney.  Mesenteric:  70 to 99% stenosis in the celiac artery and superior mesenteric artery.   EKG:  EKG is  ordered today.  The ekg ordered today demonstrates sinus bradycardia 51 bpm with nonspecific interventricular block and stable T wave inversion in inferior lateral leads as well as aVR.  No acute ST/T wave changes.  Recent Labs: 12/24/2019: TSH 1.345 10/21/2020: B Natriuretic Peptide 730.8; BUN 14; Creatinine, Ser 0.94; Hemoglobin 13.1; Platelets 172; Potassium 4.2; Sodium 136  Recent Lipid Panel    Component Value Date/Time   CHOL 114 07/29/2018 1109   TRIG 68.0 07/29/2018 1109   HDL 54.60 07/29/2018 1109   CHOLHDL 2 07/29/2018 1109   VLDL 13.6 07/29/2018 1109   LDLCALC 45 07/29/2018 1109    Home Medications   Current Meds  Medication Sig  . carvedilol (COREG) 12.5 MG tablet TAKE 1 TABLET BY MOUTH  TWICE  DAILY  . chlorthalidone (HYGROTON) 25 MG tablet Take 0.5 tablets (12.5 mg total) by mouth daily.  . cloNIDine (CATAPRES) 0.2 MG tablet Take 1 tablet (0.2 mg total) by mouth  2 (two) times daily.  . Cyanocobalamin (VITAMIN B 12 PO) Take 3,000 Units by mouth daily.  [name] ELIQUIS 5 MG TABS tablet TAKE 1 TABLET BY MOUTH  TWICE DAILY  . famotidine (PEPCID) 20 MG tablet TAKE 1 TABLET BY MOUTH  TWICE DAILY  . levothyroxine (SYNTHROID) 112 MCG tablet Take 112 mcg by mouth every morning.  . nitroGLYCERIN (NITROSTAT) 0.4 MG SL tablet Place 1 tablet (0.4 mg total) under the tongue every 5 (five) minutes as needed for chest pain.  . pravastatin (PRAVACHOL) 20 MG tablet TAKE 1 TABLET BY MOUTH AT  BEDTIME  . valsartan (DIOVAN) 160 MG tablet Take 1 tablet (160 mg total) by mouth daily.     Review of Systems  All other systems reviewed and are otherwise negative except as noted above.  Physical Exam    VS:  BP (!) 160/60 (BP Location: Right Arm, Patient Position: Sitting, Cuff Size: Normal)   Pulse (!) 56   Ht 5\' 1"  (1.549 m)   Wt 147 lb (66.7 kg)   SpO2 97%   BMI 27.78 kg/m  , BMI Body mass index is 27.78 kg/m.  Wt Readings from Last 3 Encounters:  10/29/20 147 lb (66.7 kg)  10/22/20 151 lb (68.5 kg)  10/21/20 170 lb (77.1 kg)    GEN: Well nourished, well developed, in no acute distress. HEENT: normal. Neck: Supple, no JVD, carotid bruits, or masses. Cardiac: Bradycardic, RRR, no murmurs, rubs, or gallops. No clubbing, cyanosis, edema.  Radials/PT 2+ and equal bilaterally.  Respiratory:  Respirations regular and unlabored, clear to auscultation bilaterally. GI: Soft, nontender, nondistended. MS: No deformity or atrophy. Skin: Warm and dry, no rash. Neuro:  Strength and sensation are intact. Psych: Normal affect.  Assessment & Plan    1. HTN -BP still uncontrolled.  Increase valsartan to 320 mg daily. BMP today. Continue chlorthalidone 12.5 mg daily, clonidine 0.2 mg twice daily, Coreg 12.5  mg twice daily.  Ideally we will be able to reduce or discontinue clonidine as anticipate it is causing much of her bradycardia and fatigue. 2. Fatigue -consider etiology deconditioning versus bradycardia.  Is been to reduce clonidine due to markedly elevated blood pressure.  Will initially increase valsartan, as above and consider reduced dose of clonidine at follow-up.  CBC to rule out anemia. 3. CAD -no anginal symptoms.  Medication for ischemic evaluation.  Continue GDMT including beta-blocker, ARB, statin.  No aspirin secondary to chronic anticoagulation. 4. Diastolic dysfunction / Valvular heart disease -upcoming echocardiogram for monitoring.  NYHA II with dyspnea on exertion.  Volume status improved since addition of chlorthalidone 12.5 mg daily. BNP today. 5. PAF /chronic anticoagulation -denies bleeding complications.  CBC for monitoring.  Maintaining sinus bradycardia by EKG today.  Continue present dose of carvedilol.  Noted to be bradycardic.  Consider reduced dose of clonidine and follow-up. 6. HLD, LDL goal <70 -continue present statin. 7. Mesenteric ischemia s/p SMA stenting -continue to follow with vascular.  Disposition: Follow up 11/22/20 as scheduled with Dr. 01/22/21   Signed, Mariah Milling, NP 10/29/2020, 11:22 AM [facility]

## 2020-10-30 LAB — BASIC METABOLIC PANEL
BUN/Creatinine Ratio: 17 (ref 12–28)
BUN: 16 mg/dL (ref 10–36)
CO2: 20 mmol/L (ref 20–29)
Calcium: 8.8 mg/dL (ref 8.7–10.3)
Chloride: 91 mmol/L — ABNORMAL LOW (ref 96–106)
Creatinine, Ser: 0.93 mg/dL (ref 0.57–1.00)
Glucose: 141 mg/dL — ABNORMAL HIGH (ref 65–99)
Potassium: 4.3 mmol/L (ref 3.5–5.2)
Sodium: 129 mmol/L — ABNORMAL LOW (ref 134–144)
eGFR: 58 mL/min/{1.73_m2} — ABNORMAL LOW (ref >59)

## 2020-10-30 LAB — TSH: TSH: 0.403 u[IU]/mL — ABNORMAL LOW (ref 0.450–4.500)

## 2020-10-30 LAB — BRAIN NATRIURETIC PEPTIDE: BNP: 102.7 pg/mL — ABNORMAL HIGH (ref 0.0–100.0)

## 2020-11-02 ENCOUNTER — Telehealth: Payer: Self-pay | Admitting: *Deleted

## 2020-11-02 MED ORDER — CHLORTHALIDONE 25 MG PO TABS: 12.5000 mg | ORAL_TABLET | ORAL | Status: DC

## 2020-11-02 NOTE — Telephone Encounter (Signed)
The patient has been notified of the result and verbalized understanding.  All questions (if any) were answered. Patient aware to take chlorthalidone 12.5 mg by mouth every other day. She wrote down the information and read it back to me.  Med list updated.

## 2020-11-02 NOTE — Telephone Encounter (Signed)
-----   Message from Alver Sorrow, NP sent at 11/01/2020  7:47 AM EDT ----- Stable kidney function. Normal potassium. Sodium is now low, recommend strict adherence to <2L fluid each day and to reduce Chlorthalidone to every other day. TSH mildly low, recommend discussing with PCP - I have forwarded result to him. BNP with no significant volume overload.

## 2020-11-09 ENCOUNTER — Other Ambulatory Visit: Payer: Self-pay

## 2020-11-09 ENCOUNTER — Ambulatory Visit (INDEPENDENT_AMBULATORY_CARE_PROVIDER_SITE_OTHER): Payer: Medicare Other

## 2020-11-09 DIAGNOSIS — I6523 Occlusion and stenosis of bilateral carotid arteries: Secondary | ICD-10-CM | POA: Diagnosis not present

## 2020-11-16 ENCOUNTER — Telehealth: Payer: Self-pay

## 2020-11-16 NOTE — Telephone Encounter (Signed)
Able to reach pt regarding her recent carotid US, Dr. Mariah Milling had a chance to review her results and advised   "Carotid ultrasound  Minimal on the left  Mild to moderate on the right estimated 40 to 59%  Recheck 1 years"  Kayla [name] is please with the results and thankful for the phone, reports BP slightly elevated this morning 154/92 & 155/81, she has since taken her morning BP meds and will take her afternoon BP meds as well. Advised to take her BP 2 hrs after medications, keep journal of readings and bring with her to her appt next week for Dr. Mariah Milling to review. Kayla [name] verbalized understanding,  otherwise all questions or concerns were address and no additional concerns at this time. Agreeable to plan, will call back for anything further.    Echo 5/5 Dr. Mariah Milling 5/13

## 2020-11-18 ENCOUNTER — Ambulatory Visit (INDEPENDENT_AMBULATORY_CARE_PROVIDER_SITE_OTHER): Payer: Medicare Other

## 2020-11-18 ENCOUNTER — Other Ambulatory Visit: Payer: Self-pay

## 2020-11-18 DIAGNOSIS — I5033 Acute on chronic diastolic (congestive) heart failure: Secondary | ICD-10-CM | POA: Diagnosis not present

## 2020-11-18 LAB — ECHOCARDIOGRAM COMPLETE
AR max vel: 1.65 cm2
AV Area VTI: 1.54 cm2
AV Area mean vel: 1.57 cm2
AV Mean grad: 7 mmHg
AV Peak grad: 12.4 mmHg
Ao pk vel: 1.76 m/s
Area-P 1/2: 2.94 cm2
Calc EF: 47.8 %
P 1/2 time: 594 msec
S' Lateral: 3.3 cm
Single Plane A2C EF: 47.7 %
Single Plane A4C EF: 48.9 %

## 2020-11-22 ENCOUNTER — Ambulatory Visit: Payer: Medicare Other | Admitting: Cardiovascular Disease

## 2020-11-23 ENCOUNTER — Ambulatory Visit (INDEPENDENT_AMBULATORY_CARE_PROVIDER_SITE_OTHER): Payer: Medicare Other | Admitting: Nurse Practitioner

## 2020-11-23 ENCOUNTER — Ambulatory Visit (INDEPENDENT_AMBULATORY_CARE_PROVIDER_SITE_OTHER): Payer: Medicare Other

## 2020-11-23 ENCOUNTER — Other Ambulatory Visit: Payer: Self-pay

## 2020-11-23 ENCOUNTER — Encounter (INDEPENDENT_AMBULATORY_CARE_PROVIDER_SITE_OTHER): Payer: Self-pay | Admitting: Nurse Practitioner

## 2020-11-23 VITALS — BP 215/90 | HR 67 | Ht 61.0 in | Wt 144.0 lb

## 2020-11-23 DIAGNOSIS — K559 Vascular disorder of intestine, unspecified: Secondary | ICD-10-CM | POA: Diagnosis not present

## 2020-11-23 DIAGNOSIS — I10 Essential (primary) hypertension: Secondary | ICD-10-CM | POA: Diagnosis not present

## 2020-11-23 DIAGNOSIS — E785 Hyperlipidemia, unspecified: Secondary | ICD-10-CM

## 2020-11-23 DIAGNOSIS — I6523 Occlusion and stenosis of bilateral carotid arteries: Secondary | ICD-10-CM | POA: Diagnosis not present

## 2020-11-23 NOTE — Progress Notes (Signed)
Subjective:    Patient ID: Kayla [name], female    DOB: 05/08/1927, 87 y.o.   MRN: [id number] Chief Complaint  Patient presents with  . 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  The patient most recently had intervention on 01/15/2020 with a stent placement to the SMA.  The patient denies abdominal pain or postprandial symptoms.  The patient denies weight loss as well as nausea.  The patient does not substantiate food fear, particular foods do not seem to aggravate or alleviate the symptoms.  The patient denies bloody bowel movements or diarrhea.  She also has a history of celiac artery stenosis.  The patient denies amaurosis fugax or recent TIA symptoms. There are no recent neurological changes noted. The patient denies claudication symptoms or rest pain symptoms. The patient denies history of DVT, PE or superficial thrombophlebitis. The patient denies recent episodes of angina  . Today noninvasive studies show elevated velocities in the region as well as proximal portion of the celiac artery.  There is a 70 to 99% stenosis.  The patient has a normal superior mesenteric artery velocities indicating a patent stent.  The largest aortic diameter is 1.7 cm.   Review of Systems  Skin: Positive for wound.  Neurological: Positive for weakness.       Objective:   Physical Exam Vitals reviewed.  HENT:     Head: Normocephalic.  Cardiovascular:     Rate and Rhythm: Normal rate.     Pulses: Normal pulses.  Pulmonary:     Effort: Pulmonary effort is normal.  Skin:    General: Skin is warm and dry.  Neurological:     Mental Status: She is alert and oriented to person, place, and time.  Psychiatric:        Mood and Affect: Mood normal.        Behavior: Behavior normal.        Thought Content: Thought content normal.         Judgment: Judgment normal.     BP (!) 215/90   Pulse 67   Ht 5\' 1"  (1.549 m)   Wt 144 lb (65.3 kg)   BMI 27.21 kg/m   Past Medical History:  Diagnosis Date  . Arthritis   . Bundle branch block, left    Chronic  . CAD (coronary artery disease)    a. 2001 s/p CABG @ [facility]; b. 2009 Neg MV; c. 12/2019 MV: EF 52%, no ischemia.  . Carotid arterial disease (HCC)    a. 10/2019 Carotid U/S: RICA 40-59, LICA 1-39. F/u 2 yrs.  . Cholecystitis    from record  . Diastolic dysfunction    a. 06/2019 Echo: EF 55-60%, Gr2 DD.  07/2019 Emphysema lung (HCC)   . GERD (gastroesophageal reflux disease)   . Heart murmur   . HOH (hard of hearing)   . Hyperlipidemia   . Hypertension   . Hypothyroidism   . Orthostatic headache    Mild  . PAF (paroxysmal atrial fibrillation) (HCC)    a. Dx 10/2019 - converted spontaneously to sinus ED; b. CHA2DS2VASc = 5-->Eliquis.  . Pain    HIP AND LEG PAIN USES  CANE  . 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 03/2019 renal duplex w/ incidental finding of celia and SMA stenosis; b. 09/2019 Mesenteric  Duplex: Nl Celiac, Hepatic, and Splenic arteries. 70-99% SMA stenosis-->Seen by vasc surgery; c. 01/2020 SMA 75-80%(7x26 balloon expandable stent). Celiac 80-90%.  . Valvular heart disease    a. 06/2019 Echo: EF 55-60%, Gr2 DD. No rwma. Nl RV fxn. Mild-mod MR. Mild TR/AI/PR. Mild to mod Ao sclerosis w/o stenosis.  . Vertigo     Social History   Socioeconomic History  . Marital status: Widowed    Spouse name: Not on file  . Number of children: Not on file  . Years of education: Not on file  . Highest education level: Not on file  Occupational History  . Occupation: Part time  Tobacco Use  . Smoking status: Never Smoker  . Smokeless tobacco: Never Used  Vaping Use  . Vaping Use: Never used  Substance and Sexual Activity  . Alcohol use: No  . Drug use: No  . Sexual activity: Never  Other Topics Concern  . Not on file  Social History Narrative   Widow    Lives with one one her daughters      6 children      Lives in [place]            Social Determinants of Health   Financial Resource Strain: Not on file  Food Insecurity: Not on file  Transportation Needs: Not on file  Physical Activity: Not on file  Stress: Not on file  Social Connections: Not on file  Intimate Partner Violence: Not on file    Past Surgical History:  Procedure Laterality Date  . APPENDECTOMY  1957  . BREAST BIOPSY Left 2011   neg, stereo done by Dr. Evette Cristal  . BREAST BIOPSY    . CARDIAC CATHETERIZATION  2000   [facility]  . CATARACT EXTRACTION Right    right 2004, left 2018   . CATARACT EXTRACTION W/PHACO Left 04/12/2017   Procedure: CATARACT EXTRACTION PHACO AND INTRAOCULAR LENS PLACEMENT (IOC);  Surgeon: Lockie Mola, MD;  Location: [facility] ORS;  Service: Ophthalmology;  Laterality: Left;  [place] 01:09.0AP% 23.7CDE 16.35FLUID PACK LOT # [id number]  . CHOLECYSTECTOMY N/A 04/25/2016   Procedure: LAPAROSCOPIC CHOLECYSTECTOMY WITH INTRAOPERATIVE CHOLANGIOGRAM;  Surgeon: Kieth Brightly, MD;  Location: [facility] ORS;  Service: General;  Laterality: N/A;  . CORONARY ANGIOPLASTY     STENT  . CORONARY ARTERY BYPASS GRAFT  2001  . KNEE SURGERY    . thyroid  1970  . UPPER GI ENDOSCOPY  2014  . VISCERAL ANGIOGRAPHY N/A 01/15/2020   Procedure: VISCERAL ANGIOGRAPHY;  Surgeon: Annice Needy, MD;  Location: [facility] INVASIVE [facility] LAB;  Service: Cardiovascular;  Laterality: N/A;    Family History  Problem Relation Age of Onset  . Heart failure Mother 76  . Heart failure Sister 30  . Heart attack Sister   . Heart attack Brother 42  . Breast cancer Neg Hx     Allergies  Allergen Reactions  . Bee Pollen     Other reaction(s): Other (See Comments)  . Boniva [Ibandronic Acid]     Jaw pain  . Pollen Extract   . Motrin [Ibuprofen] Rash    CBC Latest Ref Rng & Units 10/21/2020 12/20/2019 11/14/2019  WBC 4.0 - 10.5 K/uL 6.8 7.5 8.4  Hemoglobin 12.0 - 15.0 g/dL 62.8 31.5 17.6   Hematocrit 36.0 - 46.0 % 39.7 38.8 41.6  Platelets 150 - 400 K/uL 172 207 170      CMP     Component Value Date/Time   NA 129 (L) 10/29/2020 1154  K 4.3 10/29/2020 1154   CL 91 (L) 10/29/2020 1154   CO2 20 10/29/2020 1154   GLUCOSE 141 (H) 10/29/2020 1154   GLUCOSE 132 (H) 10/21/2020 1520   BUN 16 10/29/2020 1154   CREATININE 0.93 10/29/2020 1154   CREATININE 1.07 (H) 03/21/2019 1607   CALCIUM 8.8 10/29/2020 1154   PROT 6.8 10/21/2019 1218   PROT 6.8 10/21/2019 1218   ALBUMIN 3.9 10/21/2019 1218   ALBUMIN 3.9 10/21/2019 1218   AST 15 10/21/2019 1218   AST 15 10/21/2019 1218   ALT 12 10/21/2019 1218   ALT 11 10/21/2019 1218   ALKPHOS 42 10/21/2019 1218   ALKPHOS 42 10/21/2019 1218   BILITOT 1.3 (H) 10/21/2019 1218   BILITOT 1.1 10/21/2019 1218   GFRNONAA 57 (L) 10/21/2020 1520   GFRAA 60 (L) 01/15/2020 1035     No results found.     Assessment & Plan:   1. Mesenteric ischemia (HCC) Recommend:  The patient has evidence of chronic asymptomatic mesenteric atherosclerosis.  The patient denies lifestyle limiting changes at this point in time.  Given the lack of symptoms no intervention is warranted at this time.   No further invasive studies, angiography or surgery at this time  The patient should continue walking and begin a more formal exercise program.   The patient should continue antiplatelet therapy and aggressive treatment of the lipid abnormalities  Patient should undergo noninvasive studies as ordered. The patient will follow up with me after the studies.    2. Essential hypertension Continue antihypertensive medications as already ordered, these medications have been reviewed and there are no changes at this time.   3. Hyperlipidemia, unspecified hyperlipidemia type Continue statin as ordered and reviewed, no changes at this time   4. Bilateral carotid artery stenosis The patient had a carotid artery duplex recently at her cardiologist.   Stenosis was less than 60% bilaterally.  She will continue to follow with cardiology for monitoring.   Current Outpatient Medications on File Prior to Visit  Medication Sig Dispense Refill  . carvedilol (COREG) 12.5 MG tablet TAKE 1 TABLET BY MOUTH  TWICE DAILY 180 tablet 3  . chlorthalidone (HYGROTON) 25 MG tablet Take 0.5 tablets (12.5 mg total) by mouth every other day.    . cloNIDine (CATAPRES) 0.2 MG tablet Take 1 tablet (0.2 mg total) by mouth 2 (two) times daily. 180 tablet 3  . Cyanocobalamin (VITAMIN B 12 PO) Take 3,000 Units by mouth daily.    [name] ELIQUIS 5 MG TABS tablet TAKE 1 TABLET BY MOUTH  TWICE DAILY 180 tablet 1  . famotidine (PEPCID) 20 MG tablet TAKE 1 TABLET BY MOUTH  TWICE DAILY 180 tablet 3  . levothyroxine (SYNTHROID) 100 MCG tablet Take 100 mcg by mouth every morning.    [name] levothyroxine (SYNTHROID) 112 MCG tablet Take 112 mcg by mouth every morning.    . nitroGLYCERIN (NITROSTAT) 0.4 MG SL tablet Place 1 tablet (0.4 mg total) under the tongue every 5 (five) minutes as needed for chest pain. 25 tablet 1  . pravastatin (PRAVACHOL) 20 MG tablet TAKE 1 TABLET BY MOUTH AT  BEDTIME 90 tablet 3  . valsartan (DIOVAN) 320 MG tablet Take 1 tablet (320 mg total) by mouth daily. 30 tablet 5   No current facility-administered medications on file prior to visit.    There are no Patient Instructions on file for this visit. No follow-ups on file.   Georgiana Spinner, NP

## 2020-11-26 ENCOUNTER — Other Ambulatory Visit: Payer: Self-pay | Admitting: Cardiovascular Disease

## 2020-11-26 ENCOUNTER — Encounter: Payer: Self-pay | Admitting: Cardiovascular Disease

## 2020-11-26 ENCOUNTER — Ambulatory Visit: Payer: Medicare Other | Admitting: Cardiovascular Disease

## 2020-11-26 ENCOUNTER — Other Ambulatory Visit: Payer: Self-pay

## 2020-11-26 VITALS — BP 180/80 | HR 55 | Ht 61.0 in | Wt 147.0 lb

## 2020-11-26 DIAGNOSIS — I480 Paroxysmal atrial fibrillation: Secondary | ICD-10-CM | POA: Diagnosis not present

## 2020-11-26 DIAGNOSIS — I25718 Atherosclerosis of autologous vein coronary artery bypass graft(s) with other forms of angina pectoris: Secondary | ICD-10-CM

## 2020-11-26 MED ORDER — HYDRALAZINE HCL 50 MG PO TABS: 50.0000 mg | ORAL_TABLET | Freq: Three times a day (TID) | ORAL | 3 refills | Status: AC | PRN

## 2020-11-26 NOTE — Progress Notes (Signed)
Cardiology Office Note  Date:  11/26/2020   ID:  Kayla [name], DOB 1926/08/29, MRN [id number]  PCP:  Lynnea Ferrier, MD   Chief Complaint  Patient presents with  . 6 month follow up     Patient c/o elevated blood pressure recently. Medications reviewed by the patient verbally.     HPI:  [name] is an 87 y/o woman with h/o  CAD s/p CABG in 2001 at [facility], LBBB,  hyperlipidemia,  GERD   HTN.  Myoview in 2009 was normal.  Mild aortic valve stenosis History of PVCs Here for routine f/u Of her coronary artery disease , paroxysmal atrial fibrillation, labile hypertension  Several recent visits to our office with medication changes as detailed below 10/22/2020 STOP Losartan STOP Hydralazine (for now until further instructed by cardiology)  START Valsartan 160mg  daily DECREASE Chlorthalidone to 12.5mg  - Take HALF tablet daily  4/15:  Valsartan to 320mg  daily  Presents today in a wheelchair In follow-up today she reports labile blood pressure, often running high Periodic low pressures more notable at noon with rising numbers towards the evenings Does not know what to do as hydralazine was stopped Does run medications out of a pill bottle, does not use her pillbox Reports compliance with everything even the clonidine  Stable leg swelling, mild No recent falls but does have prior history of falls and rib fracture on the right Presents today with family  EKG personally reviewed by myself on todays visit Shows normal sinus rhythm rate 55 bpm left bundle branch block  Other past medical history reviewed Seen in the emergency room November 14, 2019 Noted to have some lightheadedness/orthostasis Was given IV fluids, improvement of her symptoms EKG documenting atrial fibrillation, new finding Recommendation made to start Eliquis 5 twice daily, hold aspirin Coupon provided  Discussed previous syncope/dizziness episode in September 2020, wonders if it could have been atrial  fibrillation Daughter reports that EMTs at the time thought she was in atrial fibrillation but it resolved by the time she went to the emergency room  Carotid 40 -50 on the right <39 on the left   unwitnessed syncope 03/2019.   recently been started on isosorbide mononitrate for elevated BP.  sitting in a bath and began to feel dizzy.   he got out of the bath and sat on the toilet.   syncopal episode falling off the toilet and striking her left shoulder.   Imdur was discontinued and she was placed back on amlodipine.    Echocardiogram   normal LV function Mild AI, TR, MR  PMH:   has a past medical history of Arthritis, Bundle branch block, left, CAD (coronary artery disease), Carotid arterial disease (HCC), Cholecystitis, Diastolic dysfunction, Emphysema lung (HCC), GERD (gastroesophageal reflux disease), Heart murmur, HOH (hard of hearing), Hyperlipidemia, Hypertension, Hypothyroidism, Orthostatic headache, PAF (paroxysmal atrial fibrillation) (HCC), Pain, Superior mesenteric artery stenosis (HCC), Valvular heart disease, and Vertigo.  PSH:    Past Surgical History:  Procedure Laterality Date  . APPENDECTOMY  1957  . BREAST BIOPSY Left 2011   neg, stereo done by Dr. 04/2019  . BREAST BIOPSY    . CARDIAC CATHETERIZATION  2000   [facility]  . CATARACT EXTRACTION Right    right 2004, left 2018   . CATARACT EXTRACTION W/PHACO Left 04/12/2017   Procedure: CATARACT EXTRACTION PHACO AND INTRAOCULAR LENS PLACEMENT (IOC);  Surgeon: 2019, MD;  Location: [facility] ORS;  Service: Ophthalmology;  Laterality: Left;  04/14/2017 01:09.0AP% 23.7CDE 16.35FLUID PACK  LOT # [id number]  . CHOLECYSTECTOMY N/A 04/25/2016   Procedure: LAPAROSCOPIC CHOLECYSTECTOMY WITH INTRAOPERATIVE CHOLANGIOGRAM;  Surgeon: Kieth Brightly, MD;  Location: [facility] ORS;  Service: General;  Laterality: N/A;  . CORONARY ANGIOPLASTY     STENT  . CORONARY ARTERY BYPASS GRAFT  2001  . KNEE SURGERY    . thyroid  1970  . UPPER GI  ENDOSCOPY  2014  . VISCERAL ANGIOGRAPHY N/A 01/15/2020   Procedure: VISCERAL ANGIOGRAPHY;  Surgeon: Annice Needy, MD;  Location: [facility] INVASIVE CV LAB;  Service: Cardiovascular;  Laterality: N/A;    Current Outpatient Medications  Medication Sig Dispense Refill  . carvedilol (COREG) 12.5 MG tablet TAKE 1 TABLET BY MOUTH  TWICE DAILY 180 tablet 3  . chlorthalidone (HYGROTON) 25 MG tablet Take 0.5 tablets (12.5 mg total) by mouth every other day.    . cloNIDine (CATAPRES) 0.2 MG tablet Take 1 tablet (0.2 mg total) by mouth 2 (two) times daily. 180 tablet 3  . Cyanocobalamin (VITAMIN B 12 PO) Take 3,000 Units by mouth daily.    [name] ELIQUIS 5 MG TABS tablet TAKE 1 TABLET BY MOUTH  TWICE DAILY 180 tablet 1  . famotidine (PEPCID) 20 MG tablet TAKE 1 TABLET BY MOUTH  TWICE DAILY 180 tablet 3  . hydrALAZINE (APRESOLINE) 50 MG tablet Take 1 tablet (50 mg total) by mouth 3 (three) times daily as needed. 270 tablet 3  . levothyroxine (SYNTHROID) 100 MCG tablet Take 100 mcg by mouth every morning.    . nitroGLYCERIN (NITROSTAT) 0.4 MG SL tablet Place 1 tablet (0.4 mg total) under the tongue every 5 (five) minutes as needed for chest pain. 25 tablet 1  . pravastatin (PRAVACHOL) 20 MG tablet TAKE 1 TABLET BY MOUTH AT  BEDTIME 90 tablet 3  . valsartan (DIOVAN) 320 MG tablet Take 1 tablet (320 mg total) by mouth daily. 30 tablet 5   No current facility-administered medications for this visit.     Allergies:   Bee pollen, Boniva [ibandronic acid], Pollen extract, and Motrin [ibuprofen]   Social History:  The patient  reports that she has never smoked. She has never used smokeless tobacco. She reports that she does not drink alcohol and does not use drugs.   Family History:   family history includes Heart attack in her sister; Heart attack (age of onset: 69) in her brother; Heart failure (age of onset: 73) in her sister; Heart failure (age of onset: 5) in her mother.    Review of Systems: Review of Systems   Constitutional: Positive for malaise/fatigue.  HENT: Negative.   Respiratory: Negative.   Cardiovascular: Negative.   Gastrointestinal: Negative.   Musculoskeletal: Negative.   Neurological: Negative.   Psychiatric/Behavioral: Negative.   All other systems reviewed and are negative.   PHYSICAL EXAM: VS:  BP (!) 180/80 (BP Location: Left Arm, Patient Position: Sitting, Cuff Size: Normal)   Pulse (!) 55   Ht 5\' 1"  (1.549 m)   Wt 147 lb (66.7 kg)   BMI 27.78 kg/m  , BMI Body mass index is 27.78 kg/m.  Constitutional:  oriented to person, place, and time. No distress.  HENT:  Head: Grossly normal Eyes:  no discharge. No scleral icterus.  Neck: No JVD, no carotid bruits  Cardiovascular: Regular rate and rhythm, no murmurs appreciated Pulmonary/Chest: Clear to auscultation bilaterally, no wheezes or rails Abdominal: Soft.  no distension.  no tenderness.  Musculoskeletal: Normal range of motion Neurological:  normal muscle tone. Coordination normal. No atrophy  Skin: Skin warm and dry Psychiatric: normal affect, pleasant   Recent Labs: 10/21/2020: Hemoglobin 13.1; Platelets 172 10/29/2020: BNP 102.7; BUN 16; Creatinine, Ser 0.93; Potassium 4.3; Sodium 129; TSH 0.403    Lipid Panel Lab Results  Component Value Date   CHOL 114 07/29/2018   HDL 54.60 07/29/2018   LDLCALC 45 07/29/2018   TRIG 68.0 07/29/2018     Wt Readings from Last 3 Encounters:  11/26/20 147 lb (66.7 kg)  11/23/20 144 lb (65.3 kg)  10/29/20 147 lb (66.7 kg)     ASSESSMENT AND PLAN:  Atherosclerosis of autologous vein coronary artery bypass graft with other forms of angina pectoris (HCC) -  Currently with no symptoms of angina. No further workup at this time. Continue current medication regimen. Cholesterol at goal  Essential hypertension  Long history of labile numbers making it very difficult to treat, Labile nature could be secondary to timing of the clonidine, recommend she try not to miss any  doses of clonidine and take this at appropriate time  will move valsartan to noon as numbers typically low around that time and elevated in the evening Add hydralazine 50 3 times daily as needed for systolic pressures over 160, recommend to check pressures in the afternoon -If labile nature persists may need to change clonidine to 0.1 mg 3 times daily rather than twice daily dosing  Paroxysmal A. Fib Maintaining normal sinus rhythm, continue carvedilol 12.5 twice daily On Eliquis 5 twice daily  Aortic valve stenosis, etiology of cardiac valve disease unspecified -  Very mild stenosis on echo May 2022  Carotid stenosis, right 40-59% stenosis on the right,  Less than 39% blockage on the left,  Cholesterol at goal  Hyperlipidemia  continue current medications, numbers at goal   Total encounter time more than 35 minutes  Greater than 50% was spent in counseling and coordination of care with the patient   Orders Placed This Encounter  Procedures  . EKG 12-Lead     Signed, Dossie Arbour, M.D., Ph.D. 11/26/2020  [facility] [place], [place] [phone]

## 2020-11-26 NOTE — Patient Instructions (Addendum)
Medication Instructions:  Please take the valsartan at noon with the Vit D/B12  Monitor blood pressure in the afternoon, If >160, repeat check, if >160 Take a hydralazine   If you need a refill on your cardiac medications before your next appointment, please call your pharmacy.    Lab work: No new labs needed   If you have labs (blood work) drawn today and your tests are completely normal, you will receive your results only by: [name] MyChart Message (if you have MyChart) OR . A paper copy in the mail If you have any lab test that is abnormal or we need to change your treatment, we will call you to review the results.   Testing/Procedures: No new testing needed   Follow-Up: At [facility], you and your health needs are our priority.  As part of our continuing mission to provide you with exceptional heart care, we have created designated Provider Care Teams.  These Care Teams include your primary Cardiologist (physician) and Advanced Practice Providers (APPs -  Physician Assistants and Nurse Practitioners) who all work together to provide you with the care you need, when you need it.  . You will need a follow up appointment in 3-6 months  . Providers on your designated Care Team:   . Nicolasa Ducking, NP . Eula Listen, PA-C . Marisue Ivan, PA-C  Any Other Special Instructions Will Be Listed Below (If Applicable).  COVID-19 Vaccine Information can be found at: [email or web address] For questions related to vaccine distribution or appointments, please email [email or web address] or call [phone].

## 2020-11-29 ENCOUNTER — Other Ambulatory Visit: Payer: Self-pay

## 2020-11-29 DIAGNOSIS — I10 Essential (primary) hypertension: Secondary | ICD-10-CM

## 2020-11-29 MED ORDER — VALSARTAN 320 MG PO TABS: 320.0000 mg | ORAL_TABLET | Freq: Every day | ORAL | 1 refills | Status: DC

## 2020-12-01 ENCOUNTER — Emergency Department: Payer: Medicare Other

## 2020-12-01 ENCOUNTER — Encounter: Payer: Self-pay | Admitting: Emergency Medicine

## 2020-12-01 ENCOUNTER — Observation Stay
Admission: EM | Admit: 2020-12-01 | Discharge: 2020-12-02 | Disposition: A | Discharge status: Home / Self Care | Payer: Medicare Other | Attending: Internal Medicine | Admitting: Internal Medicine

## 2020-12-01 ENCOUNTER — Other Ambulatory Visit: Payer: Self-pay

## 2020-12-01 DIAGNOSIS — E039 Hypothyroidism, unspecified: Secondary | ICD-10-CM | POA: Diagnosis not present

## 2020-12-01 DIAGNOSIS — E860 Dehydration: Secondary | ICD-10-CM | POA: Insufficient documentation

## 2020-12-01 DIAGNOSIS — E785 Hyperlipidemia, unspecified: Secondary | ICD-10-CM

## 2020-12-01 DIAGNOSIS — I2510 Atherosclerotic heart disease of native coronary artery without angina pectoris: Secondary | ICD-10-CM | POA: Insufficient documentation

## 2020-12-01 DIAGNOSIS — I129 Hypertensive chronic kidney disease with stage 1 through stage 4 chronic kidney disease, or unspecified chronic kidney disease: Secondary | ICD-10-CM | POA: Diagnosis not present

## 2020-12-01 DIAGNOSIS — Z951 Presence of aortocoronary bypass graft: Secondary | ICD-10-CM | POA: Insufficient documentation

## 2020-12-01 DIAGNOSIS — R55 Syncope and collapse: Principal | ICD-10-CM | POA: Diagnosis present

## 2020-12-01 DIAGNOSIS — I160 Hypertensive urgency: Secondary | ICD-10-CM | POA: Diagnosis not present

## 2020-12-01 DIAGNOSIS — R2689 Other abnormalities of gait and mobility: Secondary | ICD-10-CM | POA: Insufficient documentation

## 2020-12-01 DIAGNOSIS — N189 Chronic kidney disease, unspecified: Secondary | ICD-10-CM | POA: Insufficient documentation

## 2020-12-01 DIAGNOSIS — Z7901 Long term (current) use of anticoagulants: Secondary | ICD-10-CM | POA: Insufficient documentation

## 2020-12-01 DIAGNOSIS — R109 Unspecified abdominal pain: Secondary | ICD-10-CM | POA: Diagnosis not present

## 2020-12-01 DIAGNOSIS — R4189 Other symptoms and signs involving cognitive functions and awareness: Secondary | ICD-10-CM

## 2020-12-01 DIAGNOSIS — Z79899 Other long term (current) drug therapy: Secondary | ICD-10-CM | POA: Diagnosis not present

## 2020-12-01 DIAGNOSIS — Z20822 Contact with and (suspected) exposure to covid-19: Secondary | ICD-10-CM | POA: Insufficient documentation

## 2020-12-01 DIAGNOSIS — I480 Paroxysmal atrial fibrillation: Secondary | ICD-10-CM | POA: Insufficient documentation

## 2020-12-01 DIAGNOSIS — R112 Nausea with vomiting, unspecified: Secondary | ICD-10-CM

## 2020-12-01 LAB — URINALYSIS, COMPLETE (UACMP) WITH MICROSCOPIC
Bacteria, UA: NONE SEEN
Bilirubin Urine: NEGATIVE
Glucose, UA: NEGATIVE mg/dL
Hgb urine dipstick: NEGATIVE
Ketones, ur: NEGATIVE mg/dL
Leukocytes,Ua: NEGATIVE
Nitrite: NEGATIVE
Protein, ur: NEGATIVE mg/dL
Specific Gravity, Urine: 1.013 (ref 1.005–1.030)
pH: 6 (ref 5.0–8.0)

## 2020-12-01 LAB — RESP PANEL BY RT-PCR (FLU A&B, COVID) ARPGX2
Influenza A by PCR: NEGATIVE
Influenza B by PCR: NEGATIVE
SARS Coronavirus 2 by RT PCR: NEGATIVE

## 2020-12-01 LAB — TROPONIN I (HIGH SENSITIVITY)
Troponin I (High Sensitivity): 8 ng/L (ref <18)
Troponin I (High Sensitivity): 9 ng/L (ref <18)

## 2020-12-01 LAB — COMPREHENSIVE METABOLIC PANEL
ALT: 10 U/L (ref 0–44)
AST: 16 U/L (ref 15–41)
Albumin: 3.6 g/dL (ref 3.5–5.0)
Alkaline Phosphatase: 44 U/L (ref 38–126)
Anion gap: 12 (ref 5–15)
BUN: 25 mg/dL — ABNORMAL HIGH (ref 8–23)
CO2: 21 mmol/L — ABNORMAL LOW (ref 22–32)
Calcium: 8.7 mg/dL — ABNORMAL LOW (ref 8.9–10.3)
Chloride: 98 mmol/L (ref 98–111)
Creatinine, Ser: 1.03 mg/dL — ABNORMAL HIGH (ref 0.44–1.00)
GFR, Estimated: 51 mL/min — ABNORMAL LOW (ref >60)
Glucose, Bld: 145 mg/dL — ABNORMAL HIGH (ref 70–99)
Potassium: 3.7 mmol/L (ref 3.5–5.1)
Sodium: 131 mmol/L — ABNORMAL LOW (ref 135–145)
Total Bilirubin: 0.6 mg/dL (ref 0.3–1.2)
Total Protein: 5.9 g/dL — ABNORMAL LOW (ref 6.5–8.1)

## 2020-12-01 LAB — BLOOD GAS, VENOUS
Acid-base deficit: 0.8 mmol/L (ref 0.0–2.0)
Bicarbonate: 24.3 mmol/L (ref 20.0–28.0)
FIO2: 21
O2 Saturation: 97 %
Patient temperature: 37
pCO2, Ven: 41 mmHg — ABNORMAL LOW (ref 44.0–60.0)
pH, Ven: 7.38 (ref 7.250–7.430)
pO2, Ven: 92 mmHg — ABNORMAL HIGH (ref 32.0–45.0)

## 2020-12-01 LAB — PROCALCITONIN: Procalcitonin: <0.1 ng/mL

## 2020-12-01 LAB — CBC WITH DIFFERENTIAL/PLATELET
Abs Immature Granulocytes: 0.03 10*3/uL (ref 0.00–0.07)
Basophils Absolute: 0.1 10*3/uL (ref 0.0–0.1)
Basophils Relative: 1 %
Eosinophils Absolute: 0.3 10*3/uL (ref 0.0–0.5)
Eosinophils Relative: 5 %
HCT: 35.2 % — ABNORMAL LOW (ref 36.0–46.0)
Hemoglobin: 11.8 g/dL — ABNORMAL LOW (ref 12.0–15.0)
Immature Granulocytes: 1 %
Lymphocytes Relative: 30 %
Lymphs Abs: 1.9 10*3/uL (ref 0.7–4.0)
MCH: 30.9 pg (ref 26.0–34.0)
MCHC: 33.5 g/dL (ref 30.0–36.0)
MCV: 92.1 fL (ref 80.0–100.0)
Monocytes Absolute: 0.7 10*3/uL (ref 0.1–1.0)
Monocytes Relative: 11 %
Neutro Abs: 3.4 10*3/uL (ref 1.7–7.7)
Neutrophils Relative %: 52 %
Platelets: 176 10*3/uL (ref 150–400)
RBC: 3.82 MIL/uL — ABNORMAL LOW (ref 3.87–5.11)
RDW: 13.8 % (ref 11.5–15.5)
WBC: 6.4 10*3/uL (ref 4.0–10.5)
nRBC: 0 % (ref 0.0–0.2)

## 2020-12-01 LAB — LACTIC ACID, PLASMA
Lactic Acid, Venous: 1.2 mmol/L (ref 0.5–1.9)
Lactic Acid, Venous: 1.3 mmol/L (ref 0.5–1.9)

## 2020-12-01 LAB — MAGNESIUM: Magnesium: 1.9 mg/dL (ref 1.7–2.4)

## 2020-12-01 LAB — TSH: TSH: 1.368 u[IU]/mL (ref 0.350–4.500)

## 2020-12-01 LAB — CBG MONITORING, ED: Glucose-Capillary: 129 mg/dL — ABNORMAL HIGH (ref 70–99)

## 2020-12-01 MED ORDER — MIDAZOLAM HCL 2 MG/2ML IJ SOLN
1.0000 mg | Freq: Once | INTRAMUSCULAR | Status: AC
  Administered 2020-12-01: 1 mg via INTRAVENOUS
  Filled 2020-12-01: qty 2

## 2020-12-01 MED ORDER — PANTOPRAZOLE SODIUM 40 MG IV SOLR
40.0000 mg | Freq: Two times a day (BID) | INTRAVENOUS | Status: DC
  Administered 2020-12-02 (×2): 40 mg via INTRAVENOUS
  Filled 2020-12-01 (×2): qty 40

## 2020-12-01 MED ORDER — CLONIDINE HCL 0.1 MG PO TABS
0.2000 mg | ORAL_TABLET | Freq: Two times a day (BID) | ORAL | Status: DC
  Administered 2020-12-02: 0.2 mg via ORAL
  Filled 2020-12-01 (×2): qty 2

## 2020-12-01 MED ORDER — TRAZODONE HCL 50 MG PO TABS: 25.0000 mg | ORAL_TABLET | Freq: Every evening | ORAL | Status: DC | PRN

## 2020-12-01 MED ORDER — CARVEDILOL 6.25 MG PO TABS
12.5000 mg | ORAL_TABLET | Freq: Two times a day (BID) | ORAL | Status: DC
  Administered 2020-12-02 (×2): 12.5 mg via ORAL
  Filled 2020-12-01 (×2): qty 2

## 2020-12-01 MED ORDER — FAMOTIDINE 20 MG PO TABS
20.0000 mg | ORAL_TABLET | Freq: Two times a day (BID) | ORAL | Status: DC
  Administered 2020-12-02: 20 mg via ORAL
  Filled 2020-12-01: qty 1

## 2020-12-01 MED ORDER — HYDRALAZINE HCL 50 MG PO TABS: 50.0000 mg | ORAL_TABLET | Freq: Three times a day (TID) | ORAL | Status: DC | PRN

## 2020-12-01 MED ORDER — APIXABAN 5 MG PO TABS
5.0000 mg | ORAL_TABLET | Freq: Two times a day (BID) | ORAL | Status: DC
  Administered 2020-12-02: 5 mg via ORAL
  Filled 2020-12-01 (×2): qty 1

## 2020-12-01 MED ORDER — ACETAMINOPHEN 325 MG PO TABS: 650.0000 mg | ORAL_TABLET | Freq: Four times a day (QID) | ORAL | Status: DC | PRN

## 2020-12-01 MED ORDER — ACETAMINOPHEN 650 MG RE SUPP: 650.0000 mg | Freq: Four times a day (QID) | RECTAL | Status: DC | PRN

## 2020-12-01 MED ORDER — ONDANSETRON HCL 4 MG/2ML IJ SOLN
4.0000 mg | Freq: Once | INTRAMUSCULAR | Status: AC
  Administered 2020-12-01: 4 mg via INTRAVENOUS
  Filled 2020-12-01: qty 2

## 2020-12-01 MED ORDER — LACTATED RINGERS IV BOLUS
1000.0000 mL | Freq: Once | INTRAVENOUS | Status: AC
  Administered 2020-12-01: 1000 mL via INTRAVENOUS

## 2020-12-01 MED ORDER — PRAVASTATIN SODIUM 20 MG PO TABS
20.0000 mg | ORAL_TABLET | Freq: Every day | ORAL | Status: DC
  Filled 2020-12-01 (×3): qty 1

## 2020-12-01 MED ORDER — ONDANSETRON HCL 4 MG/2ML IJ SOLN: 4.0000 mg | Freq: Four times a day (QID) | INTRAMUSCULAR | Status: DC | PRN

## 2020-12-01 MED ORDER — ONDANSETRON HCL 4 MG PO TABS: 4.0000 mg | ORAL_TABLET | Freq: Four times a day (QID) | ORAL | Status: DC | PRN

## 2020-12-01 MED ORDER — MAGNESIUM HYDROXIDE 400 MG/5ML PO SUSP: 30.0000 mL | Freq: Every day | ORAL | Status: DC | PRN

## 2020-12-01 MED ORDER — VITAMIN B-12 1000 MCG PO TABS
3000.0000 ug | ORAL_TABLET | Freq: Every day | ORAL | Status: DC
  Administered 2020-12-02: 3000 ug via ORAL
  Filled 2020-12-01: qty 3

## 2020-12-01 MED ORDER — NITROGLYCERIN 0.4 MG SL SUBL: 0.4000 mg | SUBLINGUAL_TABLET | SUBLINGUAL | Status: DC | PRN

## 2020-12-01 MED ORDER — LEVOTHYROXINE SODIUM 50 MCG PO TABS
100.0000 ug | ORAL_TABLET | Freq: Every day | ORAL | Status: DC
  Administered 2020-12-02: 100 ug via ORAL
  Filled 2020-12-01: qty 2

## 2020-12-01 MED ORDER — CHLORTHALIDONE 25 MG PO TABS
12.5000 mg | ORAL_TABLET | ORAL | Status: DC
  Filled 2020-12-01: qty 0.5

## 2020-12-01 MED ORDER — IRBESARTAN 150 MG PO TABS
300.0000 mg | ORAL_TABLET | Freq: Every day | ORAL | Status: DC
  Administered 2020-12-02: 300 mg via ORAL
  Filled 2020-12-01: qty 2

## 2020-12-01 MED ORDER — SODIUM CHLORIDE 0.9 % IV SOLN: INTRAVENOUS | Status: DC

## 2020-12-01 NOTE — H&P (Signed)
[place]   PATIENT NAME: Kayla [name]    MR#:  [id number]  DATE OF BIRTH:  12-Oct-1926  DATE OF ADMISSION:  12/01/2020  PRIMARY CARE PHYSICIAN: Lynnea Ferrier, MD   Patient is coming from: Home  REQUESTING/REFERRING PHYSICIAN: Antoine Primas, MD  CHIEF COMPLAINT:   Chief Complaint  Patient presents with  . Loss of Consciousness    HISTORY OF PRESENT ILLNESS:  Kayla [name] is a 87 y.o. Caucasian female with medical history significant for coronary artery disease, peripheral vascular disease, diastolic dysfunction, emphysema, GERD, hypertension, dyslipidemia and hypothyroidism, paroxysmal atrial fibrillation on Eliquis and left bundle branch block, who presented to the ER with acute onset syncope while sitting on the porch.  She apparently slumped over while sitting in her chair and that was a witnessed episode.  She was noted to have nausea and vomiting.  On route to the hospital with EMS she had another syncopal episode lasted less than a minute.  She was having nausea and mid abdominal pain upon arrival to the ER.  She admitted to malaise this afternoon.  No bilious vomitus or hematemesis.  No melena or bright red blood per rectum.  No diarrhea or constipation.  She denies any chest pain or palpitations.  No cough or wheezing hemoptysis.  No dysuria, oliguria or hematuria or flank pain.  ED Course: Upon presentation to the ER blood pressure was 185/52 with otherwise normal vital signs.  Venous blood gas was unremarkable.  CBC showed hemoglobin of 11.8 hematocrit 35.2 and CMP revealed sodium 131 with a BUN of 25 and magnesium 1.9 with potassium of 3.7.  TSH was 1.3. EKG as reviewed by me :  showed junctional rhythm with a rate of 63 with left bundle branch block.  Imaging: Noncontrasted CT scan revealed no acute intracranial normalities.  It showed atrophy and chronic small vessel changes in the white matter with chronic infarcts in left basal ganglia and right cerebellum.   Abdominal pelvic CT scan revealed no acute abdominal or pelvic abnormality.  It showed moderate hiatal hernia and focal underdistention versus mass/wall thickening within distal transverse colon which may be correlated with colonoscopy if deemed clinically appropriate and no obstructive changes.  There were findings suggestive of constipation.  The patient was given 1 L bolus of IV lactated Ringer, 1 mg of IV Versed and 4 mg of IV Zofran.  She will be admitted to an observation medically monitored bed for further evaluation and management. PAST MEDICAL HISTORY:   Past Medical History:  Diagnosis Date  . Arthritis   . Bundle branch block, left    Chronic  . CAD (coronary artery disease)    a. 2001 s/p CABG @ [facility]; b. 2009 Neg MV; c. 12/2019 MV: EF 52%, no ischemia.  . Carotid arterial disease (HCC)    a. 10/2019 Carotid U/S: RICA 40-59, LICA 1-39. F/u 2 yrs.  . Cholecystitis    from record  . Diastolic dysfunction    a. 06/2019 Echo: EF 55-60%, Gr2 DD.  [name] Emphysema lung (HCC)   . GERD (gastroesophageal reflux disease)   . Heart murmur   . HOH (hard of hearing)   . Hyperlipidemia   . Hypertension   . Hypothyroidism   . Orthostatic headache    Mild  . PAF (paroxysmal atrial fibrillation) (HCC)    a. Dx 10/2019 - converted spontaneously to sinus ED; b. CHA2DS2VASc = 5-->Eliquis.  . Pain    HIP AND LEG PAIN  USES  CANE  . 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 03/2019 renal duplex w/ incidental finding of celia and SMA stenosis; b. 09/2019 Mesenteric Duplex: Nl Celiac, Hepatic, and Splenic arteries. 70-99% SMA stenosis-->Seen by vasc surgery; c. 01/2020 SMA 75-80%(7x26 balloon expandable stent). Celiac 80-90%.  . Valvular heart disease    a. 06/2019 Echo: EF 55-60%, Gr2 DD. No rwma. Nl RV fxn. Mild-mod MR. Mild TR/AI/PR. Mild to mod Ao sclerosis w/o stenosis.  . Vertigo     PAST SURGICAL HISTORY:   Past Surgical History:  Procedure Laterality Date  . APPENDECTOMY  1957  .  BREAST BIOPSY Left 2011   neg, stereo done by Dr. Evette CristalSankar  . BREAST BIOPSY    . CARDIAC CATHETERIZATION  2000   [facility]  . CATARACT EXTRACTION Right    right 2004, left 2018   . CATARACT EXTRACTION W/PHACO Left 04/12/2017   Procedure: CATARACT EXTRACTION PHACO AND INTRAOCULAR LENS PLACEMENT (IOC);  Surgeon: Lockie MolaBrasington, Chadwick, MD;  Location: [facility] ORS;  Service: Ophthalmology;  Laterality: Left;  US 01:09.0AP% 23.7CDE 16.35FLUID PACK LOT # [id number]  . CHOLECYSTECTOMY N/A 04/25/2016   Procedure: LAPAROSCOPIC CHOLECYSTECTOMY WITH INTRAOPERATIVE CHOLANGIOGRAM;  Surgeon: Kieth BrightlySeeplaputhur G Sankar, MD;  Location: [facility] ORS;  Service: General;  Laterality: N/A;  . CORONARY ANGIOPLASTY     STENT  . CORONARY ARTERY BYPASS GRAFT  2001  . KNEE SURGERY    . thyroid  1970  . UPPER GI ENDOSCOPY  2014  . VISCERAL ANGIOGRAPHY N/A 01/15/2020   Procedure: VISCERAL ANGIOGRAPHY;  Surgeon: Annice Needyew, Jason S, MD;  Location: [facility] INVASIVE CV LAB;  Service: Cardiovascular;  Laterality: N/A;    SOCIAL HISTORY:   Social History   Tobacco Use  . Smoking status: Never Smoker  . Smokeless tobacco: Never Used  Substance Use Topics  . Alcohol use: No    FAMILY HISTORY:   Family History  Problem Relation Age of Onset  . Heart failure Mother 2076  . Heart failure Sister 5348  . Heart attack Sister   . Heart attack Brother 42  . Breast cancer Neg Hx     DRUG ALLERGIES:   Allergies  Allergen Reactions  . Bee Pollen     Other reaction(s): Other (See Comments)  . Boniva [Ibandronic Acid]     Jaw pain  . Pollen Extract   . Motrin [Ibuprofen] Rash    REVIEW OF SYSTEMS:   ROS As per history of present illness. All pertinent systems were reviewed above. Constitutional, HEENT, cardiovascular, respiratory, GI, GU, musculoskeletal, neuro, psychiatric, endocrine, integumentary and hematologic systems were reviewed and are otherwise negative/unremarkable except for positive findings mentioned above in the  HPI.   MEDICATIONS AT HOME:   Prior to Admission medications   Medication Sig Start Date End Date Taking? Authorizing Provider  carvedilol (COREG) 12.5 MG tablet TAKE 1 TABLET BY MOUTH  TWICE DAILY 06/23/20   Allegra GranaArnett, Margaret G, FNP  chlorthalidone (HYGROTON) 25 MG tablet Take 0.5 tablets (12.5 mg total) by mouth every other day. 11/02/20   Alver SorrowWalker, Caitlin S, NP  cloNIDine (CATAPRES) 0.2 MG tablet TAKE 1 TABLET BY MOUTH  TWICE DAILY 11/26/20   Antonieta IbaGollan, Timothy J, MD  Cyanocobalamin (VITAMIN B 12 PO) Take 3,000 Units by mouth daily.    [provider]  ELIQUIS 5 MG TABS tablet TAKE 1 TABLET BY MOUTH  TWICE DAILY 08/31/20   Antonieta IbaGollan, Timothy J, MD  famotidine (PEPCID) 20 MG tablet TAKE 1 TABLET BY MOUTH  TWICE DAILY  04/07/20   Allegra Grana, FNP  hydrALAZINE (APRESOLINE) 50 MG tablet Take 1 tablet (50 mg total) by mouth 3 (three) times daily as needed. 11/26/20   Antonieta Iba, MD  levothyroxine (SYNTHROID) 100 MCG tablet Take 100 mcg by mouth every morning. 11/17/20   [provider]  nitroGLYCERIN (NITROSTAT) 0.4 MG SL tablet Place 1 tablet (0.4 mg total) under the tongue every 5 (five) minutes as needed for chest pain. 03/21/19   Allegra Grana, FNP  pravastatin (PRAVACHOL) 20 MG tablet TAKE 1 TABLET BY MOUTH AT  BEDTIME 06/23/20   Allegra Grana, FNP  valsartan (DIOVAN) 320 MG tablet Take 1 tablet (320 mg total) by mouth daily. 11/29/20   Antonieta Iba, MD      VITAL SIGNS:  Blood pressure (!) 154/66, pulse (!) 101, temperature (!) 97.4 F (36.3 C), temperature source Rectal, resp. rate (!) 22, height 5\' 1"  (1.549 m), weight 66 kg, SpO2 96 %.  PHYSICAL EXAMINATION:  Physical Exam  GENERAL:  87 y.o.-year-old patient lying in the bed with no acute distress.  EYES: Pupils equal, round, reactive to light and accommodation. No scleral icterus. Extraocular muscles intact.  HEENT: Head atraumatic, normocephalic. Oropharynx and nasopharynx clear.  NECK:  Supple, no  jugular venous distention. No thyroid enlargement, no tenderness.  LUNGS: Normal breath sounds bilaterally, no wheezing, rales,rhonchi or crepitation. No use of accessory muscles of respiration.  CARDIOVASCULAR: Regular rate and rhythm, S1, S2 normal. No murmurs, rubs, or gallops.  ABDOMEN: Soft, nondistended, with mild epigastric and periumbilical tenderness without rebound tenderness, guarding or rigidity.. Bowel sounds present. No organomegaly or mass.  EXTREMITIES: No pedal edema, cyanosis, or clubbing.  NEUROLOGIC: Cranial nerves II through XII are intact. Muscle strength 5/5 in all extremities. Sensation intact. Gait not checked.  PSYCHIATRIC: The patient is alert and oriented x 3.  Normal affect and good eye contact. SKIN: No obvious rash, lesion, or ulcer.   LABORATORY PANEL:   CBC Recent Labs  Lab 12/01/20 2037  WBC 6.4  HGB 11.8*  HCT 35.2*  PLT 176   ------------------------------------------------------------------------------------------------------------------  Chemistries  Recent Labs  Lab 12/01/20 2037  NA 131*  K 3.7  CL 98  CO2 21*  GLUCOSE 145*  BUN 25*  CREATININE 1.03*  CALCIUM 8.7*  MG 1.9  AST 16  ALT 10  ALKPHOS 44  BILITOT 0.6   ------------------------------------------------------------------------------------------------------------------  Cardiac Enzymes No results for input(s): TROPONINI in the last 168 hours. ------------------------------------------------------------------------------------------------------------------  RADIOLOGY:  CT ABDOMEN PELVIS WO CONTRAST  Result Date: 12/01/2020 CLINICAL DATA:  Vomiting altered EXAM: CT ABDOMEN AND PELVIS WITHOUT CONTRAST TECHNIQUE: Multidetector CT imaging of the abdomen and pelvis was performed following the standard protocol without IV contrast. COMPARISON:  CT 03/28/2016 FINDINGS: Lower chest: Lung bases demonstrate scarring or atelectasis at the lingula. No acute consolidation or effusion.  Extensive coronary vascular calcification. Borderline cardiomegaly. Moderate hiatal hernia Hepatobiliary: No focal liver abnormality is seen. Status post cholecystectomy. No biliary dilatation. Pancreas: Unremarkable. No pancreatic ductal dilatation or surrounding inflammatory changes. Spleen: Normal in size without focal abnormality. Adrenals/Urinary Tract: Adrenal glands are normal. Kidneys show no hydronephrosis. Small cyst midpole right kidney. The bladder is unremarkable. Stomach/Bowel: Stomach is within normal limits. Appendix not well seen but no right lower quadrant inflammatory process. No evidence of bowel wall thickening, distention, or inflammatory changes. Moderate stool in the colon. Moderate fecal ball in the rectum. Possible area of colon wall thickening at the distal transverse colon, series 2, image  30. Vascular/Lymphatic: Advanced aortic atherosclerosis with probable stent in the superior mesenteric artery. No aneurysmal dilatation. No suspicious nodes. Reproductive: Uterus and bilateral adnexa are unremarkable. Other: Negative for free air or free fluid Musculoskeletal: No acute or suspicious osseous abnormality. IMPRESSION: 1. No CT evidence for acute intra-abdominal or pelvic abnormality. 2. Moderate hiatal hernia 3. Focal under distension versus mass/wall thickening within the distal transverse colon which may be correlated with colonoscopy if deemed clinically appropriate. No obstructive changes proximal to this area though there is large volume of stool in the colon and rectum suggesting constipation Electronically Signed   By: Jasmine Pang M.D.   On: 12/01/2020 21:43   CT Head Wo Contrast  Result Date: 12/01/2020 CLINICAL DATA:  Mental status change EXAM: CT HEAD WITHOUT CONTRAST TECHNIQUE: Contiguous axial images were obtained from the base of the skull through the vertex without intravenous contrast. COMPARISON:  CT brain 04/10/2019, MRI 04/10/2019 FINDINGS: Brain: No acute  territorial infarction, hemorrhage or intracranial mass. Mild atrophy. Moderate chronic small vessel ischemic change of the white matter. Chronic lacunar infarct left basal ganglia. Small chronic infarct right cerebellum. Stable ventricle size Vascular: No hyperdense vessels. Vertebral and carotid vascular calcification Skull: Normal. Negative for fracture or focal lesion. Sinuses/Orbits: Completely opacified right maxillary sinus. Mucosal thickening in the ethmoid sinuses Other: None IMPRESSION: 1. No CT evidence for acute intracranial abnormality. 2. Atrophy and chronic small vessel ischemic change of the white matter. 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   On: 12/01/2020 21:33   DG Chest Port 1 View  Result Date: 12/01/2020 CLINICAL DATA:  Syncope. EXAM: PORTABLE CHEST 1 VIEW COMPARISON:  October 21, 2019 FINDINGS: Multiple sternal wires and vascular clips are seen. There is no evidence of acute infiltrate, pleural effusion or pneumothorax. The heart size and mediastinal contours are within normal limits. Marked severity calcification of the aortic arch is noted. Surgical clips are seen within the right upper quadrant. Degenerative changes seen throughout the thoracic spine. IMPRESSION: 1. Evidence of prior median sternotomy/CABG. 2. No acute cardiopulmonary disease. Electronically Signed   By: Aram Candela M.D.   On: 12/01/2020 20:57      IMPRESSION AND PLAN:  Active Problems:   Syncope  1.  Syncope.  Differential diagnosis would include neurally mediated that is likely the etiology especially given nausea and vomiting, orthostatic hypotension though she was sitting.  Patient was not reportedly hypoglycemic.  We will need to rule out arrhythmia. - The patient will be admitted to an observation medically monitored bed. - We will check orthostatics every 12 hours. - She will be hydrated with IV normal saline. - We will  monitor her for arrhythmias. - She had a recent carotid Doppler that showed 45 to 59% right carotid stenosis and less than 39% left carotid stenosis and a recent echo which showed an EF of 50 to 55% with mild to moderate mitral regurgitation and mild aortic regurgitation.  2.  Nausea and vomiting concerning for acute gastritis that could be the culprit for #1. - The patient will be placed on IV PPI therapy. - As needed antiemetics will be provided. - Diet to be advanced as tolerated.  3.  Hypertensive urgency. - We will continue Catapres, hydralazine and Coreg. - We will place the patient on as needed IV labetalol.  4.  Paroxysmal atrial fibrillation. - We will continue Coreg and Eliquis.  5.  Dyslipidemia. - We will continue  statin therapy.  6.  GERD. - The patient will be on IV PPI therapy as mentioned above.  7.  Coronary artery disease without angina. - We will continue as needed sublingual nitroglycerin as well as beta-blocker and statin therapy.  8.  Vitamin B12 deficiency. - Continue vitamin B12  DVT prophylaxis: Lovenox. Code Status: full code.  Family Communication:  The plan of care was discussed in details with the patient (and her son who is with her in the room.). I answered all questions. The patient agreed to proceed with the above mentioned plan. Further management will depend upon hospital course. Disposition Plan: Back to previous home environment Consults called: none. All the records are reviewed and case discussed with ED provider.  Status is: Observation  The patient remains OBS appropriate and will d/c before 2 midnights.  Dispo: The patient is from: Home              Anticipated d/c is to: Home              Patient currently is not medically stable to d/c.   Difficult to place patient No  TOTAL TIME TAKING CARE OF THIS PATIENT: 55 minutes.    Hannah Beat M.D on 12/01/2020 at 11:41 PM  [facility]   From 7 PM-7 AM, contact  night-coverage [email or web address]  CC: Primary care physician; Lynnea Ferrier, MD

## 2020-12-01 NOTE — ED Triage Notes (Signed)
Pt to ED via EMS from home. Per EMS, pt was sitting on the porch with family and collapsed. Pt started to vomit constantly after. Pt was given 4mg  of Zofran. Pt did become alert for EMS once and then passed out again. Upon arrival pt is a/o x1.

## 2020-12-01 NOTE — ED Provider Notes (Signed)
[facility] Emergency Department Provider Note  ____________________________________________   Event Date/Time   First MD Initiated Contact with Patient 12/01/20 2036     (approximate)  I have reviewed the triage vital signs and the nursing notes.   HISTORY  Chief Complaint Loss of Consciousness   HPI Kayla [name] is a 87 y.o. female with a past medical history of arthritis, CAD, HTN, HDL, paroxysmal A. fib on Eliquis, GERD, emphysema, CHF, left bundle branch block, osteoarthritis and vertigo who presents EMS from home where she had a witnessed syncopal episode.  Per EMS she was sitting on her front porch and family witnessed and started to slump over have some nausea and vomiting.  She was able to state her name with EMS but I am forwarding additional history and had another syncopal episode that lasted less than a minute with EMS.  On arrival she states she has a little nausea and abdominal pain.  She states she started feeling bad this afternoon and was feeling in her usual state of health this morning.  She has no headache chest pain fevers, back pain or recent urinary symptoms.  No diarrhea.         Past Medical History:  Diagnosis Date  . Arthritis   . Bundle branch block, left    Chronic  . CAD (coronary artery disease)    a. 2001 s/p CABG @ [facility]; b. 2009 Neg MV; c. 12/2019 MV: EF 52%, no ischemia.  . Carotid arterial disease (HCC)    a. 10/2019 Carotid U/S: RICA 40-59, LICA 1-39. F/u 2 yrs.  . Cholecystitis    from record  . Diastolic dysfunction    a. 06/2019 Echo: EF 55-60%, Gr2 DD.  [name] Emphysema lung (HCC)   . GERD (gastroesophageal reflux disease)   . Heart murmur   . HOH (hard of hearing)   . Hyperlipidemia   . Hypertension   . Hypothyroidism   . Orthostatic headache    Mild  . PAF (paroxysmal atrial fibrillation) (HCC)    a. Dx 10/2019 - converted spontaneously to sinus ED; b. CHA2DS2VASc = 5-->Eliquis.  . Pain    HIP AND LEG  PAIN USES  CANE  . 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 03/2019 renal duplex w/ incidental finding of celia and SMA stenosis; b. 09/2019 Mesenteric Duplex: Nl Celiac, Hepatic, and Splenic arteries. 70-99% SMA stenosis-->Seen by vasc surgery; c. 01/2020 SMA 75-80%(7x26 balloon expandable stent). Celiac 80-90%.  . Valvular heart disease    a. 06/2019 Echo: EF 55-60%, Gr2 DD. No rwma. Nl RV fxn. Mild-mod MR. Mild TR/AI/PR. Mild to mod Ao sclerosis w/o stenosis.  . Vertigo     Patient Active Problem List   Diagnosis Date Noted  . Pain in limb 05/25/2020  . Diastolic dysfunction 04/19/2020  . Paroxysmal atrial fibrillation (HCC) 04/19/2020  . Mesenteric ischemia (HCC) 03/12/2020  . CAD (coronary artery disease)   . Syncope 12/24/2019  . Atrial fibrillation (HCC) 11/26/2019  . Osteoarthritis of knee 10/24/2019  . Low grade fever 10/20/2019  . Chest pain 03/21/2019  . Osteoporosis 08/27/2017  . Carotid stenosis 08/08/2016  . Chronic kidney disease 08/08/2016  . Hypothyroidism 08/08/2016  . Hypertension 01/09/2013  . Essential hypertension 01/09/2013  . Hyperlipidemia 07/07/2011  . GERD (gastroesophageal reflux disease) 06/10/2010  . CAD (coronary artery disease), autologous vein bypass graft 04/15/2009    Past Surgical History:  Procedure Laterality Date  . APPENDECTOMY  1957  . BREAST BIOPSY  Left 2011   neg, stereo done by Dr. Evette Cristal  . BREAST BIOPSY    . CARDIAC CATHETERIZATION  2000   [facility]  . CATARACT EXTRACTION Right    right 2004, left 2018   . CATARACT EXTRACTION W/PHACO Left 04/12/2017   Procedure: CATARACT EXTRACTION PHACO AND INTRAOCULAR LENS PLACEMENT (IOC);  Surgeon: Lockie Mola, MD;  Location: [facility] ORS;  Service: Ophthalmology;  Laterality: Left;  [place] 01:09.0AP% 23.7CDE 16.35FLUID PACK LOT # [id number]  . CHOLECYSTECTOMY N/A 04/25/2016   Procedure: LAPAROSCOPIC CHOLECYSTECTOMY WITH INTRAOPERATIVE CHOLANGIOGRAM;  Surgeon: Kieth Brightly, MD;   Location: [facility] ORS;  Service: General;  Laterality: N/A;  . CORONARY ANGIOPLASTY     STENT  . CORONARY ARTERY BYPASS GRAFT  2001  . KNEE SURGERY    . thyroid  1970  . UPPER GI ENDOSCOPY  2014  . VISCERAL ANGIOGRAPHY N/A 01/15/2020   Procedure: VISCERAL ANGIOGRAPHY;  Surgeon: Annice Needy, MD;  Location: [facility] INVASIVE CV LAB;  Service: Cardiovascular;  Laterality: N/A;    Prior to Admission medications   Medication Sig Start Date End Date Taking? Authorizing Provider  carvedilol (COREG) 12.5 MG tablet TAKE 1 TABLET BY MOUTH  TWICE DAILY 06/23/20   Allegra Grana, FNP  chlorthalidone (HYGROTON) 25 MG tablet Take 0.5 tablets (12.5 mg total) by mouth every other day. 11/02/20   Alver Sorrow, NP  cloNIDine (CATAPRES) 0.2 MG tablet TAKE 1 TABLET BY MOUTH  TWICE DAILY 11/26/20   Antonieta Iba, MD  Cyanocobalamin (VITAMIN B 12 PO) Take 3,000 Units by mouth daily.    [provider]  ELIQUIS 5 MG TABS tablet TAKE 1 TABLET BY MOUTH  TWICE DAILY 08/31/20   Antonieta Iba, MD  famotidine (PEPCID) 20 MG tablet TAKE 1 TABLET BY MOUTH  TWICE DAILY 04/07/20   Allegra Grana, FNP  hydrALAZINE (APRESOLINE) 50 MG tablet Take 1 tablet (50 mg total) by mouth 3 (three) times daily as needed. 11/26/20   Antonieta Iba, MD  levothyroxine (SYNTHROID) 100 MCG tablet Take 100 mcg by mouth every morning. 11/17/20   [provider]  nitroGLYCERIN (NITROSTAT) 0.4 MG SL tablet Place 1 tablet (0.4 mg total) under the tongue every 5 (five) minutes as needed for chest pain. 03/21/19   Allegra Grana, FNP  pravastatin (PRAVACHOL) 20 MG tablet TAKE 1 TABLET BY MOUTH AT  BEDTIME 06/23/20   Allegra Grana, FNP  valsartan (DIOVAN) 320 MG tablet Take 1 tablet (320 mg total) by mouth daily. 11/29/20   Antonieta Iba, MD    Allergies Bee pollen, Boniva [ibandronic acid], Pollen extract, and Motrin [ibuprofen]  Family History  Problem Relation Age of Onset  . Heart failure Mother 53  .  Heart failure Sister 24  . Heart attack Sister   . Heart attack Brother 42  . Breast cancer Neg Hx     Social History Social History   Tobacco Use  . Smoking status: Never Smoker  . Smokeless tobacco: Never Used  Vaping Use  . Vaping Use: Never used  Substance Use Topics  . Alcohol use: No  . Drug use: No    Review of Systems  Review of Systems  Constitutional: Negative for chills and fever.  HENT: Negative for sore throat.   Eyes: Negative for pain.  Respiratory: Negative for cough and stridor.   Cardiovascular: Negative for chest pain.  Gastrointestinal: Positive for abdominal pain, nausea and vomiting.  Genitourinary: Negative for dysuria.  Musculoskeletal: Negative  for myalgias.  Skin: Negative for rash.  Neurological: Positive for loss of consciousness. Negative for seizures and headaches.  Psychiatric/Behavioral: Negative for suicidal ideas.  All other systems reviewed and are negative.     ____________________________________________   PHYSICAL EXAM:  VITAL SIGNS: ED Triage Vitals [12/01/20 2035]  Enc Vitals Group     BP (!) 185/52     Pulse Rate 64     Resp 14     Temp (!) 97.4 F (36.3 C)     Temp Source Rectal     SpO2 96 %     Weight      Height      Head Circumference      Peak Flow      Pain Score      Pain Loc      Pain Edu?      Excl. in GC?    Vitals:   12/01/20 2200 12/01/20 2230  BP: (!) 151/78 (!) 154/66  Pulse: 64 (!) 101  Resp: 17 (!) 22  Temp:    SpO2: 95% 96%   Physical Exam Vitals and nursing note reviewed.  Constitutional:      General: She is not in acute distress.    Appearance: She is well-developed.  HENT:     Head: Normocephalic and atraumatic.     Right Ear: External ear normal.     Left Ear: External ear normal.     Nose: Nose normal.  Eyes:     Conjunctiva/sclera: Conjunctivae normal.  Cardiovascular:     Rate and Rhythm: Normal rate and regular rhythm.     Heart sounds: No murmur  heard.   Pulmonary:     Effort: Pulmonary effort is normal. No respiratory distress.     Breath sounds: Normal breath sounds.  Abdominal:     Palpations: Abdomen is soft.     Tenderness: There is abdominal tenderness. There is no right CVA tenderness or left CVA tenderness.  Musculoskeletal:     Cervical back: Neck supple.  Skin:    General: Skin is warm and dry.     Capillary Refill: Capillary refill takes 2 to 3 seconds.  Neurological:     General: No focal deficit present.     Mental Status: She is alert.     PERRLA.  EOMI.  Cranial nerves II through XII grossly intact.  Patient is able to grip this examiner with full strength in her bilateral upper extremities.  She is able to lift both legs off the bed on command.  Sensation is intact to light touch throughout both extremities upper and lower.  2+ radial and DP pulses.  Abdomen is mildly tender throughout but there is no CVA tenderness.  She is nonrigid no guarding or peritonitis.  Very small hemostatic skin tear to the right lower leg midway between the ankle and shin. ____________________________________________   LABS (all labs ordered are listed, but only abnormal results are displayed)  Labs Reviewed  CBC WITH DIFFERENTIAL/PLATELET - Abnormal; Notable for the following components:      Result Value   RBC 3.82 (*)    Hemoglobin 11.8 (*)    HCT 35.2 (*)    All other components within normal limits  COMPREHENSIVE METABOLIC PANEL - Abnormal; Notable for the following components:   Sodium 131 (*)    CO2 21 (*)    Glucose, Bld 145 (*)    BUN 25 (*)    Creatinine, Ser 1.03 (*)    Calcium 8.7 (*)  Total Protein 5.9 (*)    GFR, Estimated 51 (*)    All other components within normal limits  URINALYSIS, COMPLETE (UACMP) WITH MICROSCOPIC - Abnormal; Notable for the following components:   Color, Urine YELLOW (*)    APPearance CLEAR (*)    All other components within normal limits  BLOOD GAS, VENOUS - Abnormal; Notable  for the following components:   pCO2, Ven 41 (*)    pO2, Ven 92.0 (*)    All other components within normal limits  CBG MONITORING, ED - Abnormal; Notable for the following components:   Glucose-Capillary 129 (*)    All other components within normal limits  RESP PANEL BY RT-PCR (FLU A&B, COVID) ARPGX2  LACTIC ACID, PLASMA  LACTIC ACID, PLASMA  TSH  MAGNESIUM  PROCALCITONIN  PROCALCITONIN  TROPONIN I (HIGH SENSITIVITY)  TROPONIN I (HIGH SENSITIVITY)   ____________________________________________  EKG  Junctional rhythm with a ventricular rate of 63, left bundle branch block and multiple nonspecific ST changes in anterior lateral and inferior leads. ____________________________________________  RADIOLOGY  ED MD interpretation: CT head shows no evidence of acute intracranial abnormality.  There is evidence of atrophy and chronic small vessel ischemic changes.  There is also evidence of chronic infarction left basal ganglia and right cerebellum.  Chest x-ray has no focal consolidation, effusion, significant edema, pneumothorax but there is evidence of prior sternotomy with CABG. CT abdomen pelvis shows no clear acute intra-abdominal or pelvic process but there is a moderate hiatal hernia and focal distention of the transverse colon somewhat nonspecific.  Official radiology report(s): CT ABDOMEN PELVIS WO CONTRAST  Result Date: 12/01/2020 CLINICAL DATA:  Vomiting altered EXAM: CT ABDOMEN AND PELVIS WITHOUT CONTRAST TECHNIQUE: Multidetector CT imaging of the abdomen and pelvis was performed following the standard protocol without IV contrast. COMPARISON:  CT 03/28/2016 FINDINGS: Lower chest: Lung bases demonstrate scarring or atelectasis at the lingula. No acute consolidation or effusion. Extensive coronary vascular calcification. Borderline cardiomegaly. Moderate hiatal hernia Hepatobiliary: No focal liver abnormality is seen. Status post cholecystectomy. No biliary dilatation. Pancreas:  Unremarkable. No pancreatic ductal dilatation or surrounding inflammatory changes. Spleen: Normal in size without focal abnormality. Adrenals/Urinary Tract: Adrenal glands are normal. Kidneys show no hydronephrosis. Small cyst midpole right kidney. The bladder is unremarkable. Stomach/Bowel: Stomach is within normal limits. Appendix not well seen but no right lower quadrant inflammatory process. No evidence of bowel wall thickening, distention, or inflammatory changes. Moderate stool in the colon. Moderate fecal ball in the rectum. Possible area of colon wall thickening at the distal transverse colon, series 2, image 30. Vascular/Lymphatic: Advanced aortic atherosclerosis with probable stent in the superior mesenteric artery. No aneurysmal dilatation. No suspicious nodes. Reproductive: Uterus and bilateral adnexa are unremarkable. Other: Negative for free air or free fluid Musculoskeletal: No acute or suspicious osseous abnormality. IMPRESSION: 1. No CT evidence for acute intra-abdominal or pelvic abnormality. 2. Moderate hiatal hernia 3. Focal under distension versus mass/wall thickening within the distal transverse colon which may be correlated with colonoscopy if deemed clinically appropriate. No obstructive changes proximal to this area though there is large volume of stool in the colon and rectum suggesting constipation Electronically Signed   By: Jasmine Pang M.D.   On: 12/01/2020 21:43   CT Head Wo Contrast  Result Date: 12/01/2020 CLINICAL DATA:  Mental status change EXAM: CT HEAD WITHOUT CONTRAST TECHNIQUE: Contiguous axial images were obtained from the base of the skull through the vertex without intravenous contrast. COMPARISON:  CT brain 04/10/2019, MRI 04/10/2019  FINDINGS: Brain: No acute territorial infarction, hemorrhage or intracranial mass. Mild atrophy. Moderate chronic small vessel ischemic change of the white matter. Chronic lacunar infarct left basal ganglia. Small chronic infarct right  cerebellum. Stable ventricle size Vascular: No hyperdense vessels. Vertebral and carotid vascular calcification Skull: Normal. Negative for fracture or focal lesion. Sinuses/Orbits: Completely opacified right maxillary sinus. Mucosal thickening in the ethmoid sinuses Other: None IMPRESSION: 1. No CT evidence for acute intracranial abnormality. 2. Atrophy and chronic small vessel ischemic change of the white matter. 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   On: 12/01/2020 21:33   DG Chest Port 1 View  Result Date: 12/01/2020 CLINICAL DATA:  Syncope. EXAM: PORTABLE CHEST 1 VIEW COMPARISON:  October 21, 2019 FINDINGS: Multiple sternal wires and vascular clips are seen. There is no evidence of acute infiltrate, pleural effusion or pneumothorax. The heart size and mediastinal contours are within normal limits. Marked severity calcification of the aortic arch is noted. Surgical clips are seen within the right upper quadrant. Degenerative changes seen throughout the thoracic spine. IMPRESSION: 1. Evidence of prior median sternotomy/CABG. 2. No acute cardiopulmonary disease. Electronically Signed   By: Aram Candela M.D.   On: 12/01/2020 20:57    ____________________________________________   PROCEDURES  Procedure(s) performed (including Critical Care):  .1-3 Lead EKG Interpretation Performed by: Gilles Chiquito, MD Authorized by: Gilles Chiquito, MD     Interpretation: abnormal     ECG rate assessment: normal     Rhythm: other rhythm     Rhythm comment:  Junctional    Conduction: abnormal       ____________________________________________   INITIAL IMPRESSION / ASSESSMENT AND PLAN / ED COURSE      Patient presents with above-stated history and exam after a syncopal episode witnessed by her family when she was sitting on a porch earlier today associate with some nausea and vomiting.  She was barely responsive with EMS  had some vomiting received 4 of Zofran before having another brief syncopal episode in route.  On arrival she is hypertensive with BP of 185/52 with otherwise stable vital signs on room air.  She endorses some nausea abdominal pain but denies any other recent or associated sick symptoms.  She has a nonfocal supine neuro exam.   Initial differential is quite broad and includes acute infectious process, CVA, arrhythmia, ACS, and metabolic derangements.  Clear focal deficits aphasia facial droop findings on CT to suggest CVA at this time.  ECG has multiple nonspecific findings and slightly prolonged QTC but troponin is nonelevated we will plan to obtain a 2-hour troponin given patient's cardiac history.  CBC shows no leukocytosis and hemoglobin of 11.8 compared to thirteen 1 month ago.  Do not believe patient had acute symptomatic anemia as precipitating cause for syncope.  CMP remarkable for NA of 131 compared to 129 1 month ago without any other significant electrolyte or metabolic derangements.  TSH and magnesium are unremarkable.  Lactic is nonelevated and overall given absence of fever or leukocytosis I have a low suspicion for sepsis at this time.  ABG without evidence of hypercarbic respiratory failure.  Low suspicion for PE as patient states she takes her Eliquis as directed.  UA does not appear infected.  Chest x-ray is unremarkable  CT abdomen pelvis shows no clear acute intra-abdominal or pelvic process but there is a moderate hiatal hernia and focal distention of the transverse colon somewhat nonspecific.  Overall and etiology for patient's syncope although certainly possible she has acute gastritis and subsequent dehydration.  However given she is still tachycardic endorsing some nausea on my reassessment after she received 4 of Zofran total and some Versed will admit to medicine service for further evaluation management and hydration.          ____________________________________________   FINAL CLINICAL IMPRESSION(S) / ED DIAGNOSES  Final diagnoses:  Syncope, unspecified syncope type  Dehydration    Medications  midazolam (VERSED) injection 1 mg (1 mg Intravenous Given 12/01/20 2042)  ondansetron (ZOFRAN) injection 4 mg (4 mg Intravenous Given 12/01/20 2212)  lactated ringers bolus 1,000 mL (1,000 mLs Intravenous New Bag/Given 12/01/20 2216)     ED Discharge Orders    None       Note:  This document was prepared using Dragon voice recognition software and may include unintentional dictation errors.   Gilles Chiquito, MD 12/01/20 2330

## 2020-12-02 DIAGNOSIS — R55 Syncope and collapse: Secondary | ICD-10-CM

## 2020-12-02 LAB — BASIC METABOLIC PANEL
Anion gap: 8 (ref 5–15)
BUN: 20 mg/dL (ref 8–23)
CO2: 24 mmol/L (ref 22–32)
Calcium: 8.5 mg/dL — ABNORMAL LOW (ref 8.9–10.3)
Chloride: 101 mmol/L (ref 98–111)
Creatinine, Ser: 0.89 mg/dL (ref 0.44–1.00)
GFR, Estimated: >60 mL/min (ref >60)
Glucose, Bld: 150 mg/dL — ABNORMAL HIGH (ref 70–99)
Potassium: 4 mmol/L (ref 3.5–5.1)
Sodium: 133 mmol/L — ABNORMAL LOW (ref 135–145)

## 2020-12-02 LAB — CBC
HCT: 34.1 % — ABNORMAL LOW (ref 36.0–46.0)
Hemoglobin: 11.5 g/dL — ABNORMAL LOW (ref 12.0–15.0)
MCH: 30.3 pg (ref 26.0–34.0)
MCHC: 33.7 g/dL (ref 30.0–36.0)
MCV: 89.7 fL (ref 80.0–100.0)
Platelets: 166 10*3/uL (ref 150–400)
RBC: 3.8 MIL/uL — ABNORMAL LOW (ref 3.87–5.11)
RDW: 13.4 % (ref 11.5–15.5)
WBC: 9.3 10*3/uL (ref 4.0–10.5)
nRBC: 0 % (ref 0.0–0.2)

## 2020-12-02 LAB — PROCALCITONIN: Procalcitonin: <0.1 ng/mL

## 2020-12-02 MED ORDER — ONDANSETRON 4 MG PO TBDP: 4.0000 mg | ORAL_TABLET | Freq: Three times a day (TID) | ORAL | 0 refills | Status: DC | PRN

## 2020-12-02 MED ORDER — LABETALOL HCL 5 MG/ML IV SOLN: 20.0000 mg | INTRAVENOUS | Status: DC | PRN

## 2020-12-02 MED ORDER — METOPROLOL TARTRATE 25 MG PO TABS: 25.0000 mg | ORAL_TABLET | Freq: Every day | ORAL | 0 refills | Status: DC | PRN

## 2020-12-02 NOTE — Progress Notes (Addendum)
Orthostatic VS with positional change on OT assessment:   Location/Position BP MAP HR  L arm supine 194/66 102 78 bpm  L arm sitting 191/85 114 72 bpm  L arm standing 164/65 92 70 bpm   Pt noted to have positive orthostatic hypotension as evidenced by systolic BP decrease by 27 mmHg and diastolic BP decrease by 20 mmHg from sitting to standing. Pt not symptomatic at this time.  RN/MD notified via secure chat.  Rejeana Brock, MS, OTR/L ascom [phone] 12/02/20, 10:31 AM

## 2020-12-02 NOTE — Evaluation (Signed)
Physical Therapy Evaluation Patient Details Name: Kayla [name] MRN: [id number] DOB: [birth year] Today's Date: 12/02/2020   History of Present Illness  Pt is 87 y/o F with PMH: arthritis, CAD, HTN, HDL, paroxysmal A. fib on Eliquis, GERD, emphysema, CHF, left bundle branch block, osteoarthritis and vertigo who presents EMS from home where she had a witnessed syncopal episode.  Per EMS she was sitting on her front porch and family witnessed and started to slump over have some nausea and vomiting. Being w/u for syncope.    Clinical Impression  Pt A&Ox4, agreeable to PT, in bed with family at bedside. Pt reported at baseline she lives with a daughter (and other daughters rotate care) who assist with IADLs, pt independent in ADLs. Ambulates with rollator.  The patient performed supine to sit modI. Good sitting balance noted. Several sit <> stand transfers performed with RW, from bed supervision with RW, very light minA from low commode in ED room. She ambulated ~25ft total in room, no dizziness/light headedness reported by pt or LOB noted. Orthostatic vitals assessed, see below for details.  Overall the patient demonstrated mild deficits (see "PT Problem List") that impede the patient's functional abilities, safety, and mobility and would benefit from skilled PT intervention. Recommendation is HHPT with intermittent supervision.     Follow Up Recommendations Home health PT;Supervision - Intermittent    Equipment Recommendations  None recommended by PT    Recommendations for Other Services       Precautions / Restrictions Precautions Precautions: Fall Restrictions Weight Bearing Restrictions: No      Mobility  Bed Mobility Overal bed mobility: Modified Independent                  Transfers Overall transfer level: Needs assistance Equipment used: Rolling walker (2 wheeled) Transfers: Sit to/from Stand Sit to Stand: Supervision         General transfer comment: increased  time, appears to be at baseline. performed twice  Ambulation/Gait Ambulation/Gait assistance: Min guard Gait Distance (Feet): 40 Feet Assistive device: Rolling walker (2 wheeled)       General Gait Details: decreased velocity, no unsteadiness noted  Stairs            Wheelchair Mobility    Modified Rankin (Stroke Patients Only)       Balance Overall balance assessment: Needs assistance Sitting-balance support: Feet supported Sitting balance-Leahy Scale: Good Sitting balance - Comments: able to perform pericare sitting on standard commode     Standing balance-Leahy Scale: Fair                               Pertinent Vitals/Pain Pain Assessment: No/denies pain    Home Living Family/patient expects to be discharged to:: Private residence Living Arrangements: Children (dtr is mentally handicap per pt's other daughter who is present in the room throughout assessment) Available Help at Discharge: Family;Available PRN/intermittently (pt has two daughters that live in the area that rotate spending the night with her) Type of Home: Mobile home Home Access: Ramped entrance     Home Layout: One level Home Equipment: Walker - 2 wheels;Wheelchair - manual;Cane - single point;Bedside commode;Shower seat;Grab bars - tub/shower      Prior Function Level of Independence: Needs assistance   Gait / Transfers Assistance Needed: MOD I with fxl mobility with 4WW primarily  ADL's / Homemaking Assistance Needed: Pt able to perform all basic self care I'ly. Has family assist for  IADLs such as cooking/cleaning/yard work        Higher education careers adviser        Extremity/Trunk Assessment   Upper Extremity Assessment Upper Extremity Assessment: Overall WFL for tasks assessed;Defer to OT evaluation    Lower Extremity Assessment Lower Extremity Assessment: Generalized weakness (ROM mostly WFLs, grossly 4-/5)    Cervical / Trunk Assessment Cervical / Trunk Assessment:  Normal  Communication   Communication: HOH  Cognition Arousal/Alertness: Awake/alert Behavior During Therapy: WFL for tasks assessed/performed Overall Cognitive Status: Within Functional Limits for tasks assessed                                        General Comments      Exercises Other Exercises Other Exercises: orthostatic vitals assessed, 20 point drop noted in systolic, pt denied symptoms   Assessment/Plan    PT Assessment Patient needs continued PT services  PT Problem List Decreased strength;Decreased activity tolerance;Decreased balance       PT Treatment Interventions DME instruction;Therapeutic exercise;Gait training;Balance training;Stair training;Neuromuscular re-education;Functional mobility training;Therapeutic activities;Patient/family education    PT Goals (Current goals can be found in the Care Plan section)  Acute Rehab PT Goals Patient Stated Goal: to figure out what's causing me to get dizzy and go home PT Goal Formulation: With patient Time For Goal Achievement: 12/16/20 Potential to Achieve Goals: Good    Frequency Min 2X/week   Barriers to discharge        Co-evaluation               AM-PAC PT "6 Clicks" Mobility  Outcome Measure Help needed turning from your back to your side while in a flat bed without using bedrails?: None Help needed moving from lying on your back to sitting on the side of a flat bed without using bedrails?: None Help needed moving to and from a bed to a chair (including a wheelchair)?: None Help needed standing up from a chair using your arms (e.g., wheelchair or bedside chair)?: None Help needed to walk in hospital room?: A Little Help needed climbing 3-5 steps with a railing? : A Little 6 Click Score: 22    End of Session Equipment Utilized During Treatment: Gait belt Activity Tolerance: Patient tolerated treatment well Patient left: in bed;with call bell/phone within reach;with family/visitor  present Nurse Communication: Mobility status PT Visit Diagnosis: Other abnormalities of gait and mobility (R26.89);Difficulty in walking, not elsewhere classified (R26.2)    Time: 2423-5361 PT Time Calculation (min) (ACUTE ONLY): 32 min   Charges:   PT Evaluation $PT Eval Low Complexity: 1 Low PT Treatments $Therapeutic Exercise: 8-22 mins $Therapeutic Activity: 8-22 mins        Olga Coaster PT, DPT 2:07 PM,12/02/20

## 2020-12-02 NOTE — Discharge Summary (Signed)
Physician Discharge Summary  Kayla [name] ZOX:[id number] DOB: 10-Sep-1926 DOA: 12/01/2020  PCP: Lynnea Ferrier, MD  Admit date: 12/01/2020 Discharge date: 12/02/2020  Admitted From: Home Disposition: Home with home health  Recommendations for Outpatient Follow-up:  1. Follow up with PCP in 1-2 weeks 2.   Home Health: Yes Equipment/Devices: No Discharge Condition: Stable CODE STATUS: DNR Diet recommendation: Heart healthy/bland Brief/Interim Summary: 87 y.o. Caucasian female with medical history significant for coronary artery disease, peripheral vascular disease, diastolic dysfunction, emphysema, GERD, hypertension, dyslipidemia and hypothyroidism, paroxysmal atrial fibrillation on Eliquis and left bundle branch block, who presented to the ER with acute onset syncope while sitting on the porch.  She apparently slumped over while sitting in her chair and that was a witnessed episode.  She was noted to have nausea and vomiting.  On route to the hospital with EMS she had another syncopal episode lasted less than a minute.  She was having nausea and mid abdominal pain upon arrival to the ER.  She admitted to malaise this afternoon.  No bilious vomitus or hematemesis.  No melena or bright red blood per rectum.  No diarrhea or constipation.  She denies any chest pain or palpitations.  No cough or wheezing hemoptysis.  No dysuria, oliguria or hematuria or flank pain.  No recurrence of syncope.  Patient did have some orthostatic hypotension on evaluation however was asymptomatic.  Presumed etiology of syncopal event was dehydration, nausea and vomiting in the setting of acute gastroenteritis.  No elevated white count or fevers to suggest bacterial etiology.  Suspect viral gastroenteritis.  Recommended patient to resume her prior to arrival medications.  Small amount of as needed metoprolol provided for as needed tachycardia.  Also prescribed ODT Zofran for persistent nausea and vomiting.  Patient  recommended to follow a bland diet for the next several days and ensure adequate hydration.  Discharge Diagnoses:  Active Problems:   Syncope  Syncope Suspect vasovagal in the setting of nausea and vomiting.  Orthostatics were positive however patient remained asymptomatic.  No arrhythmias noted.  Patient tolerating p.o. intake.  Not nauseous or vomiting at time of discharge.  Okay for discharge.  Prescribed ODT Zofran as needed.  Intractable nausea and vomiting Resolved  Hypertensive urgency Poorly controlled hypertension Can resume home regimen at time of discharge.  Outpatient PCP follow-up for medication titration  Paroxysmal atrial fibrillation Rate controlled.  Continue Coreg and Eliquis.  As needed metoprolol prescribed at discharge for as needed symptomatic tachycardia  Discharge Instructions  Discharge Instructions    Diet - low sodium heart healthy   Complete by: As directed    Increase activity slowly   Complete by: As directed      Allergies as of 12/02/2020      Reactions   Bee Pollen    Other reaction(s): Other (See Comments)   Boniva [ibandronic Acid]    Jaw pain   Pollen Extract    Motrin [ibuprofen] Rash      Medication List    TAKE these medications   carvedilol 12.5 MG tablet Commonly known as: COREG TAKE 1 TABLET BY MOUTH  TWICE DAILY What changed: when to take this   chlorthalidone 25 MG tablet Commonly known as: HYGROTON Take 0.5 tablets (12.5 mg total) by mouth every other day.   cloNIDine 0.2 MG tablet Commonly known as: CATAPRES TAKE 1 TABLET BY MOUTH  TWICE DAILY   Eliquis 5 MG Tabs tablet Generic drug: apixaban TAKE 1 TABLET BY MOUTH  TWICE DAILY What changed: how much to take   famotidine 20 MG tablet Commonly known as: PEPCID TAKE 1 TABLET BY MOUTH  TWICE DAILY   hydrALAZINE 50 MG tablet Commonly known as: APRESOLINE Take 1 tablet (50 mg total) by mouth 3 (three) times daily as needed.   levothyroxine 100 MCG  tablet Commonly known as: SYNTHROID Take 100 mcg by mouth every morning.   metoprolol tartrate 25 MG tablet Commonly known as: LOPRESSOR Take 1 tablet (25 mg total) by mouth daily as needed. Take only if you have symptomatic tachycardia with heart rate greater than 120 beats per minute   nitroGLYCERIN 0.4 MG SL tablet Commonly known as: NITROSTAT Place 1 tablet (0.4 mg total) under the tongue every 5 (five) minutes as needed for chest pain.   ondansetron 4 MG disintegrating tablet Commonly known as: Zofran ODT Take 1 tablet (4 mg total) by mouth every 8 (eight) hours as needed for nausea or vomiting.   pravastatin 20 MG tablet Commonly known as: PRAVACHOL TAKE 1 TABLET BY MOUTH AT  BEDTIME What changed: when to take this   valsartan 320 MG tablet Commonly known as: DIOVAN Take 1 tablet (320 mg total) by mouth daily.   VITAMIN B 12 PO Take 3,000 Units by mouth daily.       Allergies  Allergen Reactions  . Bee Pollen     Other reaction(s): Other (See Comments)  . Boniva [Ibandronic Acid]     Jaw pain  . Pollen Extract   . Motrin [Ibuprofen] Rash    Consultations:  None   Procedures/Studies: CT ABDOMEN PELVIS WO CONTRAST  Result Date: 12/01/2020 CLINICAL DATA:  Vomiting altered EXAM: CT ABDOMEN AND PELVIS WITHOUT CONTRAST TECHNIQUE: Multidetector CT imaging of the abdomen and pelvis was performed following the standard protocol without IV contrast. COMPARISON:  CT 03/28/2016 FINDINGS: Lower chest: Lung bases demonstrate scarring or atelectasis at the lingula. No acute consolidation or effusion. Extensive coronary vascular calcification. Borderline cardiomegaly. Moderate hiatal hernia Hepatobiliary: No focal liver abnormality is seen. Status post cholecystectomy. No biliary dilatation. Pancreas: Unremarkable. No pancreatic ductal dilatation or surrounding inflammatory changes. Spleen: Normal in size without focal abnormality. Adrenals/Urinary Tract: Adrenal glands are  normal. Kidneys show no hydronephrosis. Small cyst midpole right kidney. The bladder is unremarkable. Stomach/Bowel: Stomach is within normal limits. Appendix not well seen but no right lower quadrant inflammatory process. No evidence of bowel wall thickening, distention, or inflammatory changes. Moderate stool in the colon. Moderate fecal ball in the rectum. Possible area of colon wall thickening at the distal transverse colon, series 2, image 30. Vascular/Lymphatic: Advanced aortic atherosclerosis with probable stent in the superior mesenteric artery. No aneurysmal dilatation. No suspicious nodes. Reproductive: Uterus and bilateral adnexa are unremarkable. Other: Negative for free air or free fluid Musculoskeletal: No acute or suspicious osseous abnormality. IMPRESSION: 1. No CT evidence for acute intra-abdominal or pelvic abnormality. 2. Moderate hiatal hernia 3. Focal under distension versus mass/wall thickening within the distal transverse colon which may be correlated with colonoscopy if deemed clinically appropriate. No obstructive changes proximal to this area though there is large volume of stool in the colon and rectum suggesting constipation Electronically Signed   By: Jasmine Pang M.D.   On: 12/01/2020 21:43   CT Head Wo Contrast  Result Date: 12/01/2020 CLINICAL DATA:  Mental status change EXAM: CT HEAD WITHOUT CONTRAST TECHNIQUE: Contiguous axial images were obtained from the base of the skull through the vertex without intravenous contrast. COMPARISON:  CT brain  04/10/2019, MRI 04/10/2019 FINDINGS: Brain: No acute territorial infarction, hemorrhage or intracranial mass. Mild atrophy. Moderate chronic small vessel ischemic change of the white matter. Chronic lacunar infarct left basal ganglia. Small chronic infarct right cerebellum. Stable ventricle size Vascular: No hyperdense vessels. Vertebral and carotid vascular calcification Skull: Normal. Negative for fracture or focal lesion.  Sinuses/Orbits: Completely opacified right maxillary sinus. Mucosal thickening in the ethmoid sinuses Other: None IMPRESSION: 1. No CT evidence for acute intracranial abnormality. 2. Atrophy and chronic small vessel ischemic change of the white matter. 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   On: 12/01/2020 21:33   DG Chest Port 1 View  Result Date: 12/01/2020 CLINICAL DATA:  Syncope. EXAM: PORTABLE CHEST 1 VIEW COMPARISON:  October 21, 2019 FINDINGS: Multiple sternal wires and vascular clips are seen. There is no evidence of acute infiltrate, pleural effusion or pneumothorax. The heart size and mediastinal contours are within normal limits. Marked severity calcification of the aortic arch is noted. Surgical clips are seen within the right upper quadrant. Degenerative changes seen throughout the thoracic spine. IMPRESSION: 1. Evidence of prior median sternotomy/CABG. 2. No acute cardiopulmonary disease. Electronically Signed   By: Aram Candela M.D.   On: 12/01/2020 20:57   VAS [place] MESENTERIC DUPLEX  Result Date: 11/26/2020 ABDOMINAL VISCERAL Patient Name:  Kayla [name] [facility]  Date of Exam:   11/23/2020 Medical Rec #: [id number]        Accession #:    [id number] Date of Birth: 08/07/1926        Patient Gender: F Patient Age:   89Y Exam Location:  [facility] Procedure:      VAS [place] MESENTERIC Referring Phys: [id number] Marlow Baars DEW -------------------------------------------------------------------------------- Indications: RAS Vascular Interventions: SMA stent on 01/15/20. Comparison Study: 05/25/2020 Performing Technologist: Debbe Bales RVS  Examination Guidelines: A complete evaluation includes B-mode imaging, spectral Doppler, color Doppler, and power Doppler as needed of all accessible portions of each vessel. Bilateral testing is considered an integral part of a complete examination. Limited examinations for reoccurring  indications may be performed as noted.  Duplex Findings: +----------------------+--------+--------+------+--------+ Mesenteric            PSV cm/sEDV cm/sPlaqueComments +----------------------+--------+--------+------+--------+ Aorta Prox               93      21                  +----------------------+--------+--------+------+--------+ Aorta Mid               107      15                  +----------------------+--------+--------+------+--------+ Aorta Distal             93      27                  +----------------------+--------+--------+------+--------+ Celiac Artery Origin    345      72                  +----------------------+--------+--------+------+--------+ Celiac Artery Proximal  276      35                  +----------------------+--------+--------+------+--------+ SMA Origin              105      18                  +----------------------+--------+--------+------+--------+  SMA Proximal             74      0                   +----------------------+--------+--------+------+--------+ SMA Mid                 111      0                   +----------------------+--------+--------+------+--------+ SMA Distal               81      17                  +----------------------+--------+--------+------+--------+ CHA                      95      12                  +----------------------+--------+--------+------+--------+ Splenic                  99      23                  +----------------------+--------+--------+------+--------+    Summary: Largest Aortic Diameter: 1.7 cm  Mesenteric: Normal Superior Mesenteric artery, Splenic artery and Hepatic artery findings. 70 to 99% stenosis in the celiac artery.  *See table(s) above for measurements and observations.  Diagnosing physician: Festus BarrenJason Dew MD  Electronically signed by Festus BarrenJason Dew MD on 11/26/2020 at 12:28:11 PM.    Final    ECHOCARDIOGRAM COMPLETE  Result Date: 11/18/2020     ECHOCARDIOGRAM REPORT   Patient Name:   Kayla [name] Date of Exam: 11/18/2020 Medical Rec #:  [id number]       Height:       61.0 in Accession #:    [id number]      Weight:       147.0 lb Date of Birth:  01/11/1927       BSA:          1.657 m Patient Age:    89 years        BP:           150/64 mmHg Patient Gender: F               HR:           54 bpm. Exam Location:  [place] Procedure: 2D Echo, Cardiac Doppler and Color Doppler Indications:    R07.9* Chest pain, unspecified; R55 Syncope  History:        Patient has prior history of Echocardiogram examinations, most                 recent 06/17/2019. CHF, CAD, Carotid Disease and Mesenteric                 artery stenosis, Arrythmias:LBBB and Atrial Fibrillation,                 Signs/Symptoms:Chest Pain and Syncope; Risk                 Factors:Hypertension, Dyslipidemia and Non-Smoker.  Sonographer:    Quentin OreGary Joseph RDMS, RVT, RDCS Referring Phys: [id number] Zack SealJACQUELYN D VISSER IMPRESSIONS  1. Left ventricular ejection fraction, by estimation, is 50 to 55%. The left ventricle has low normal function. The left ventricle demonstrates global hypokinesis. There is mild left ventricular hypertrophy. Left ventricular diastolic parameters  are consistent with Grade I diastolic dysfunction (impaired relaxation).  2. Right ventricular systolic function is normal. The right ventricular size is normal. There is mildly elevated pulmonary artery systolic pressure. The estimated right ventricular systolic pressure is 37.7 mmHg.  3. Left atrial size was moderately dilated.  4. The mitral valve is normal in structure. Mild to moderate mitral valve regurgitation.  5. The aortic valve is normal in structure. Aortic valve regurgitation is mild. Mild to moderate aortic valve sclerosis/calcification is present, without any evidence of aortic stenosis. Aortic valve mean gradient measures 7.0 mmHg. FINDINGS  Left Ventricle: Left ventricular ejection fraction, by estimation, is 50 to 55%. The  left ventricle has low normal function. The left ventricle demonstrates global hypokinesis. The left ventricular internal cavity size was normal in size. There is mild left ventricular hypertrophy. Left ventricular diastolic parameters are consistent with Grade I diastolic dysfunction (impaired relaxation). Right Ventricle: The right ventricular size is normal. No increase in right ventricular wall thickness. Right ventricular systolic function is normal. There is mildly elevated pulmonary artery systolic pressure. The tricuspid regurgitant velocity is 2.86  m/s, and with an assumed right atrial pressure of 5 mmHg, the estimated right ventricular systolic pressure is 37.7 mmHg. Left Atrium: Left atrial size was moderately dilated. Right Atrium: Right atrial size was normal in size. Pericardium: There is no evidence of pericardial effusion. Mitral Valve: The mitral valve is normal in structure. Mild to moderate mitral valve regurgitation. No evidence of mitral valve stenosis. Tricuspid Valve: The tricuspid valve is normal in structure. Tricuspid valve regurgitation is mild . No evidence of tricuspid stenosis. Aortic Valve: The aortic valve is normal in structure. Aortic valve regurgitation is mild. Aortic regurgitation PHT measures 594 msec. Mild to moderate aortic valve sclerosis/calcification is present, without any evidence of aortic stenosis. Aortic valve  mean gradient measures 7.0 mmHg. Aortic valve peak gradient measures 12.4 mmHg. Aortic valve area, by VTI measures 1.54 cm. Pulmonic Valve: The pulmonic valve was normal in structure. Pulmonic valve regurgitation is not visualized. No evidence of pulmonic stenosis. Aorta: The aortic root is normal in size and structure. Venous: The inferior vena cava is normal in size with greater than 50% respiratory variability, suggesting right atrial pressure of 3 mmHg. IAS/Shunts: No atrial level shunt detected by color flow Doppler.  LEFT VENTRICLE PLAX 2D LVIDd:          4.30 cm     Diastology LVIDs:         3.30 cm     LV e' medial:    4.68 cm/s LV PW:         1.40 cm     LV E/e' medial:  14.7 LV IVS:        1.50 cm     LV e' lateral:   5.98 cm/s LVOT diam:     2.10 cm     LV E/e' lateral: 11.5 LV SV:         75 LV SV Index:   45 LVOT Area:     3.46 cm  LV Volumes (MOD) LV vol d, MOD A2C: 81.9 ml LV vol d, MOD A4C: 90.3 ml LV vol s, MOD A2C: 42.8 ml LV vol s, MOD A4C: 46.1 ml LV SV MOD A2C:     39.1 ml LV SV MOD A4C:     90.3 ml LV SV MOD BP:      41.1 ml RIGHT VENTRICLE  IVC RV Basal diam:  2.90 cm    IVC diam: 2.60 cm RV S prime:     8.81 cm/s TAPSE (M-mode): 1.9 cm LEFT ATRIUM             Index       RIGHT ATRIUM           Index LA diam:        3.90 cm 2.35 cm/m  RA Area:     13.70 cm LA Vol (A2C):   89.5 ml 54.01 ml/m RA Volume:   30.10 ml  18.16 ml/m LA Vol (A4C):   53.5 ml 32.29 ml/m LA Biplane Vol: 68.9 ml 41.58 ml/m  AORTIC VALVE                    PULMONIC VALVE AV Area (Vmax):    1.65 cm     PV Vmax:       0.40 m/s AV Area (Vmean):   1.57 cm     PV Peak grad:  0.6 mmHg AV Area (VTI):     1.54 cm AV Vmax:           176.00 cm/s AV Vmean:          123.000 cm/s AV VTI:            0.488 m AV Peak Grad:      12.4 mmHg AV Mean Grad:      7.0 mmHg LVOT Vmax:         83.60 cm/s LVOT Vmean:        55.700 cm/s LVOT VTI:          0.217 m LVOT/AV VTI ratio: 0.44 AI PHT:            594 msec  AORTA Ao Root diam: 3.00 cm Ao Asc diam:  2.90 cm Ao Arch diam: 2.4 cm MITRAL VALVE               TRICUSPID VALVE MV Area (PHT): 2.94 cm    TR Peak grad:   32.7 mmHg MV Decel Time: 258 msec    TR Vmax:        286.00 cm/s MV E velocity: 68.60 cm/s MV A velocity: 86.50 cm/s  SHUNTS MV E/A ratio:  0.79        Systemic VTI:  0.22 m                            Systemic Diam: 2.10 cm Julien Nordmann MD Electronically signed by Julien Nordmann MD Signature Date/Time: 11/18/2020/6:16:08 PM    Final    VAS US CAROTID  Result Date: 11/09/2020 Carotid Arterial Duplex Study Patient Name:   Kayla [name] [facility]  Date of Exam:   11/09/2020 Medical Rec #: [id number]        Accession #:    [id number] Date of Birth: 1927-06-30        Patient Gender: F Patient Age:   54Y Exam Location:  [place] Procedure:      VAS US CAROTID Referring Phys: [id number] Antonieta Iba --------------------------------------------------------------------------------  Indications:       One-year follow-up. Other Factors:     Patient denies all cerebrovascular symptoms. Comparison Study:  11/10/19. Carotid duplex exam showed RICA velocities of 244/32                    cm/s and LICA velocities of 89/13 cm/s Performing Technologist: Quentin Ore  RDMS, RVT, RDCS  Examination Guidelines: A complete evaluation includes B-mode imaging, spectral Doppler, color Doppler, and power Doppler as needed of all accessible portions of each vessel. Bilateral testing is considered an integral part of a complete examination. Limited examinations for reoccurring indications may be performed as noted.  Right Carotid Findings: +----------+-------+-------+--------+---------------------------------+--------+           PSV    EDV    StenosisPlaque Description               Comments           cm/s   cm/s                                                     +----------+-------+-------+--------+---------------------------------+--------+ CCA Prox  94                                                              +----------+-------+-------+--------+---------------------------------+--------+ CCA Distal76     13     <50%                                              +----------+-------+-------+--------+---------------------------------+--------+ ICA Prox  196    38     40-59%  heterogenous, irregular and                                               calcific                                  +----------+-------+-------+--------+---------------------------------+--------+ ICA Mid   187    31     1-39%                                              +----------+-------+-------+--------+---------------------------------+--------+ ICA Distal129    25                                                       +----------+-------+-------+--------+---------------------------------+--------+ ECA       191                                                             +----------+-------+-------+--------+---------------------------------+--------+ +----------+--------+-------+----------------+-------------------+           PSV cm/sEDV cmsDescribe        Arm Pressure (mmHG) +----------+--------+-------+----------------+-------------------+ [id number]  Multiphasic, [id number]                 +----------+--------+-------+----------------+-------------------+ +---------+--------+---+--------+--+---------+ VertebralPSV cm/s114EDV cm/s12Antegrade +---------+--------+---+--------+--+---------+  Left Carotid Findings: +----------+--------+--------+------------------+---------------------+--------+           PSV cm/sEDV cm/sStenosis          Plaque Description   Comments +----------+--------+--------+------------------+---------------------+--------+ CCA Prox  80                                                              +----------+--------+--------+------------------+---------------------+--------+ CCA Distal70      10      No plaque         hyperechoic and focal                                   visualized                                      +----------+--------+--------+------------------+---------------------+--------+ ICA Prox  67      17      1-39%             heterogenous and                                                          focal                         +----------+--------+--------+------------------+---------------------+--------+ ICA Mid   71      10      1-39%                                            +----------+--------+--------+------------------+---------------------+--------+ ICA Distal76      13                                                      +----------+--------+--------+------------------+---------------------+--------+ ECA       107     13                                                      +----------+--------+--------+------------------+---------------------+--------+ +----------+--------+--------+----------------+-------------------+           PSV cm/sEDV cm/sDescribe        Arm Pressure (mmHG) +----------+--------+--------+----------------+-------------------+ [id number]             Multiphasic, [id number]                 +----------+--------+--------+----------------+-------------------+ +---------+--------+--+--------+--+---------+ VertebralPSV cm/s57EDV cm/s11Antegrade +---------+--------+--+--------+--+---------+   Summary: Right Carotid: Velocities in the right ICA are consistent with  a 40-59%                stenosis. Non-hemodynamically significant plaque <50% noted in                the CCA. The ECA appears <50% stenosed. Left Carotid: Velocities in the left ICA are consistent with a 1-39% stenosis.               Non-hemodynamically significant plaque <50% noted in the CCA. The               ECA appears <50% stenosed. Vertebrals:  Bilateral vertebral arteries demonstrate antegrade flow. Subclavians: Normal flow hemodynamics were seen in bilateral subclavian              arteries. *See table(s) above for measurements and observations. Suggest follow up study in 12 months. Electronically signed by Charlton Haws MD on 11/09/2020 at 1:47:17 PM.    Final    (Echo, Carotid, EGD, Colonoscopy, ERCP)    Subjective: Seen and examined on the day of discharge.  Stable, no distress.  Stable for discharge home.  Discharge Exam: Vitals:   12/02/20 1230 12/02/20 1253  BP: (!) 124/41 (!) 136/50  Pulse: (!) 55 (!) 55  Resp:  18  Temp:    SpO2: 96% 96%    Vitals:   12/02/20 1130 12/02/20 1200 12/02/20 1230 12/02/20 1253  BP: (!) 136/49 (!) 128/45 (!) 124/41 (!) 136/50  Pulse: (!) 56 (!) 56 (!) 55 (!) 55  Resp:    18  Temp:      TempSrc:      SpO2: 97% 97% 96% 96%  Weight:      Height:        General: Pt is alert, awake, not in acute distress Cardiovascular: RRR, S1/S2 +, no rubs, no gallops Respiratory: CTA bilaterally, no wheezing, no rhonchi Abdominal: Soft, NT, ND, bowel sounds + Extremities: no edema, no cyanosis    The results of significant diagnostics from this hospitalization (including imaging, microbiology, ancillary and laboratory) are listed below for reference.     Microbiology: Recent Results (from the past 240 hour(s))  Resp Panel by RT-PCR (Flu A&B, Covid) Nasopharyngeal Swab     Status: None   Collection Time: 12/01/20 10:13 PM   Specimen: Nasopharyngeal Swab; Nasopharyngeal(NP) swabs in vial transport medium  Result Value Ref Range Status   SARS Coronavirus 2 by RT PCR NEGATIVE NEGATIVE Final    Comment: (NOTE) SARS-CoV-2 target nucleic acids are NOT DETECTED.  The SARS-CoV-2 RNA is generally detectable in upper respiratory specimens during the acute phase of infection. The lowest concentration of SARS-CoV-2 viral copies this assay can detect is 138 copies/mL. A negative result does not preclude SARS-Cov-2 infection and should not be used as the sole basis for treatment or other patient management decisions. A negative result may occur with  improper specimen collection/handling, submission of specimen other than nasopharyngeal swab, presence of viral mutation(s) within the areas targeted by this assay, and inadequate number of viral copies(<138 copies/mL). A negative result must be combined with clinical observations, patient history, and epidemiological information. The expected result is Negative.  Fact Sheet for Patients:  BloggerCourse.com  Fact Sheet for Healthcare  Providers:  SeriousBroker.it  This test is no t yet approved or cleared by the [place] FDA and  has been authorized for detection and/or diagnosis of SARS-CoV-2 by FDA under an Emergency Use Authorization (EUA). This EUA will remain  in effect (meaning this test can be  used) for the duration of the COVID-19 declaration under Section 564(b)(1) of the Act, 21 U.S.C.section 360bbb-3(b)(1), unless the authorization is terminated  or revoked sooner.       Influenza A by PCR NEGATIVE NEGATIVE Final   Influenza B by PCR NEGATIVE NEGATIVE Final    Comment: (NOTE) The Xpert Xpress SARS-CoV-2/FLU/RSV plus assay is intended as an aid in the diagnosis of influenza from Nasopharyngeal swab specimens and should not be used as a sole basis for treatment. Nasal washings and aspirates are unacceptable for Xpert Xpress SARS-CoV-2/FLU/RSV testing.  Fact Sheet for Patients: BloggerCourse.com  Fact Sheet for Healthcare Providers: SeriousBroker.it  This test is not yet approved or cleared by the [place] FDA and has been authorized for detection and/or diagnosis of SARS-CoV-2 by FDA under an Emergency Use Authorization (EUA). This EUA will remain in effect (meaning this test can be used) for the duration of the COVID-19 declaration under Section 564(b)(1) of the Act, 21 U.S.C. section 360bbb-3(b)(1), unless the authorization is terminated or revoked.  Performed at [facility], [address] [place], [place] [address]      Labs: BNP (last 3 results) Recent Labs    10/21/20 1520 10/29/20 1154  BNP 730.8* 102.7*   Basic Metabolic Panel: Recent Labs  Lab 12/01/20 2037 12/02/20 0448  NA 131* 133*  K 3.7 4.0  CL 98 101  CO2 21* 24  GLUCOSE 145* 150*  BUN 25* 20  CREATININE 1.03* 0.89  CALCIUM 8.7* 8.5*  MG 1.9  --    Liver Function Tests: Recent Labs  Lab 12/01/20 2037  AST 16   ALT 10  ALKPHOS 44  BILITOT 0.6  PROT 5.9*  ALBUMIN 3.6   No results for input(s): LIPASE, AMYLASE in the last 168 hours. No results for input(s): AMMONIA in the last 168 hours. CBC: Recent Labs  Lab 12/01/20 2037 12/02/20 0448  WBC 6.4 9.3  NEUTROABS 3.4  --   HGB 11.8* 11.5*  HCT 35.2* 34.1*  MCV 92.1 89.7  PLT 176 166   Cardiac Enzymes: No results for input(s): CKTOTAL, CKMB, CKMBINDEX, TROPONINI in the last 168 hours. BNP: Invalid input(s): POCBNP CBG: Recent Labs  Lab 12/01/20 2120  GLUCAP 129*   D-Dimer No results for input(s): DDIMER in the last 72 hours. Hgb A1c No results for input(s): HGBA1C in the last 72 hours. Lipid Profile No results for input(s): CHOL, HDL, LDLCALC, TRIG, CHOLHDL, LDLDIRECT in the last 72 hours. Thyroid function studies Recent Labs    12/01/20 2037  TSH 1.368   Anemia work up No results for input(s): VITAMINB12, FOLATE, FERRITIN, TIBC, IRON, RETICCTPCT in the last 72 hours. Urinalysis    Component Value Date/Time   COLORURINE YELLOW (A) 12/01/2020 2213   APPEARANCEUR CLEAR (A) 12/01/2020 2213   LABSPEC 1.013 12/01/2020 2213   PHURINE 6.0 12/01/2020 2213   GLUCOSEU NEGATIVE 12/01/2020 2213   GLUCOSEU NEGATIVE 06/29/2017 1043   HGBUR NEGATIVE 12/01/2020 2213   BILIRUBINUR NEGATIVE 12/01/2020 2213   BILIRUBINUR negative 09/21/2017 1439   KETONESUR NEGATIVE 12/01/2020 2213   PROTEINUR NEGATIVE 12/01/2020 2213   UROBILINOGEN 0.2 09/21/2017 1439   UROBILINOGEN 0.2 06/29/2017 1043   NITRITE NEGATIVE 12/01/2020 2213   LEUKOCYTESUR NEGATIVE 12/01/2020 2213   Sepsis Labs Invalid input(s): PROCALCITONIN,  WBC,  LACTICIDVEN Microbiology Recent Results (from the past 240 hour(s))  Resp Panel by RT-PCR (Flu A&B, Covid) Nasopharyngeal Swab     Status: None   Collection Time: 12/01/20 10:13 PM  Specimen: Nasopharyngeal Swab; Nasopharyngeal(NP) swabs in vial transport medium  Result Value Ref Range Status   SARS Coronavirus 2 by  RT PCR NEGATIVE NEGATIVE Final    Comment: (NOTE) SARS-CoV-2 target nucleic acids are NOT DETECTED.  The SARS-CoV-2 RNA is generally detectable in upper respiratory specimens during the acute phase of infection. The lowest concentration of SARS-CoV-2 viral copies this assay can detect is 138 copies/mL. A negative result does not preclude SARS-Cov-2 infection and should not be used as the sole basis for treatment or other patient management decisions. A negative result may occur with  improper specimen collection/handling, submission of specimen other than nasopharyngeal swab, presence of viral mutation(s) within the areas targeted by this assay, and inadequate number of viral copies(<138 copies/mL). A negative result must be combined with clinical observations, patient history, and epidemiological information. The expected result is Negative.  Fact Sheet for Patients:  BloggerCourse.com  Fact Sheet for Healthcare Providers:  SeriousBroker.it  This test is no t yet approved or cleared by the [place] FDA and  has been authorized for detection and/or diagnosis of SARS-CoV-2 by FDA under an Emergency Use Authorization (EUA). This EUA will remain  in effect (meaning this test can be used) for the duration of the COVID-19 declaration under Section 564(b)(1) of the Act, 21 U.S.C.section 360bbb-3(b)(1), unless the authorization is terminated  or revoked sooner.       Influenza A by PCR NEGATIVE NEGATIVE Final   Influenza B by PCR NEGATIVE NEGATIVE Final    Comment: (NOTE) The Xpert Xpress SARS-CoV-2/FLU/RSV plus assay is intended as an aid in the diagnosis of influenza from Nasopharyngeal swab specimens and should not be used as a sole basis for treatment. Nasal washings and aspirates are unacceptable for Xpert Xpress SARS-CoV-2/FLU/RSV testing.  Fact Sheet for Patients: BloggerCourse.com  Fact Sheet  for Healthcare Providers: SeriousBroker.it  This test is not yet approved or cleared by the [place] FDA and has been authorized for detection and/or diagnosis of SARS-CoV-2 by FDA under an Emergency Use Authorization (EUA). This EUA will remain in effect (meaning this test can be used) for the duration of the COVID-19 declaration under Section 564(b)(1) of the Act, 21 U.S.C. section 360bbb-3(b)(1), unless the authorization is terminated or revoked.  Performed at [facility], [address], [place], [place] [address]      Time coordinating discharge: Over 30 minutes  SIGNED:   Tresa Moore, MD  [facility] 12/02/2020, 2:15 PM Pager   If 7PM-7AM, please contact night-coverage

## 2020-12-02 NOTE — ED Notes (Signed)
Pt cleaned of stool at this time

## 2020-12-02 NOTE — ED Notes (Signed)
Pts brief changed of stool at this time. Pt provided with new brief and new puriwck and repositioned in bed at this time. Pt denies any needs.

## 2020-12-02 NOTE — ED Notes (Signed)
Second daughter at bedside. Explained plan of care for discharge today as received in handoff report. Sent msg to Dr Georgeann Oppenheim to confirm if plan is still to discharge today with home health consultation. Also daughter is concerned about pt going in and out of a fib as well as sodium level. Explained sodium level of 133 and also mentioned to attending doctor that there is question of whether pt would be discharged with med for rate control for a fib.

## 2020-12-02 NOTE — Evaluation (Addendum)
Occupational Therapy Evaluation Patient Details Name: Kayla [name] MRN: [id number] DOB: May 29, 1927 Today's Date: 12/02/2020    History of Present Illness Pt is 87 y/o F with PMH: arthritis, CAD, HTN, HDL, paroxysmal A. fib on Eliquis, GERD, emphysema, CHF, left bundle branch block, osteoarthritis and vertigo who presents EMS from home where she had a witnessed syncopal episode.  Per EMS she was sitting on her front porch and family witnessed and started to slump over have some nausea and vomiting. Being w/u for syncope.   Clinical Impression   Pt seen for OT evaluation this date in setting of acute hospitalization d/t syncopal episode. Pt lives in mobile home with ramped entrance with one of her daughter's who she cares for and pt has two other daughters in the area that assist with meal prep and cleaning. In addition, son and grandson in area help with yard work. Pt is otherwise able to perofrm fxl mobility at MOD I with RW and performs all basic self care at MOD I. Pt presents this date with some decreased activity tolerance, but overall appears to be close to her functional baseline. Orthostatics assessed this date during OT session as follows:  L arm supine 194/66 102 78 bpm  L arm sitting 191/85 114 72 bpm  L arm standing 164/65 92 70 bpm  Pt not symptomatic on OT assessment. Able to stand with RW with SBA. Requires increased time to perform LB dressing while seated EOB, but does not require physical assistance from OT. Pt generally appears to be primarily limited 2/2 BP issues at this time and does not have further acute OT needs. Will complete order.    Follow Up Recommendations  No OT follow up;Supervision - Intermittent    Equipment Recommendations  Tub/shower seat    Recommendations for Other Services       Precautions / Restrictions Precautions Precautions: Fall Restrictions Weight Bearing Restrictions: No      Mobility Bed Mobility Overal bed mobility: Modified  Independent                  Transfers Overall transfer level: Needs assistance Equipment used: Rolling walker (2 wheeled) Transfers: Sit to/from Stand Sit to Stand: Supervision         General transfer comment: increased time, appears to be at baseline    Balance Overall balance assessment: Mild deficits observed, not formally tested                                         ADL either performed or assessed with clinical judgement   ADL Overall ADL's : Modified independent;At baseline                                       General ADL Comments: increased time for seated LB dressing to don non-skid slip ons     Vision Patient Visual Report: No change from baseline       Perception     Praxis      Pertinent Vitals/Pain Pain Assessment: No/denies pain     Hand Dominance     Extremity/Trunk Assessment Upper Extremity Assessment Upper Extremity Assessment: Overall WFL for tasks assessed;Generalized weakness (ROM WFL, MMT grossly 4-/5, WFL considering age)   Lower Extremity Assessment Lower Extremity Assessment: Overall WFL for tasks assessed;Generalized weakness (  ROM mostly fxl, some limited hip/knee flexion, limited hip rotation, MMT grossly 4-/5, WFL considering age)       Communication Communication Communication: HOH   Cognition Arousal/Alertness: Awake/alert Behavior During Therapy: WFL for tasks assessed/performed Overall Cognitive Status: Within Functional Limits for tasks assessed                                     General Comments       Exercises Other Exercises Other Exercises: OT ed re: role of OT   Shoulder Instructions      Home Living Family/patient expects to be discharged to:: Private residence (Simultaneous filing. User may not have seen previous data.) Living Arrangements: Children (dtr is mentally handicap per pt's other daughter who is present in the room throughout  assessment) Available Help at Discharge: Family;Available PRN/intermittently (pt has two daughters that live in the area that rotate spending the night with her) Type of Home: Mobile home Home Access: Ramped entrance     Home Layout: One level     Bathroom Shower/Tub: Chief Strategy Officer: Standard     Home Equipment: Environmental consultant - 2 wheels;Wheelchair - manual;Cane - single point;Bedside commode;Shower seat;Grab bars - tub/shower          Prior Functioning/Environment Level of Independence: Needs assistance  Gait / Transfers Assistance Needed: MOD I with fxl mobility with 2WW primarily ADL's / Homemaking Assistance Needed: Pt able to perform all basic self care I'ly. Has family assist for IADLs such as cooking/cleaning/yard work            OT Problem List: Decreased activity tolerance;Cardiopulmonary status limiting activity      OT Treatment/Interventions: Self-care/ADL training;Therapeutic activities    OT Goals(Current goals can be found in the care plan section) Acute Rehab OT Goals Patient Stated Goal: to figure out what's causing me to get dizzy and go home OT Goal Formulation: All assessment and education completed, d/c therapy   OT Frequency:    Barriers to D/C:            Co-evaluation              AM-PAC OT "6 Clicks" Daily Activity     Outcome Measure Help from another person eating meals?: None Help from another person taking care of personal grooming?: None Help from another person toileting, which includes using toliet, bedpan, or urinal?: None Help from another person bathing (including washing, rinsing, drying)?: A Little (SUPV considering wet surface) Help from another person to put on and taking off regular upper body clothing?: None Help from another person to put on and taking off regular lower body clothing?: None 6 Click Score: 23   End of Session Equipment Utilized During Treatment: Gait belt;Rolling walker Nurse  Communication: Mobility status;Other (comment) (orthostatic)  Activity Tolerance: Patient tolerated treatment well Patient left: in bed;with call bell/phone within reach  OT Visit Diagnosis: Muscle weakness (generalized) (M62.81)                Time: 1610-9604 OT Time Calculation (min): 32 min Charges:  OT General Charges $OT Visit: 1 Visit OT Evaluation $OT Eval Moderate Complexity: 1 Mod OT Treatments $Self Care/Home Management : 8-22 mins $Therapeutic Activity: 8-22 mins  Rejeana Brock, MS, OTR/L ascom [phone] 12/02/20, 12:40 PM

## 2020-12-02 NOTE — ED Notes (Signed)
Caseworker and attending physician on way to see pt face to face soon.

## 2020-12-02 NOTE — ED Notes (Signed)
Pt resting in bed at this time, VSS. Pt c/o throat pain from vomiting, this rn offered meds to sooth throat and pt declined. Family at bedside

## 2020-12-14 ENCOUNTER — Telehealth: Payer: Self-pay | Admitting: Adult Health Nurse Practitioner

## 2020-12-14 NOTE — Telephone Encounter (Signed)
Spoke with patient's daughter, Aris Everts, regarding the Palliative referral/services and all questions were answered and she was in agreement with beginning services.  I have scheduled an In-home Consult for 12/16/20 @ 12:30 PM.

## 2020-12-16 ENCOUNTER — Other Ambulatory Visit: Payer: Self-pay | Admitting: Adult Health Nurse Practitioner

## 2020-12-16 ENCOUNTER — Encounter: Payer: Self-pay | Admitting: Adult Health Nurse Practitioner

## 2020-12-16 ENCOUNTER — Other Ambulatory Visit: Payer: Self-pay

## 2020-12-16 VITALS — BP 174/72 | HR 60 | Wt 145.0 lb

## 2020-12-16 DIAGNOSIS — R5381 Other malaise: Secondary | ICD-10-CM

## 2020-12-16 DIAGNOSIS — R634 Abnormal weight loss: Secondary | ICD-10-CM

## 2020-12-16 DIAGNOSIS — I5189 Other ill-defined heart diseases: Secondary | ICD-10-CM

## 2020-12-16 DIAGNOSIS — M170 Bilateral primary osteoarthritis of knee: Secondary | ICD-10-CM

## 2020-12-16 NOTE — Progress Notes (Signed)
[facility] Consult Note Telephone: [phone]  Fax: [phone]    Date of encounter: 12/16/20 PATIENT NAME: Kayla [name] [address] [address]   [phone] (home)  DOB: May 26, 1927 MRN: [id number] PRIMARY CARE PROVIDER:    Adin Hector, MD,  [place] [address] [phone]  REFERRING PROVIDER:   Adin Hector, MD [facility] [place],  [place] [address] [phone]  RESPONSIBLE PARTY:    Contact Information    Name Relation Home Work Mobile   Shropshire,Brenda Daughter [phone]   Jessamyn, Watterson [phone]     Doylene Canning Daughter [phone]  [phone]       I met face to face with patient and family in home. Palliative Care was asked to follow this patient by consultation request of  Adin Hector, MD to address advance care planning and complex medical decision making. This is the initial visit.   Daughters and sister present during visit today                                    ASSESSMENT AND PLAN / RECOMMENDATIONS:   Advance Care Planning/Goals of Care: Goals include to maximize quality of life and symptom management.  Patient voices that she would like to pass naturally instead of having CPR performed.  She also voices that she does not want to go back and forth to the hospital.  Filled out DNR, uploaded toVynca, and left original with patient.  Left blank MOST form for patient and family to go over together  CODE STATUS: DNR  Symptom Management/Plan:  Diastolic dysfunction: Patient is on chlorthalidone 25 mg half a tab daily.  She is also on Coreg 12 and half milligrams twice daily, clonidine 0.2 mg twice daily, Eliquis 5 mg twice daily, and takes hydralazine 50 mg 3 times daily as needed for SBP greater than 160.  Continue follow-up and recommendations by  cardiology  Weight Loss: Have encouraged supplementation such as Ensure or boost on days that she is not eating as much.  We will continue to monitor weight for any continued weight loss  Arthritis/debility:  Patient is having difficulty getting in and out of a regular bed and has been sleeping in recliner.  Due to her arthritis pain patient would benefit with a hospital bed to easier to get in and out of bed and to help with repositioning due to the pain and stiffness.  With help of clinical navigator will order hospital bed for patient.  We will also order home health PT to to evaluate and treat for deconditioning related to her arthritis.  Did discuss with patient and family today options of pursuing treatments versus hospice.  The patient states feeling tired she wants to pursue physical therapy to see if this helps with her weakness.  Follow up Palliative Care Visit: Palliative care will continue to follow for complex medical decision making, advance care planning, and clarification of goals. Return 4 weeks or prn. Encouraged to call with any questions or concerns  I spent 90 minutes providing this consultation. More than 50% of the time in this consultation was spent in counseling and care coordination.  PPS: 30%  HOSPICE ELIGIBILITY/DIAGNOSIS: TBD  Chief Complaint: initial palliative visit  HISTORY OF PRESENT ILLNESS:  Kayla [name] is a 87 y.o.  year old female  with CAD, HTN, PVD, mesenteric ischemia, hypothyroidism, HLD, a-fib, CKD stage 3b, diastolic dysfunction, arthritis, h/o CVA.  Patient lives at home with her sister.  She was seen in ED for syncope on 12/01/2020.  CT of head, chest x-ray, and CT of abdomen/pelvis were unremarkable for any acute findings.  Patient does have labile blood pressures and is being followed by cardiology.  She does have hydralazine 50 mg 3 times daily as needed when SBP is over 160.  She has not had any more syncopal episodes.  Over the past 3 to 4 months  patient has gotten increasingly weaker.  She used to ambulate with walker and now requires assistance to stand.  Over the past 2 to 3 weeks patient has not been able to walk without assistance even with her walker.  She has had 2 falls over the past 4 months.  She does complain of dizziness with position changes at times.  Daughter states that for the past few months she has been sleeping in her recliner is due to her arthritis pain in her knees she is having difficulty getting in and out of her bed.  Patient does require assistance with ADLs.  Family has purchased a lift chair which is being delivered today.  Patient has just finished antibiotic for UTI and has no complaints of dysuria or hematuria today.  Patient states that her appetite comes and goes.  She has had a 13 pound weight loss over the past 2 months.  On 10/18/2020 she weighed 158.4 pounds with BMI of 29.93 and currently weighs 145 pounds with BMI of 27.4.  She is trying to supplement with Ensure or boost on days she does not eat much.  She does have occasional constipation and will take Colace, MiraLAX, and prunes when needed with relief.  Rest of 10 point ROS asked and negative except what is stated in HPI.  History obtained from review of EMR and interview with family and [name].  I reviewed available labs, medications, imaging, studies and related documents from the EMR.  Records reviewed and summarized above.   Physical Exam:  Constitutional: NAD General: frail appearing EYES: anicteric sclera, lids intact, no discharge  ENMT: intact hearing, oral mucous membranes moist CV: S1S2, RRR, nonpitting LE edema Pulmonary: LCTA, no increased work of breathing, no cough Abdomen: normo-active BS + 4 quadrants, soft and non tender MSK:  Ambulatory with walker and with stand by assist Skin: warm and dry, no rashes on visible skin Neuro: A and O x 3 Hem/lymph/immuno: no widespread bruising   CURRENT PROBLEM LIST:  Patient Active Problem  List   Diagnosis Date Noted  . Pain in limb 05/25/2020  . Diastolic dysfunction 04/19/2020  . Paroxysmal atrial fibrillation (HCC) 04/19/2020  . Mesenteric ischemia (HCC) 03/12/2020  . CAD (coronary artery disease)   . Syncope 12/24/2019  . Atrial fibrillation (HCC) 11/26/2019  . Osteoarthritis of knee 10/24/2019  . Low grade fever 10/20/2019  . Chest pain 03/21/2019  . Osteoporosis 08/27/2017  . Carotid stenosis 08/08/2016  . Chronic kidney disease 08/08/2016  . Hypothyroidism 08/08/2016  . Hypertension 01/09/2013  . Essential hypertension 01/09/2013  . Hyperlipidemia 07/07/2011  . GERD (gastroesophageal reflux disease) 06/10/2010  . CAD (coronary artery disease), autologous vein bypass graft 04/15/2009   PAST MEDICAL HISTORY:  Active Ambulatory Problems    Diagnosis Date Noted  . Hypertension 01/09/2013  . Chest pain 03/21/2019  . Low grade fever 10/20/2019  . Atrial fibrillation (HCC)   11/26/2019  . Syncope 12/24/2019  . Osteoarthritis of knee 10/24/2019  . Mesenteric ischemia (HCC) 03/12/2020  . CAD (coronary artery disease)   . Diastolic dysfunction 04/19/2020  . Paroxysmal atrial fibrillation (HCC) 04/19/2020  . Carotid stenosis 08/08/2016  . Chronic kidney disease 08/08/2016  . CAD (coronary artery disease), autologous vein bypass graft 04/15/2009  . GERD (gastroesophageal reflux disease) 06/10/2010  . Essential hypertension 01/09/2013  . Hypothyroidism 08/08/2016  . Hyperlipidemia 07/07/2011  . Osteoporosis 08/27/2017  . Pain in limb 05/25/2020   Resolved Ambulatory Problems    Diagnosis Date Noted  . CAD, AUTOLOGOUS BYPASS GRAFT 04/15/2009  . LBBB (left bundle branch block) 04/15/2009  . MURMUR 06/10/2010  . GERD (gastroesophageal reflux disease) 06/10/2010  . Mixed hyperlipidemia 07/07/2011  . Aortic valve stenosis 04/12/2015  . Leg swelling 04/12/2015  . Bilateral leg edema 10/11/2015  . Carotid stenosis 08/08/2016  . Chronic kidney disease 08/08/2016   . Hypothyroidism 08/08/2016  . Depression 08/08/2016  . Chronic pain of right knee 08/09/2016  . Screening for breast cancer 08/09/2016  . Hyponatremia 09/21/2016  . Osteoporosis 08/27/2017  . Dysuria 09/17/2017  . Urinary frequency 09/23/2017  . Bursitis of hip 03/07/2016  . Hematuria 12/24/2017  . Skin lesion 12/24/2017  . Impacted cerumen of left ear 12/24/2017  . Allergic rhinitis 12/24/2017  . Osteopenia 04/03/2018  . Neuropathy 04/03/2018  . Urinary incontinence 04/03/2018  . Fatigue 07/29/2018  . Arthralgia 08/26/2018  . 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 Arthritis   . Bundle branch block, left   . Carotid arterial disease (HCC)   . Cholecystitis   . Emphysema lung (HCC)   . Heart murmur   . HOH (hard of hearing)   . Orthostatic headache   . PAF (paroxysmal atrial fibrillation) (HCC)   . Pain   . Valvular heart disease   . Vertigo    SOCIAL HX:  Social History   Tobacco Use  . Smoking status: Never Smoker  . Smokeless tobacco: Never Used  Substance Use Topics  . Alcohol use: No   FAMILY HX:  Family History  Problem Relation Age of Onset  . Heart failure Mother 76  . Heart failure Sister 48  . Heart attack Sister   . Heart attack Brother 42  . Breast cancer Neg Hx       ALLERGIES:  Allergies  Allergen Reactions  . Bee Pollen     Other reaction(s): Other (See Comments)  . Boniva [Ibandronic Acid]     Jaw pain  . Pollen Extract   . Motrin [Ibuprofen] Rash     PERTINENT MEDICATIONS:  Outpatient Encounter Medications as of 12/16/2020  Medication Sig  . carvedilol (COREG) 12.5 MG tablet TAKE 1 TABLET BY MOUTH  TWICE DAILY (Patient taking differently: Take 12.5 mg by mouth 2 (two) times daily with a meal.)  . chlorthalidone (HYGROTON) 25 MG tablet Take 0.5 tablets (12.5 mg total) by mouth every other day.  . cloNIDine (CATAPRES) 0.2 MG tablet TAKE 1 TABLET BY MOUTH  TWICE DAILY (Patient  taking differently: Take 0.2 mg by mouth 2 (two) times daily.)  . Cyanocobalamin (VITAMIN B 12 PO) Take 3,000 Units by mouth daily.  . ELIQUIS 5 MG TABS tablet TAKE 1 TABLET BY MOUTH  TWICE DAILY (Patient taking differently: Take 5 mg by mouth 2 (two) times daily.)  . famotidine (PEPCID) 20 MG tablet TAKE 1 TABLET BY MOUTH  TWICE DAILY (Patient   taking differently: Take 20 mg by mouth 2 (two) times daily.)  . hydrALAZINE (APRESOLINE) 50 MG tablet Take 1 tablet (50 mg total) by mouth 3 (three) times daily as needed.  . levothyroxine (SYNTHROID) 100 MCG tablet Take 100 mcg by mouth every morning.  . metoprolol tartrate (LOPRESSOR) 25 MG tablet Take 1 tablet (25 mg total) by mouth daily as needed. Take only if you have symptomatic tachycardia with heart rate greater than 120 beats per minute  . nitroGLYCERIN (NITROSTAT) 0.4 MG SL tablet Place 1 tablet (0.4 mg total) under the tongue every 5 (five) minutes as needed for chest pain.  . ondansetron (ZOFRAN ODT) 4 MG disintegrating tablet Take 1 tablet (4 mg total) by mouth every 8 (eight) hours as needed for nausea or vomiting.  . pravastatin (PRAVACHOL) 20 MG tablet TAKE 1 TABLET BY MOUTH AT  BEDTIME (Patient taking differently: Take 20 mg by mouth daily.)  . valsartan (DIOVAN) 320 MG tablet Take 1 tablet (320 mg total) by mouth daily.   No facility-administered encounter medications on file as of 12/16/2020.    Thank you for the opportunity to participate in the care of Ms. Hadlock.  The palliative care team will continue to follow. Please call our office at [phone] if we can be of additional assistance.   Amy K Daniels, NP , DNP  This chart was dictated using voice recognition software. Despite best efforts to proofread, errors can occur which can change the documentation meaning.   COVID-19 PATIENT SCREENING TOOL Asked and negative response unless otherwise noted:   Have you had symptoms of covid, tested positive or been in contact with  someone with symptoms/positive test in the past 5-10 days? negative  

## 2021-01-04 ENCOUNTER — Telehealth: Payer: Self-pay | Admitting: Adult Health Nurse Practitioner

## 2021-01-04 NOTE — Telephone Encounter (Signed)
Called to change time of next week's appointment.  Left VM with reason for call and call back info Nichols Corter K. Garner Nash NP

## 2021-01-06 ENCOUNTER — Telehealth: Payer: Self-pay | Admitting: Cardiovascular Disease

## 2021-01-06 DIAGNOSIS — I10 Essential (primary) hypertension: Secondary | ICD-10-CM

## 2021-01-06 NOTE — Telephone Encounter (Signed)
Spoke with the patient.  Patient sts that she has noticed that since she was started in Eliquis earlier this year it has been difficult to control her BP. She also complains of being cold all the time.  Confirmed the she is taking all of her anti-hypertensive medications listed. She last checked her BP yesterday and her systolic BP was in the 200's. She is asymptomatic.  She has taken her morning medications this morning (including chlorthalidone which she alternates). She has been taking her prn hydralazine but only once a day.  I asked the patient to recheck her BP while I held the line. 205/78 60 bpm. Add the patient to take her prn 50mg  hydralazine now and to recheck her bp in 2-3 hours. Reiterated that she should take the hydralazine 50 mg tid prn if her systolic is >160. Adv her that I will call back this afternoon for her to provide and update on her BP.

## 2021-01-06 NOTE — Telephone Encounter (Signed)
Pt c/o BP issue: STAT if pt c/o blurred vision, one-sided weakness or slurred speech  1. What are your last 5 BP readings? 209-212 systolic  patient this is the trend for a "while"   2. Are you having any other symptoms (ex. Dizziness, headache, blurred vision, passed out)? Headache intermittent denies other symptoms   3. What is your BP issue? Too high since starting eliquis and wants to stop taking.  Patient c/o chills and htn since starting Eliquis.

## 2021-01-07 NOTE — Telephone Encounter (Signed)
Called this morning to check on the patient. Patient sts that after taking her hydralazine 50mg  around 1 pm she rechecked her BP at 2:15pm  and her systolic was down to 160.  BP @ 4:15pm 182/60, she then took another hydralazine to total 2 tabs for the day.  She has not taken her medications or checked her BP this morning. Instructed the patient to use her prn hydralazine tid if needed.  I will fwd the update to Dr. for further instructions.  Patient verbalized understanding and voiced appreciation for the call.

## 2021-01-08 ENCOUNTER — Other Ambulatory Visit: Payer: Self-pay | Admitting: Cardiovascular Disease

## 2021-01-08 DIAGNOSIS — I480 Paroxysmal atrial fibrillation: Secondary | ICD-10-CM

## 2021-01-10 MED ORDER — METOPROLOL TARTRATE 25 MG PO TABS: 25.0000 mg | ORAL_TABLET | Freq: Every day | ORAL | 3 refills | Status: DC | PRN

## 2021-01-10 MED ORDER — CLONIDINE HCL 0.2 MG PO TABS: 0.2000 mg | ORAL_TABLET | Freq: Three times a day (TID) | ORAL | 3 refills | Status: DC

## 2021-01-10 MED ORDER — VALSARTAN 320 MG PO TABS: 320.0000 mg | ORAL_TABLET | Freq: Every day | ORAL | 3 refills | Status: AC

## 2021-01-10 NOTE — Telephone Encounter (Signed)
72F, 66.7KG, SCR 0.88 12/04/20. LOVW/GOLLAN 11/26/20

## 2021-01-10 NOTE — Telephone Encounter (Signed)
Was able to reach back out to Mrs. Almquist regarding her HTN, she had spoke with Misty Stanley, RN last week and Dr. Mariah Milling was able to give advise based on her BP and Eliquis  "Blood pressure was better controlled in 2021 when she was on amlodipine 10 mg daily as well as her other medications  Amlodipine was held for leg swelling November 2021  Recent high blood pressures likely have nothing to do with the Eliquis just the blood pressure medication changes  We will have to work with her other medications and we will look to avoid amlodipine and other medications like that given her prior swelling  I would increase the clonidine up to 0.2 mg every 8 every 8 hours every 8 hours ready breakfast, mid afternoon and before bed  Continue the other medications as well  If she does decide to stop Eliquis she would need Xarelto as she is high risk for stroke given paroxysmal atrial fibrillation  Tgollan"  Mrs. Kot verbalized understanding to increase her Clonidine 0.2 mg up to TID, advised this should help provide coverage for her HTN in the afternoon.  Educated Kayla [name] that Eliquis typically does not cause HTN, it is used to thin the blood to prevent clot formation to reduce risk of strokes/MI. She reports understanding, confirms taking all of her anti-hypertensive medications listed, afternoon her BP has been SBP 160-180s occasionally.   Advised to start the clonidine TID as discussed, continue to monitor BP and call back with anything further. Pt agrees with plan.

## 2021-01-12 ENCOUNTER — Encounter: Payer: Self-pay | Admitting: Adult Health Nurse Practitioner

## 2021-01-12 ENCOUNTER — Other Ambulatory Visit: Payer: Medicare Other | Admitting: Adult Health Nurse Practitioner

## 2021-01-12 ENCOUNTER — Other Ambulatory Visit: Payer: Self-pay

## 2021-01-12 VITALS — BP 188/70 | HR 58

## 2021-01-12 DIAGNOSIS — I5189 Other ill-defined heart diseases: Secondary | ICD-10-CM

## 2021-01-12 DIAGNOSIS — R5381 Other malaise: Secondary | ICD-10-CM

## 2021-01-12 DIAGNOSIS — I10 Essential (primary) hypertension: Secondary | ICD-10-CM

## 2021-01-12 DIAGNOSIS — Z515 Encounter for palliative care: Secondary | ICD-10-CM

## 2021-01-12 NOTE — Progress Notes (Signed)
[facility] Consult Note Telephone: [phone]  Fax: [phone]    Date of encounter: 01/12/21 PATIENT NAME: Kayla [name] [address] [address]   [phone] (home)  DOB: Apr 30, 1927 MRN: [id number] PRIMARY CARE PROVIDER:    Adin Hector, MD,  [place] [address] [phone]  REFERRING PROVIDER:   Adin Hector, MD [facility] [place],  [place] [address] [phone]  RESPONSIBLE PARTY:    Contact Information     Name Relation Home Work Mobile   Shropshire,Brenda Daughter [phone]   Emya, Picado [phone]     Doylene Canning Daughter [phone]  [phone]        I met face to face with patient and family in home. Palliative Care was asked to follow this patient by consultation request of  Adin Hector, MD to address advance care planning and complex medical decision making. This is a follow up visit.  Daughters and sister present during visit today                                   ASSESSMENT AND PLAN / RECOMMENDATIONS:   Advance Care Planning/Goals of Care: Goals include to maximize quality of life and symptom management.  CODE STATUS: DNR  Symptom Management/Plan:  CHF: This seems stable at this time with no increased edema or shortness of breath.  Continue follow-up and recommendations by cardiology  HTN: Patient has had recent increase in clonidine from twice daily to 3 times daily.  This increase started about 2 days ago and she is still having elevated blood pressure readings.  Continue blood pressure readings in the home.  Has reevaluation by PCP next week  Debility: Patient continues to have difficulty getting in and out of her bed due to her arthritis.  Order for hospital bed has been put in but they have not received a call about this.  Patient can only go short  distances before getting winded and has weakness after recent hospitalization for syncope.  Family has not heard anything about PT services being started at this time.  Will reach out to clinical navigator for status on these orders.   Follow up Palliative Care Visit: Palliative care will continue to follow for complex medical decision making, advance care planning, and clarification of goals. Return 8 weeks or prn. Encouraged to call with any questions or concerns  I spent 45 minutes providing this consultation. More than 50% of the time in this consultation was spent in counseling and care coordination.  PPS: 40%  HOSPICE ELIGIBILITY/DIAGNOSIS: TBD  Chief Complaint: follow up palliative visit  HISTORY OF PRESENT ILLNESS:  Kayla [name] is a 87 y.o. year old female  with CAD, HTN, PVD, mesenteric ischemia, hypothyroidism, HLD, a-fib, CKD stage 3b, diastolic dysfunction, arthritis, h/o CVA .  Patient has been having elevated blood pressure readings with SBP around the 180s and 190s.  Her clonidine has been increased from twice daily to 3 times daily 2 days ago.  Her blood pressure readings are still elevated and she does have the evaluation with PCP in 1 week.  Does state that she gets a headache once in a while and family does state that she complains of dizziness every now and then especially with position changes.  Patient denies pain today.  Family does  state that due to her arthritis that she has difficulty getting in and out of her bed and has been sleeping in a recliner.  She has not been sleeping well in her recliner.  Patient has been able to get up on her own and walk with walker short distances but does get winded easily.  States that she does get most out of breath when bathing.  She does require help with ADLs except feeding.  States that her appetite has improved and she is eating better.  States having occasional constipation relieved with MiraLAX.  Rest of 10 point ROS asked and  negative except what is stated in HPI  History obtained from review of EMR and interview with family and Kayla [name].   Physical Exam:   Constitutional: NAD General: frail appearing EYES: anicteric sclera, lids intact, no discharge ENMT: intact hearing, oral mucous membranes moist CV: S1S2, RRR, nonpitting LE edema Pulmonary: LCTA, no increased work of breathing, no cough Abdomen: normo-active BS + 4 quadrants, soft and non tender MSK:  Ambulatory with walker and with stand by assist Skin: warm and dry, no rashes on visible skin Neuro: A and O x 3 Hem/lymph/immuno: no widespread bruising   Thank you for the opportunity to participate in the care of Kayla [name].  The palliative care team will continue to follow. Please call our office at [phone] if we can be of additional assistance.   Amy K Daniels, NP , DNP  This chart was dictated using voice recognition software.  Despite best efforts to proofread,  errors can occur which can change the documentation meaning.   COVID-19 PATIENT SCREENING TOOL Asked and negative response unless otherwise noted:   Have you had symptoms of covid, tested positive or been in contact with someone with symptoms/positive test in the past 5-10 days?   

## 2021-01-23 ENCOUNTER — Emergency Department
Admission: EM | Admit: 2021-01-23 | Discharge: 2021-01-23 | Disposition: A | Discharge status: Home / Self Care | Payer: Medicare Other | Attending: Emergency Medicine | Admitting: Emergency Medicine

## 2021-01-23 ENCOUNTER — Other Ambulatory Visit: Payer: Self-pay

## 2021-01-23 ENCOUNTER — Emergency Department: Payer: Medicare Other

## 2021-01-23 DIAGNOSIS — R1032 Left lower quadrant pain: Secondary | ICD-10-CM | POA: Diagnosis present

## 2021-01-23 DIAGNOSIS — Z951 Presence of aortocoronary bypass graft: Secondary | ICD-10-CM | POA: Diagnosis not present

## 2021-01-23 DIAGNOSIS — I129 Hypertensive chronic kidney disease with stage 1 through stage 4 chronic kidney disease, or unspecified chronic kidney disease: Secondary | ICD-10-CM | POA: Insufficient documentation

## 2021-01-23 DIAGNOSIS — E039 Hypothyroidism, unspecified: Secondary | ICD-10-CM | POA: Insufficient documentation

## 2021-01-23 DIAGNOSIS — Z7901 Long term (current) use of anticoagulants: Secondary | ICD-10-CM | POA: Insufficient documentation

## 2021-01-23 DIAGNOSIS — I2510 Atherosclerotic heart disease of native coronary artery without angina pectoris: Secondary | ICD-10-CM | POA: Insufficient documentation

## 2021-01-23 DIAGNOSIS — N189 Chronic kidney disease, unspecified: Secondary | ICD-10-CM | POA: Insufficient documentation

## 2021-01-23 DIAGNOSIS — R197 Diarrhea, unspecified: Secondary | ICD-10-CM | POA: Insufficient documentation

## 2021-01-23 DIAGNOSIS — Z79899 Other long term (current) drug therapy: Secondary | ICD-10-CM | POA: Insufficient documentation

## 2021-01-23 DIAGNOSIS — R109 Unspecified abdominal pain: Secondary | ICD-10-CM

## 2021-01-23 LAB — CBC
HCT: 36.4 % (ref 36.0–46.0)
Hemoglobin: 12.4 g/dL (ref 12.0–15.0)
MCH: 30.8 pg (ref 26.0–34.0)
MCHC: 34.1 g/dL (ref 30.0–36.0)
MCV: 90.3 fL (ref 80.0–100.0)
Platelets: 178 10*3/uL (ref 150–400)
RBC: 4.03 MIL/uL (ref 3.87–5.11)
RDW: 13.9 % (ref 11.5–15.5)
WBC: 6.6 10*3/uL (ref 4.0–10.5)
nRBC: 0 % (ref 0.0–0.2)

## 2021-01-23 LAB — COMPREHENSIVE METABOLIC PANEL
ALT: 9 U/L (ref 0–44)
AST: 14 U/L — ABNORMAL LOW (ref 15–41)
Albumin: 3.7 g/dL (ref 3.5–5.0)
Alkaline Phosphatase: 42 U/L (ref 38–126)
Anion gap: 8 (ref 5–15)
BUN: 19 mg/dL (ref 8–23)
CO2: 24 mmol/L (ref 22–32)
Calcium: 8.9 mg/dL (ref 8.9–10.3)
Chloride: 98 mmol/L (ref 98–111)
Creatinine, Ser: 0.93 mg/dL (ref 0.44–1.00)
GFR, Estimated: 58 mL/min — ABNORMAL LOW (ref >60)
Glucose, Bld: 117 mg/dL — ABNORMAL HIGH (ref 70–99)
Potassium: 4.4 mmol/L (ref 3.5–5.1)
Sodium: 130 mmol/L — ABNORMAL LOW (ref 135–145)
Total Bilirubin: 0.9 mg/dL (ref 0.3–1.2)
Total Protein: 6.4 g/dL — ABNORMAL LOW (ref 6.5–8.1)

## 2021-01-23 LAB — URINALYSIS, COMPLETE (UACMP) WITH MICROSCOPIC
Bacteria, UA: NONE SEEN
Bilirubin Urine: NEGATIVE
Glucose, UA: NEGATIVE mg/dL
Hgb urine dipstick: NEGATIVE
Ketones, ur: NEGATIVE mg/dL
Leukocytes,Ua: NEGATIVE
Nitrite: NEGATIVE
Protein, ur: NEGATIVE mg/dL
Specific Gravity, Urine: 1.015 (ref 1.005–1.030)
pH: 7 (ref 5.0–8.0)

## 2021-01-23 LAB — LIPASE, BLOOD: Lipase: 22 U/L (ref 11–51)

## 2021-01-23 MED ORDER — IOHEXOL 300 MG/ML  SOLN
75.0000 mL | Freq: Once | INTRAMUSCULAR | Status: AC | PRN
  Administered 2021-01-23: 75 mL via INTRAVENOUS

## 2021-01-23 MED ORDER — CLONIDINE HCL 0.1 MG PO TABS
0.2000 mg | ORAL_TABLET | Freq: Once | ORAL | Status: AC
  Administered 2021-01-23: 0.2 mg via ORAL
  Filled 2021-01-23: qty 2

## 2021-01-23 NOTE — Discharge Instructions (Addendum)
Lab work urine and CTs done today do not show a cause for your abdominal pain.  Please continue to follow-up with your doctors as planned this coming week and return for increasing pain fever or vomiting or feeling sicker.

## 2021-01-23 NOTE — Progress Notes (Signed)
Office Visit    Patient Name: Kayla [name] Date of Encounter: 01/24/2021  PCP:  Lynnea Ferrier, MD   [facility]  Cardiologist:  Julien Nordmann, MD  Advanced Practice Provider:  No care team member to display Electrophysiologist:  None  :[id number]}   Chief Complaint    Chief Complaint  Patient presents with   Follow-up    F/U after echo     87 year old female with history of CAD s/p CABG in 2001 ([facility]), left bundle branch block, hypertension, hyperlipidemia, diastolic dysfunction, mild to moderate valvular heart disease, carotid arterial disease, superior mesenteric artery stenosis/mesenteric ischemia, SVT, PVCs, PACs, paroxysmal atrial fibrillation, and seen today for symptomatic elevated blood pressure with recent trip to the emergency department after changes in medications by PCP.  Past Medical History    Past Medical History:  Diagnosis Date   Arthritis    Bundle branch block, left    Chronic   CAD (coronary artery disease)    a. 2001 s/p CABG @ [facility]; b. 2009 Neg MV; c. 12/2019 MV: EF 52%, no ischemia.   Carotid arterial disease (HCC)    a. 10/2019 Carotid U/S: RICA 40-59, LICA 1-39. F/u 2 yrs.   Cholecystitis    from record   Diastolic dysfunction    a. 06/2019 Echo: EF 55-60%, Gr2 DD.   Emphysema lung (HCC)    GERD (gastroesophageal reflux disease)    Heart murmur    HOH (hard of hearing)    Hyperlipidemia    Hypertension    Hypothyroidism    Orthostatic headache    Mild   PAF (paroxysmal atrial fibrillation) (HCC)    a. Dx 10/2019 - converted spontaneously to sinus ED; b. CHA2DS2VASc = 5-->Eliquis.   Pain    HIP AND LEG PAIN USES  CANE   Superior mesenteric artery stenosis (HCC)    a. 03/2019 renal duplex w/ incidental finding of celia and SMA stenosis; b. 09/2019 Mesenteric Duplex: Nl Celiac, Hepatic, and Splenic arteries. 70-99% SMA stenosis-->Seen by vasc surgery; c. 01/2020 SMA 75-80%(7x26 balloon expandable stent). Celiac  80-90%.   Valvular heart disease    a. 06/2019 Echo: EF 55-60%, Gr2 DD. No rwma. Nl RV fxn. Mild-mod MR. Mild TR/AI/PR. Mild to mod Ao sclerosis w/o stenosis.   Vertigo    Past Surgical History:  Procedure Laterality Date   APPENDECTOMY  1957   BREAST BIOPSY Left 2011   neg, stereo done by Dr. Evette Cristal   BREAST BIOPSY     CARDIAC CATHETERIZATION  2000   [facility]   CATARACT EXTRACTION Right    right 2004, left 2018    CATARACT EXTRACTION W/PHACO Left 04/12/2017   Procedure: CATARACT EXTRACTION PHACO AND INTRAOCULAR LENS PLACEMENT (IOC);  Surgeon: Lockie Mola, MD;  Location: [facility] ORS;  Service: Ophthalmology;  Laterality: Left;  [place] 01:09.0AP% 23.7CDE 16.35FLUID PACK LOT # [id number]   CHOLECYSTECTOMY N/A 04/25/2016   Procedure: LAPAROSCOPIC CHOLECYSTECTOMY WITH INTRAOPERATIVE CHOLANGIOGRAM;  Surgeon: Kieth Brightly, MD;  Location: [facility] ORS;  Service: General;  Laterality: N/A;   CORONARY ANGIOPLASTY     STENT   CORONARY ARTERY BYPASS GRAFT  2001   KNEE SURGERY     thyroid  1970   UPPER GI ENDOSCOPY  2014   VISCERAL ANGIOGRAPHY N/A 01/15/2020   Procedure: VISCERAL ANGIOGRAPHY;  Surgeon: Annice Needy, MD;  Location: [facility] INVASIVE CV LAB;  Service: Cardiovascular;  Laterality: N/A;    Allergies  Allergies  Allergen Reactions  Bee Pollen     Other reaction(s): Other (See Comments)   Boniva [Ibandronic Acid]     Jaw pain   Pollen Extract    Motrin [Ibuprofen] Rash    History of Present Illness    Kayla [name] is a 87 y.o. female with PMH as above.    06/2019 echo showed normal LV SF, G2 DD, mild to moderate valvular disease.    She was diagnosed with PAF 10/2019 and placed on Eliquis.  She reports bruising on Eliquis.  When seen in June 2021, she reported a syncopal episode that occurred while she was at her grandson's graduation party.  She had had a 5-hour episode of chest pain earlier that morning.  She was evaluated in the ED same day.  She was not interested in  repeat event monitoring at follow-up.  Repeat MPI 12/2019 low risk.    She underwent SMA stenting 01/2020.  This resulted in improvement in her earlier abdominal discomfort and nausea/emesis.  At subsequent office visits, she noted improvement in chest pain. She has LE arterial dz as below. 05/2020 Mesenteric and LE arterial scans as below.   She has chronic DOE.  At previous visits, she did note some DOE with walking up the inclines around her house.   When last seen in the office 05/2020, she was scheduled to see vein and vascular surgery for lower extremity edema.  She did not like to wear compression hose.  She had had a recent fall with rib fracture on the right side twice in the previous month with pain slightly improved at her visit.  EKG showed atrial fibrillation with patient asymptomatic.  BP at home was noted to be 130s.  Given worsening lower extremity edema, recommendation was to hold amlodipine.  Clonidine was increased to 0.2 mg twice daily.  It was recommended she take an extra half dose of carvedilol 6.25 mg twice daily with heart rate greater than 90 bpm in atrial fibrillation (increasing her to a total of Coreg 18.5 mg twice daily on those days).  It was recommended to the patient's daughter that they purchase a pulse oximeter to monitor her rate.  For ongoing uncontrolled BP, PCP increased hydralazine to 50 mg 3 times daily for more BP support and discontinued chlorthalidone due to low sodium.  Between visits, the patient called the office about her losartan with recommendation to take 50 mg twice per day.   On 10/21/2020, she went to the ED with SBP 180s to 200s and suboptimal response to her breakthrough blood pressure medication.  Medication reconciliation performed..It was suspected she was taking hydralazine as needed, rather than 3 times daily.  She was seen in [facility] clinic with discovery her BP cuff was not accurate then replaced.  Medication reconciliation performed again.   She did report some unchanged dyspnea and lower extremity edema.  SBP 130s to 190s, DBP 50s to 100s, heart rate 50s to 90s.  Hydralazine discontinued due to difficulty with 3 times daily dosing.  Chlorthalidone 12.5 mg reinitiated.  Losartan transition to valsartan 160 mg daily.  Seen again 10/29/2020.  Her daughter made a pillbox for her to utilize for medications.  She was taking morning medications at 7:30 AM and evening around 5 PM.  Home BP 10 points higher than office calculations and ranging SBP 159-184.  She reported stable dyspnea and some fatigue.  Valsartan increased to 320 mg daily.  With repeat labs, chlorthalidone low with recommendation to decrease to chlorthalidone every other  day.    Subsequent 4/26 carotid study showed minimal left carotid disease with mild to moderate right carotid disease, estimated at 40 to 59%.  Repeat study is recommended in 1 year.  BP was noted to be elevated.    At her 5/13 follow-up with her primary cardiologist, Dr. Mariah Milling, it was noted that labile BP made her pressures difficult to treat.  It was thought that these labile BP readings were due to the timing of clonidine.  Valsartan was moved to noon.  Hydralazine TID recommended for SBP over 160.  BP checks recommended in the afternoon.    On 12/01/2020 -12/02/2020, she was seen at [facility] and admitted for witnessed syncopal episode.  She was sitting on her front porch when her family witnessed her slumped over with nausea and emesis.  She was able to state her name.  It was noted she had another syncopal episode that lasted less than a minute with EMS in route.  At arrival, she reported nausea and abdominal pain.  She was hypertensive with BP 185/52.  She had started feeling bad earlier in the afternoon.   EKG with nonspecific findings and slightly prolonged QTC.  Troponin WNL.  CBC with hemoglobin 11.8.  Sodium 131, compared to 129 1 month prior.  TSH and magnesium WNL.  No suspicion for PE was noted.  UA did not  appear positive for infection.  Subsequent imaging showed moderate hiatal hernia and focal distention of the transverse colon.  Orthostatic hypotension noted with patient asymptomatic.  Gastroenteritis versus dehydration discussed.  Metoprolol was added to carvedilol for symptomatic tachycardia.  She was subsequently treated for UTI with antibiotic.  When seen by NP via palliative care, she reported becoming weaker over the last 3 to 4 months.  She required assistance to stand.  Over the last 2 to 3 weeks, she was unable to walk without assistance.  She had 2 falls over the past 4 months.  She reported dizziness with position changes.  She was sleeping in a recliner due to arthritis and pain in her knees, as well as difficulty getting in and out of bed.  She required assistance with ADLs.  A lift chair was purchased by the family and being delivered same day.  Appetite was coming and going.  She reported a 13 pound weight loss over the last 2 months.  She was trying to supplement with Ensure or boost on days she did not eat as much.  She reported occasional constipation.  In subsequent telephone interactions, clonidine increased to 0.2mg  TID. This occurred late June. It was recommended she continue her Eliquis given her risk for embolic event with history of paroxysmal atrial fibrillation.  SBP 160s to 180s.  Primary cardiologist for recommendation was to avoid amlodipine given her prior swelling.  After it increased to clonidine 3 times daily, a nursing home visit documentation from 6/29 shows SBP 180s to 190s.  She reported a headache once in a while and dizziness on occasion, especially with position changes.  No pain.  She continued to sleep in her recliner.  Ongoing shortness of breath/dyspnea reported with patient report that she would become winded when walking short distances, as well as bathing.  She was sleeping in a recliner due to her breathing.  She required help with her ADLs, except feeding.   Difficulty getting in and out of bed due to arthritis was noted on review of documentation with bed order pending.  It was noted that the patient was unable  to go short distances without getting winded or weak.  They had not heard anything about PT services being started with NP indicating that she would reach out to the clinical navigator.  Her appetite had improved from previous visits.  She reported occasional constipation, relieved with MiraLAX.  BP at the time of this visit 188/70 with HR 58 bpm. It was recommended palliative care continue to follow for complex medical decision making, advance care planning, and clarification of goals.  On 01/23/2021, she presented to the [facility] emergency department with left lower quadrant abdominal pain, intermittent over the past couple of weeks leading up to her presentation.  No emesis.  She reported diarrhea 1 day the previous week.  She reported appendix and gallbladder removal.  BP 203/75, HR 75 bpm, RR 21, 96% on room air.  Labs showed lipase 22, CBC with WBC 6.6, hemoglobin 12.4, platelets 178.  Sodium 130, glucose 117, AST 14.  EKG showed sinus rhythm with supraventricular bigeminy, LAD, QTC prolonged at 517, left bundle branch block.  CT abdomen and pelvis showed moderate hiatal hernia and aortic atherosclerosis.  Today, 01/24/2021, she returns to clinic and notes left-sided abdominal pain.  She reports this pain is worse when she breathes or tries to move, particularly when she tries to get up.  She reports chronic lower extremity edema that is unchanged from previous clinic visits.  She reports ongoing dyspnea and fatigue.  She sleeps in a recliner.  She is supposed to be getting a hospital bed in approximately 2 months, though it has yet to arrive.  She reports her PCP put in the order.  She has a new nurse coming to the home, and we will reach out to this nurse today as below, given she reports difficulty coming into the office for repeat blood work.  She denies  any presyncope or syncope.  No signs or symptoms of bleeding.  She reports a medication schedule of 8 AM, 2 PM, and 8 PM.  BP log brought with her today and transcribed as below.  Reports a new NP= Gwinda Maine, NP of [facility] at [phone].  Previous NP was Amy K. Garner Nash, NP.  Her note on 12/16/2020 was reviewed in detail.  Also reviewed was her note from 01/12/2021.  See BP under CV studies  Home Medications    Current Outpatient Medications on File Prior to Visit  Medication Sig Dispense Refill   apixaban (ELIQUIS) 5 MG TABS tablet TAKE 1 TABLET BY MOUTH  TWICE DAILY 180 tablet 1   carvedilol (COREG) 12.5 MG tablet TAKE 1 TABLET BY MOUTH  TWICE DAILY 180 tablet 3   chlorthalidone (HYGROTON) 25 MG tablet Take 0.5 tablets (12.5 mg total) by mouth every other day.     cloNIDine (CATAPRES) 0.2 MG tablet Take 1 tablet (0.2 mg total) by mouth 3 (three) times daily. Take at breakfast, mid afternoon, and before bed 180 tablet 3   Cyanocobalamin (VITAMIN B 12 PO) Take 3,000 Units by mouth daily.     famotidine (PEPCID) 20 MG tablet TAKE 1 TABLET BY MOUTH  TWICE DAILY 180 tablet 3   hydrALAZINE (APRESOLINE) 50 MG tablet Take 1 tablet (50 mg total) by mouth 3 (three) times daily as needed. 270 tablet 3   levothyroxine (SYNTHROID) 100 MCG tablet Take 100 mcg by mouth every morning.     metoprolol tartrate (LOPRESSOR) 25 MG tablet Take 1 tablet (25 mg total) by mouth daily as needed. Take only if you have symptomatic tachycardia  with heart rate greater than 120 beats per minute 30 tablet 3   nitroGLYCERIN (NITROSTAT) 0.4 MG SL tablet Place 1 tablet (0.4 mg total) under the tongue every 5 (five) minutes as needed for chest pain. 25 tablet 1   ondansetron (ZOFRAN ODT) 4 MG disintegrating tablet Take 1 tablet (4 mg total) by mouth every 8 (eight) hours as needed for nausea or vomiting. 20 tablet 0   pravastatin (PRAVACHOL) 20 MG tablet TAKE 1 TABLET BY MOUTH AT  BEDTIME 90 tablet 3    valsartan (DIOVAN) 320 MG tablet Take 1 tablet (320 mg total) by mouth daily. 90 tablet 3   No current facility-administered medications on file prior to visit.    Review of Systems    She reports left abdominal/flank pain that is worse with deeper breathing and position changes, fatigue, orthopnea with patient sleeping in a recliner and awaiting hospital bed, dyspnea with minimal ambulation and ADLs, PND, edema, poor appetite with earlier in nausea without emesis as above.  She denies chest pain, palpitations, presyncope, syncope.   All other systems reviewed and are otherwise negative except as noted above.  Physical Exam    VS:  BP (!) 150/64 (BP Location: Right Arm, Patient Position: Sitting, Cuff Size: Large)   Pulse (!) 53   Ht  (1.549 m)   Wt 144 lb (65.3 kg)   SpO2 98%   BMI 27.21 kg/m  , BMI Body mass index is 27.21 kg/m. GEN: Elderly female, in no acute distress.  Joined by her daughter. HEENT: normal. Neck: Supple, JVD difficult to assess due to position of patient in a chair.  No, carotid bruits, or masses. Cardiac: Bradycardic but regular, 2/6 systolic murmur RUSB.  No, rubs, or gallops. No clubbing, cyanosis.  Bilateral moderate to 1+ edema.  Radials/DP/PT 2+ and equal bilaterally.  Respiratory: Poor inspiratory effort, bilateral reduced breath sounds. GI: Soft, nontender, nondistended, BS + x 4. MS: no deformity or atrophy. Skin: warm and dry, no rash. Neuro: At times, she appears confused.  Strength and sensation are intact. Psych: At times, she appears confused.  Normal affect.  Accessory Clinical Findings    ECG personally reviewed by me today -sinus bradycardia, 53 bpm, left axis deviation with IVCD/LVH with repolarization abnormalities, prolonged PR interval at 188 ms, prolonged QTC of 450 ms though improved from previous ED EKG, known left bundle branch block with QRS 150 ms.- no acute changes.  VITALS Reviewed today   Temp Readings from Last 3  Encounters:  01/23/21 98.3 F (36.8 C) (Oral)  12/02/20 98.3 F (36.8 C) (Oral)  10/21/20 98.6 F (37 C) (Oral)   BP Readings from Last 3 Encounters:  01/24/21 (!) 150/64  01/23/21 (!) 189/59  01/12/21 (!) 188/70   Pulse Readings from Last 3 Encounters:  01/24/21 (!) 53  01/23/21 60  01/12/21 (!) 58    Wt Readings from Last 3 Encounters:  01/24/21 144 lb (65.3 kg)  01/23/21 144 lb (65.3 kg)  12/16/20 145 lb (65.8 kg)     LABS  reviewed today   Lab Results  Component Value Date   WBC 6.6 01/23/2021   HGB 12.4 01/23/2021   HCT 36.4 01/23/2021   MCV 90.3 01/23/2021   PLT 178 01/23/2021   Lab Results  Component Value Date   CREATININE 0.93 01/23/2021   BUN 19 01/23/2021   NA 130 (L) 01/23/2021   K 4.4 01/23/2021   CL 98 01/23/2021   CO2 24 01/23/2021  Lab Results  Component Value Date   ALT 9 01/23/2021   AST 14 (L) 01/23/2021   ALKPHOS 42 01/23/2021   BILITOT 0.9 01/23/2021   Lab Results  Component Value Date   CHOL 114 07/29/2018   HDL 54.60 07/29/2018   LDLCALC 45 07/29/2018   TRIG 68.0 07/29/2018   CHOLHDL 2 07/29/2018    Lab Results  Component Value Date   HGBA1C 6.1 (H) 12/24/2019   Lab Results  Component Value Date   TSH 1.368 12/01/2020     STUDIES/PROCEDURES reviewed today   BP log per pt records and transcribed with DRAGON DICTATION 6/11 195/69 60 bpm 5 PM  6/13 160/68 48 bpm 11 AM  6/14 198/84 56 bpm 5:30 PM  6/5 Pain 149/55 56 bpm 11:22 AM  6/17 178/65 48 bpm 11:34 AM  6/18 123/56 49 bpm 11:15 AM  6/20 189/64 65 bpm 3:10 PM  6/22 212/79 HA 67 bpm 6:00 PM  6/23 205 systolic 60 bpm 12:15 PM  6/23 160/62 74 bpm 2:15 PM  6/23 182/60 63 bpm 4:15 PM  6/24 164/65 50 bpm 11 AM  6/24 192/67 59 bpm 3:15 PM  6/25 151/58 49 bpm 11 AM  6/25 205/70 55 bpm 3:30 PM  6/26 136/52 59 bpm 11:30 AM  6/26 193/68 58 bpm 6:00 PM  6/27 172/63 49 bpm 11:15 AM  6/28 185/65 with back and side pain 55 bpm 11 AM  6/28 161/60 73 bpm 2:15 PM  6/28  178/68 67 bpm 4:45 PM  6/29 170/62; 177/65 57, 51 bpm 1 PM, 6:25 PM  6/30 149/62, 150/63 51 bpm, 61 bpm 11:07 AM, 7:12 PM  7/1 180/66  48 bpm 11:40 AM  7/2 180/68 54 bpm 11:28 AM  7/3 196/65 49 bpm 12:08 PM  7/4 160/56 55 bpm 1:15 PM  7/5 168/65, 150/60 55, 54 bpm 12:00 PM/6:23 PM  7/6 196/84 49 bpm 5:15 PM  7/7 155/71 67 bpm 1:30 PM  7/8 149/55 59 bpm, pain in left side 2:00 PM  7/9 146/62 55 bpm 11:05 AM  7/10 177/77 47 bpm 11:00 AM   Echo 11/18/20  1. Left ventricular ejection fraction, by estimation, is 50 to 55%. The  left ventricle has low normal function. The left ventricle demonstrates  global hypokinesis. There is mild left ventricular hypertrophy. Left  ventricular diastolic parameters are  consistent with Grade I diastolic dysfunction (impaired relaxation).   2. Right ventricular systolic function is normal. The right ventricular  size is normal. There is mildly elevated pulmonary artery systolic  pressure. The estimated right ventricular systolic pressure is 37.7 mmHg.   3. Left atrial size was moderately dilated.   4. The mitral valve is normal in structure. Mild to moderate mitral valve  regurgitation.   5. The aortic valve is normal in structure. Aortic valve regurgitation is  mild. Mild to moderate aortic valve sclerosis/calcification is present,  without any evidence of aortic stenosis. Aortic valve mean gradient  measures 7.0 mmHg.   12/2019 MPI The study is normal. This is a low risk study. The left ventricular ejection fraction is normal (52%). There is no evidence for ischemia   09/2019 Ultrasound mesenteric Summary:  Mesenteric:  Normal Celiac artery , Hepatic artery and Splenic artery findings. 70 to  99%  stenosis in the superior mesenteric artery.  SMA Velocities compared to prior study on 06/16/2019 appear to be  decreased.   Echo 06/2019  1. Left ventricular ejection fraction, by visual estimation, is  55 to  60%. The left ventricle has normal  function. There is borderline left  ventricular hypertrophy.   2. Elevated left atrial pressure.   3. Left ventricular diastolic parameters are consistent with Grade II  diastolic dysfunction (pseudonormalization).   4. The left ventricle has no regional wall motion abnormalities.   5. Global right ventricle has normal systolic function.The right  ventricular size is normal. No increase in right ventricular wall  thickness.   6. Left atrial size was normal.   7. Right atrial size was normal.   8. Mild aortic valve annular calcification.   9. The mitral valve is degenerative. Mild to moderate mitral valve  regurgitation. No evidence of mitral stenosis.  10. The tricuspid valve is normal in structure. Tricuspid valve  regurgitation is mild.  11. The aortic valve is tricuspid. Aortic valve regurgitation is mild.  Mild to moderate aortic valve sclerosis/calcification without any evidence  of aortic stenosis.  12. The pulmonic valve was normal in structure. Pulmonic valve  regurgitation is mild.  13. TR signal is inadequate for assessing pulmonary artery systolic  pressure.  14. The inferior vena cava is normal in size with greater than 50%  respiratory variability, suggesting right atrial pressure of 3 mmHg.   Cardiac monitor 05/2019 Normal sinus rhythm avg HR of 56 bpm. 1 run of Ventricular Tachycardia occurred lasting 9 beats with a max rate of 143 bpm (avg 118 bpm). 68 Supraventricular Tachycardia runs occurred, the run with the fastest interval lasting 6 beats with a max rate of 187 bpm, the longest lasting 27.9 secs with an avg rate of 103 bpm.  Isolated SVEs were occasional (1.8%, 19772), SVE Couplets were rare (<1.0%, 117), and SVE Triplets were rare (<1.0%, 10). Isolated VEs were rare (<1.0%, 1836), VE Couplets were rare (<1.0%, 3), and VE Triplets were rare (<1.0%, 1). Ventricular Trigeminy was present. No patient triggered events noted.  Ultrasound renal artery  bilateral 03/26/2019 Summary:  Renal:  Right: Normal size right kidney. Abnormal right Resistive Index.         Normal cortical thickness of right kidney. No evidence of         right renal artery stenosis. RRV flow present.  Left:  LRV flow present. No evidence of left renal artery stenosis.         Normal size of left kidney. Abnormal left Resisitve Index.         Normal cortical thickness of the left kidney.  Mesenteric:  70 to 99% stenosis in the celiac artery and superior mesenteric artery.    Assessment & Plan    HFmrEF --She reports increasing dyspnea in the setting of volume overload on exam.  Most recent echo as above.  Echo showed low normal pump function at 50 to 55%, global hypokinesis of the LV mild LVH, G1 DD, mildly elevated PASP with RVSP 37.7 mmHg, left atrial size moderately dilated, mild to moderate MR, aortic valve regurgitation mild, mild to moderate aortic valve sclerosis/calcification without stenosis, aortic valve mean gradient 7.0.  Previous echo from 06/2019 with EF 55 to 60%.  Will increase to chlorthalidone 12.5 mg qd with recommendation to transition to an alternative diuretic (Lasix/torsemide) if repeat labs show a worsening hyponatremia. She has a history of hyponatremia that dates back to 2014 and could be due to her history of volume overload, though recommend transition to loop diuretic if ongoing hyponatremia-discussed with primary cardiologist.  For now, we will continue at current dose and have her strictly  follow Na/fluid recommendations.   Continue BP support.  At future clinic visits, consider transition from chlorthalidone to Lasix or torsemide.  At RTC, given echo results as above and upcoming visit with primary cardiologist already scheduled to discuss these results, recommend primary cardiologist review options for further ischemic work-up at this visit as below under CAD.  Poorly controlled HTN, goal BP 130/80 or lower -- Ongoing issues with elevated BP  on review of chart.  Please see above for transcribed Home BP log in great detail.  Given ongoing elevated volume status, increased to chlorthalidone 12.5 mg daily.  Unfortunately, despite decreased dose frequency of chlorthalidone, her sodium has remained low.  As above, this could be due to volume overload.  We will monitor her sodium closely and consider transition to torsemide or Lasix in the near future.  Hyponatremia does date back to 2014 on review labs.  We will reach out to [facility], NP at [facility] palliative and obtain repeat blood work to closely follow along with her sodium.  Given her limited mobility and confusion at times, she is unable to make it in for repeat labs.  Continue taking clonidine 0.2 mg 3 times daily and hydralazine 50 mg 3 times daily for SBP greater than 160.  Ideally, wean down off of hydralazine and clonidine in the future, given it is difficult to maintain medications that are dosed multiple times per day, as well as the associated rebound hypertension and tachycardia associated with each of these medications.  Continue carvedilol as directed.  Continue valsartan 320 mg.  Reviewed recommendation for all fluids under 2 L/day and salt under 2 g/day.  Reviewed recommendations for BP measurements.  Reassess at RTC.    CAD s/p CABG 2001/EKG changes --No reported anginal symptoms with exertion or at rest.  She does report dyspnea, which could be her anginal equivalent, and on review of NP documentation appears to be worsening from previous clinic visits.  Previous negative Myoview and echo with normal EF.  Most recent EF with low normal pulm function and LV hypokinesis.  Recommend patient discuss options for further ischemic work-up with her primary cardiologist at her upcoming visit.  For now, continue beta-blocker, ARB, and statin.  No ASA in the setting of Eliquis.    Valvular heart disease -- Most recent echo as above with mild to moderate MR and mild AR.  Consider that  these could be contributing to her symptoms.  Recommend BP and heart rate control.  Discussed this with patient today. Continue to monitor for symptoms of worsening dyspnea, chest pain.  Annual echo recommended.  Paroxysmal atrial fibrillation Sinus bradycardia --Maintaining NSR with rate well controlled bradycardic.  Will discontinue Lopressor and continue Coreg. Lopressor was added at her most recent admission, and given she is already on a beta-blocker (Coreg), this was not recommended. This additional BB is likely to exacerbate dizziness/presyncope/orthostatic sx and likely to result in medication confusion and polypharmacy, rather than work for the patient.  Lopressor could also result in worsening breathing status and recurrent syncope.  We will plan to remove this from her medications list today.  Continue Eliquis for indefinite anticoagulation to reduce the risk of thromboembolic event given her paroxysmal atrial fibrillation.    HLD, LDL goal below 70 -- 05/2019 LDL 45 and well controlled.  No repeat LDL yet from this year.  Continue statin.  If LDL not obtained by RTC, recommend obtain LDL per primary cardiologist.  Mesenteric ischemia s/p SMA stenting -- 11/23/2020 elements  of [facility] appointment with recommendation for formal exercise program and antiplatelet therapy, as well as aggressive treatment of lipid abnormalities.  Continue to follow-up with [facility] as recommended.  Reviewed signs and symptoms of worsening mesenteric ischemia.  She denies any abdominal pain or trouble with eating.  Continue to monitor.  PAD/carotid artery disease --Continue follow-up as per vein and vascular surgery.  Most recent carotid study obtained by primary cardiologist with less than 60% stenosis bilaterally.  Recommend heart rate, BP, glycemic, and cholesterol control. Annual study recommend, as well as HR/BP/LDL/glycemic control.   History of syncope -- Recent hospital admission for syncope as above.  Denies any  recent syncopal episodes.  Discontinue additional (Lopressor) BB as above. Most recent echo as above to assess for changes in valvular function/ acute structural changes or abnormalities.  Continue to monitor for symptoms/BP.  Hyponatremia --Long history of hyponatremia.  Consider that hyponatremia could be secondary to volume overload, especially given it does not improve with alterations to her chlorthalidone regimen.  As above, will reassess transition from chlorthalidone to loop diuretic at RTC.  Hypothyroidism  --- Review of previous clinic notes show thyroid level overtreated per 4/15 lbas with recommendation to reduce thyroid medication and recheck thyroid levels in 1 month.  Subsequent 11/2020 labs wnl.  Continue treatment per PCP recommendations, given this can influence BP.  Prolonged QTC on previous EKGs --Discontinued ondansetron/Zofran given previous EKGs at [facility] ED have shown prolonged QTC. Fortunately, today's EKG shows Qtc under 500 ms.  Prolonged QTC increases risk of torsades and is associated with antiemetics. For nausea, recommend continue famotidine or even change to Protonix in the future.  LLQ abdominal pain -- As above, recommend avoid antiemetics/Zofran given prolonged QTC.  Hiatal hernia noted on imaging and likely contributing to symptoms.  Recommend follow-up with GI at her earliest convenience.  Continue to monitor for signs and symptoms of bleeding on anticoagulation.   Disposition: RTC as scheduled with Dr. Mariah Milling.   Labs: BMET in 2 weeks (reached out to [facility], NP to obtain this at home, given limited mobility of patient and as she is unable to come back to clinic for repeat labs) Future considerations: Echo discussion with MD/options discussed for work-up of LV hypokinesis and mildly reduced EF.  Repeat LDL given previous LDL from 2021.  Change chlorthalidone to loop diuretic.  Repeat EKG to reassess QTC. Medication changes: Chlorthalidone 12.5 mg frequency  increased to daily chlorthalidone.  Discontinue Zofran given prolonged QTC.  Discontinue Lopressor given history of orthostatic hypotension and bradycardia, and as patient is already on carvedilol.  Lennon Alstrom, PA-C 01/24/2021

## 2021-01-23 NOTE — ED Provider Notes (Signed)
[facility] Emergency Department Provider Note    ____________________________________________   I have reviewed the triage vital signs and the nursing notes.   HISTORY  Chief Complaint Abdominal Pain   History limited by: Not Limited   HPI DELILIAH [name] is a 87 y.o. female who presents to the emergency department today because of concern for abdominal pain. Located in the LLQ. The pain has been intermittent over the past couple of weeks. Came in today because the pain was worse than it normally has been. She denies any vomiting. Did have diarrhea one day last week. No fevers. No similar pain in the past. States she has a history of having her appendix and gallbladder removed.   Records reviewed. Per medical record review patient has a history of appendectomy, cholecystectomy.  Past Medical History:  Diagnosis Date   Arthritis    Bundle branch block, left    Chronic   CAD (coronary artery disease)    a. 2001 s/p CABG @ [facility]; b. 2009 Neg MV; c. 12/2019 MV: EF 52%, no ischemia.   Carotid arterial disease (HCC)    a. 10/2019 Carotid U/S: RICA 40-59, LICA 1-39. F/u 2 yrs.   Cholecystitis    from record   Diastolic dysfunction    a. 06/2019 Echo: EF 55-60%, Gr2 DD.   Emphysema lung (HCC)    GERD (gastroesophageal reflux disease)    Heart murmur    HOH (hard of hearing)    Hyperlipidemia    Hypertension    Hypothyroidism    Orthostatic headache    Mild   PAF (paroxysmal atrial fibrillation) (HCC)    a. Dx 10/2019 - converted spontaneously to sinus ED; b. CHA2DS2VASc = 5-->Eliquis.   Pain    HIP AND LEG PAIN USES  CANE   Superior mesenteric artery stenosis (HCC)    a. 03/2019 renal duplex w/ incidental finding of celia and SMA stenosis; b. 09/2019 Mesenteric Duplex: Nl Celiac, Hepatic, and Splenic arteries. 70-99% SMA stenosis-->Seen by vasc surgery; c. 01/2020 SMA 75-80%(7x26 balloon expandable stent). Celiac 80-90%.   Valvular heart disease    a.  06/2019 Echo: EF 55-60%, Gr2 DD. No rwma. Nl RV fxn. Mild-mod MR. Mild TR/AI/PR. Mild to mod Ao sclerosis w/o stenosis.   Vertigo     Patient Active Problem List   Diagnosis Date Noted   Pain in limb 05/25/2020   Diastolic dysfunction 04/19/2020   Paroxysmal atrial fibrillation (HCC) 04/19/2020   Mesenteric ischemia (HCC) 03/12/2020   CAD (coronary artery disease)    Syncope 12/24/2019   Atrial fibrillation (HCC) 11/26/2019   Osteoarthritis of knee 10/24/2019   Low grade fever 10/20/2019   Chest pain 03/21/2019   Osteoporosis 08/27/2017   Carotid stenosis 08/08/2016   Chronic kidney disease 08/08/2016   Hypothyroidism 08/08/2016   Hypertension 01/09/2013   Essential hypertension 01/09/2013   Hyperlipidemia 07/07/2011   GERD (gastroesophageal reflux disease) 06/10/2010   CAD (coronary artery disease), autologous vein bypass graft 04/15/2009    Past Surgical History:  Procedure Laterality Date   APPENDECTOMY  1957   BREAST BIOPSY Left 2011   neg, stereo done by Dr. Evette Cristal   BREAST BIOPSY     CARDIAC CATHETERIZATION  2000   [facility]   CATARACT EXTRACTION Right    right 2004, left 2018    CATARACT EXTRACTION W/PHACO Left 04/12/2017   Procedure: CATARACT EXTRACTION PHACO AND INTRAOCULAR LENS PLACEMENT (IOC);  Surgeon: Lockie Mola, MD;  Location: [facility] ORS;  Service: Ophthalmology;  Laterality:  Left;  [place] 01:09.0AP% 23.7CDE 16.35FLUID PACK LOT # [id number]   CHOLECYSTECTOMY N/A 04/25/2016   Procedure: LAPAROSCOPIC CHOLECYSTECTOMY WITH INTRAOPERATIVE CHOLANGIOGRAM;  Surgeon: Kieth Brightly, MD;  Location: [facility] ORS;  Service: General;  Laterality: N/A;   CORONARY ANGIOPLASTY     STENT   CORONARY ARTERY BYPASS GRAFT  2001   KNEE SURGERY     thyroid  1970   UPPER GI ENDOSCOPY  2014   VISCERAL ANGIOGRAPHY N/A 01/15/2020   Procedure: VISCERAL ANGIOGRAPHY;  Surgeon: Annice Needy, MD;  Location: [facility] INVASIVE CV LAB;  Service: Cardiovascular;  Laterality: N/A;    Prior to  Admission medications   Medication Sig Start Date End Date Taking? Authorizing Provider  apixaban (ELIQUIS) 5 MG TABS tablet TAKE 1 TABLET BY MOUTH  TWICE DAILY 01/10/21   Antonieta Iba, MD  carvedilol (COREG) 12.5 MG tablet TAKE 1 TABLET BY MOUTH  TWICE DAILY Patient taking differently: Take 12.5 mg by mouth 2 (two) times daily with a meal. 06/23/20   Arnett, Lyn Records, FNP  chlorthalidone (HYGROTON) 25 MG tablet Take 0.5 tablets (12.5 mg total) by mouth every other day. 11/02/20   Alver Sorrow, NP  cloNIDine (CATAPRES) 0.2 MG tablet Take 1 tablet (0.2 mg total) by mouth 3 (three) times daily. Take at breakfast, mid afternoon, and before bed 01/10/21   Antonieta Iba, MD  Cyanocobalamin (VITAMIN B 12 PO) Take 3,000 Units by mouth daily.    [provider]  famotidine (PEPCID) 20 MG tablet TAKE 1 TABLET BY MOUTH  TWICE DAILY Patient taking differently: Take 20 mg by mouth 2 (two) times daily. 04/07/20   Allegra Grana, FNP  hydrALAZINE (APRESOLINE) 50 MG tablet Take 1 tablet (50 mg total) by mouth 3 (three) times daily as needed. 11/26/20   Antonieta Iba, MD  levothyroxine (SYNTHROID) 100 MCG tablet Take 100 mcg by mouth every morning. 11/17/20   [provider]  metoprolol tartrate (LOPRESSOR) 25 MG tablet Take 1 tablet (25 mg total) by mouth daily as needed. Take only if you have symptomatic tachycardia with heart rate greater than 120 beats per minute 01/10/21 02/09/21  Antonieta Iba, MD  nitroGLYCERIN (NITROSTAT) 0.4 MG SL tablet Place 1 tablet (0.4 mg total) under the tongue every 5 (five) minutes as needed for chest pain. 03/21/19   Allegra Grana, FNP  ondansetron (ZOFRAN ODT) 4 MG disintegrating tablet Take 1 tablet (4 mg total) by mouth every 8 (eight) hours as needed for nausea or vomiting. 12/02/20   Georgeann Oppenheim, Jonelle Sports, MD  pravastatin (PRAVACHOL) 20 MG tablet TAKE 1 TABLET BY MOUTH AT  BEDTIME Patient taking differently: Take 20 mg by mouth daily.  06/23/20   Allegra Grana, FNP  valsartan (DIOVAN) 320 MG tablet Take 1 tablet (320 mg total) by mouth daily. 01/10/21   Antonieta Iba, MD    Allergies Bee pollen, Boniva [ibandronic acid], Pollen extract, and Motrin [ibuprofen]  Family History  Problem Relation Age of Onset   Heart failure Mother 16   Heart failure Sister 38   Heart attack Sister    Heart attack Brother 85   Breast cancer Neg Hx     Social History Social History   Tobacco Use   Smoking status: Never   Smokeless tobacco: Never  Vaping Use   Vaping Use: Never used  Substance Use Topics   Alcohol use: No   Drug use: No    Review of Systems Constitutional: No fever/chills  Eyes: No visual changes. ENT: No sore throat. Cardiovascular: Denies chest pain. Respiratory: Denies shortness of breath. Gastrointestinal: Positive for abdominal pain. Genitourinary: Negative for dysuria. Musculoskeletal: Negative for back pain. Skin: Negative for rash. Neurological: Negative for headaches, focal weakness or numbness.  ____________________________________________   PHYSICAL EXAM:  VITAL SIGNS: ED Triage Vitals  Enc Vitals Group     BP 01/23/21 1354 (!) 203/75     Pulse Rate 01/23/21 1354 75     Resp 01/23/21 1354 (!) 21     Temp 01/23/21 1354 98.3 F (36.8 C)     Temp Source 01/23/21 1354 Oral     SpO2 01/23/21 1354 96 %     Weight 01/23/21 1358 144 lb (65.3 kg)     Height 01/23/21 1358 5\' 1"  (1.549 m)     Head Circumference --      Peak Flow --      Pain Score 01/23/21 1357 10    Constitutional: Alert and oriented.  Eyes: Conjunctivae are normal.  ENT      Head: Normocephalic and atraumatic.      Nose: No congestion/rhinnorhea.      Mouth/Throat: Mucous membranes are moist.      Neck: No stridor. Hematological/Lymphatic/Immunilogical: No cervical lymphadenopathy. Cardiovascular: Normal rate, regular rhythm.  No murmurs, rubs, or gallops.  Respiratory: Normal respiratory effort without  tachypnea nor retractions. Breath sounds are clear and equal bilaterally. No wheezes/rales/rhonchi. Gastrointestinal: Soft and non tender. No rebound. No guarding.  Genitourinary: Deferred Musculoskeletal: Normal range of motion in all extremities. No lower extremity edema. Neurologic:  Normal speech and language. No gross focal neurologic deficits are appreciated.  Skin:  Skin is warm, dry and intact. No rash noted. Psychiatric: Mood and affect are normal. Speech and behavior are normal. Patient exhibits appropriate insight and judgment.  ____________________________________________    LABS (pertinent positives/negatives)  Lipase 22 CBC wbc 6.6, hgb 12.4, plt 178 CMP wnl except na 130, glu 117, t pro 6.4, ast 14  ____________________________________________   EKG  I, 03/26/21, attending physician, personally viewed and interpreted this EKG  EKG Time: 1355 Rate: 71 Rhythm: sinus rhythm with supraventricular bigeminy Axis: left axis deviation Intervals: qtc 517 QRS: LBBB ST changes: no st elevation Impression: abnormal ekg ____________________________________________    RADIOLOGY  CT abd/pel Pending  ____________________________________________   PROCEDURES  Procedures  ____________________________________________   INITIAL IMPRESSION / ASSESSMENT AND PLAN / ED COURSE  Pertinent labs & imaging results that were available during my care of the patient were reviewed by me and considered in my medical decision making (see chart for details).   Patient presented to the emergency department today because of concern for LLQ abdominal pain. It has been intermittent for 2 weeks. On exam no tenderness. Blood work without any concerning leukocytosis. Will obtain CT scan to evaluate. Awaiting urine at time of sign out. ____________________________________________   FINAL CLINICAL IMPRESSION(S) / ED DIAGNOSES  Final diagnoses:  Abdominal pain, unspecified  abdominal location     Note: This dictation was prepared with Dragon dictation. Any transcriptional errors that result from this process are unintentional     Phineas Semen, MD 01/23/21 1456

## 2021-01-23 NOTE — ED Provider Notes (Signed)
On discharge patient says she feels better and does not have any further abdominal pain when I do examine her she is slightly tender in the left lower quadrant.  Urine and CT are negative however.  Patient looks well.  I will let her go.  She has an appoint with her cardiologist tomorrow and primary care on Wednesday.  They should be able to keep a close eye on her.  She and her caregiver know to return for any worsening fever vomiting etc.  I also asked him to return if she is not any better in a few days.   Arnaldo Natal, MD 01/23/21 1729

## 2021-01-23 NOTE — ED Triage Notes (Signed)
Pt arrives to ER via [facility] from home for lower left abd pain that began today. Had a BM today, denied it being diarrhea. CBG 117. VSS other than BP being elevated. Pt states nausea comes and goes.

## 2021-01-23 NOTE — ED Notes (Signed)
Pt given sandwich tray and water. Family member in room, helping pt eat

## 2021-01-23 NOTE — ED Notes (Signed)
Patient transported to CT 

## 2021-01-23 NOTE — ED Notes (Signed)
Pt with elevated bp at dc, EDP notified, bp meds ordered by EDP. Pt to wait for decrease in bp before dc.

## 2021-01-24 ENCOUNTER — Ambulatory Visit: Payer: Medicare Other | Admitting: Physician Assistant

## 2021-01-24 ENCOUNTER — Encounter: Payer: Self-pay | Admitting: Physician Assistant

## 2021-01-24 VITALS — BP 150/64 | HR 53 | Ht 61.0 in | Wt 144.0 lb

## 2021-01-24 DIAGNOSIS — I480 Paroxysmal atrial fibrillation: Secondary | ICD-10-CM

## 2021-01-24 DIAGNOSIS — I5189 Other ill-defined heart diseases: Secondary | ICD-10-CM

## 2021-01-24 DIAGNOSIS — I447 Left bundle-branch block, unspecified: Secondary | ICD-10-CM

## 2021-01-24 DIAGNOSIS — I509 Heart failure, unspecified: Secondary | ICD-10-CM

## 2021-01-24 DIAGNOSIS — I25718 Atherosclerosis of autologous vein coronary artery bypass graft(s) with other forms of angina pectoris: Secondary | ICD-10-CM | POA: Diagnosis not present

## 2021-01-24 DIAGNOSIS — R1032 Left lower quadrant pain: Secondary | ICD-10-CM

## 2021-01-24 DIAGNOSIS — K559 Vascular disorder of intestine, unspecified: Secondary | ICD-10-CM

## 2021-01-24 DIAGNOSIS — I351 Nonrheumatic aortic (valve) insufficiency: Secondary | ICD-10-CM

## 2021-01-24 DIAGNOSIS — Z7901 Long term (current) use of anticoagulants: Secondary | ICD-10-CM

## 2021-01-24 DIAGNOSIS — I10 Essential (primary) hypertension: Secondary | ICD-10-CM

## 2021-01-24 DIAGNOSIS — I739 Peripheral vascular disease, unspecified: Secondary | ICD-10-CM

## 2021-01-24 DIAGNOSIS — Z79899 Other long term (current) drug therapy: Secondary | ICD-10-CM

## 2021-01-24 DIAGNOSIS — I5022 Chronic systolic (congestive) heart failure: Secondary | ICD-10-CM

## 2021-01-24 DIAGNOSIS — R9431 Abnormal electrocardiogram [ECG] [EKG]: Secondary | ICD-10-CM

## 2021-01-24 DIAGNOSIS — E785 Hyperlipidemia, unspecified: Secondary | ICD-10-CM

## 2021-01-24 DIAGNOSIS — I340 Nonrheumatic mitral (valve) insufficiency: Secondary | ICD-10-CM

## 2021-01-24 MED ORDER — CHLORTHALIDONE 25 MG PO TABS: ORAL_TABLET | ORAL | Status: AC

## 2021-01-24 MED ORDER — CLONIDINE HCL 0.2 MG PO TABS: 0.2000 mg | ORAL_TABLET | Freq: Three times a day (TID) | ORAL | 1 refills | Status: AC

## 2021-01-24 NOTE — Patient Instructions (Addendum)
Medication Instructions:  - Your physician has recommended you make the following change in your medication:   1) INCREASE chlorthalidone 25 mg- take 0.5 tablet (12.5 mg) by mouth ONCE daily   *If you need a refill on your cardiac medications before your next appointment, please call your pharmacy*   Lab Work: - Your physician recommends that you have lab work in: 2 weeks- BMP (We are trying to arrange for this to be done in home with [facility]. We will let you know if this needs to be done differently)  If you have labs (blood work) drawn today and your tests are completely normal, you will receive your results only by: MyChart Message (if you have MyChart) OR A paper copy in the mail If you have any lab test that is abnormal or we need to change your treatment, we will call you to review the results.   Testing/Procedures: - none ordered   Follow-Up: At [facility], you and your health needs are our priority.  As part of our continuing mission to provide you with exceptional heart care, we have created designated Provider Care Teams.  These Care Teams include your primary Cardiologist (physician) and Advanced Practice Providers (APPs -  Physician Assistants and Nurse Practitioners) who all work together to provide you with the care you need, when you need it.  We recommend signing up for the patient portal called "MyChart".  Sign up information is provided on this After Visit Summary.  MyChart is used to connect with patients for Virtual Visits (Telemedicine).  Patients are able to view lab/test results, encounter notes, upcoming appointments, etc.  Non-urgent messages can be sent to your provider as well.   To learn more about what you can do with MyChart, go to [email or web address].    Your next appointment:   As scheduled in August   The format for your next appointment:   In Person  Provider:   Julien Nordmann, MD   Other Instructions N/a

## 2021-01-26 ENCOUNTER — Telehealth: Payer: Self-pay

## 2021-01-26 NOTE — Telephone Encounter (Signed)
Received call from daughter and returned call. Daughter was asking about home health and hospital bed.  This RN spoke with RN Naviagtor at [employer] whom has had difficulty getting home health due to insurance. Message to DR. Graciela Husbands and spoke with daughter regarding same.  Daughter advised patient has appt 7/13 with DR. Graciela Husbands and will follow up during this visit as well.  Daughter will let us know outcome

## 2021-01-26 NOTE — Telephone Encounter (Signed)
1047 am.  Phone call made to daughter [name] to schedule a home visit for blood work.  No answer but message has been left on VM.  Response pending.

## 2021-03-03 ENCOUNTER — Telehealth: Payer: Self-pay | Admitting: Student

## 2021-03-03 NOTE — Telephone Encounter (Signed)
Rec'd return call from daughter [name], and she was okay with [place] changing the time of the Palliative f/u visit to 12:30 PM on 03/08/21.

## 2021-03-03 NOTE — Telephone Encounter (Signed)
Attempted to contact daughter [name] to see if we could change the time of the Palliative f/u visit on 03/08/21 from 11 AM to 12:30 PM - left message requesting a return call to let me know if this would be okay or not

## 2021-03-08 ENCOUNTER — Other Ambulatory Visit: Payer: Self-pay

## 2021-03-08 ENCOUNTER — Other Ambulatory Visit: Payer: Medicare Other | Admitting: Student

## 2021-03-08 DIAGNOSIS — R630 Anorexia: Secondary | ICD-10-CM

## 2021-03-08 DIAGNOSIS — Z515 Encounter for palliative care: Secondary | ICD-10-CM

## 2021-03-08 DIAGNOSIS — I10 Essential (primary) hypertension: Secondary | ICD-10-CM

## 2021-03-08 DIAGNOSIS — R531 Weakness: Secondary | ICD-10-CM

## 2021-03-08 DIAGNOSIS — I5032 Chronic diastolic (congestive) heart failure: Secondary | ICD-10-CM

## 2021-03-08 NOTE — Progress Notes (Signed)
[facility] Consult Note Telephone: [phone]  Fax: [phone]    Date of encounter: 03/08/21 12:45 PM PATIENT NAME: Kayla [name] [address] [place] [address]   [phone] (home)  DOB: 1926-11-16 MRN: [id number] PRIMARY CARE PROVIDER:    Adin Hector, MD,  [address] [place] [address] [phone]  REFERRING PROVIDER:   Adin Hector, MD [facility] [name],  [place] [address] [phone]  RESPONSIBLE PARTY:    Contact Information     Name Relation Home Work Mobile   Kayla [name] Daughter [phone]   Kayla, [name] [phone]     Kayla [name] Daughter [phone]  [phone]        I met face to face with patient and family in the home. Palliative Care was asked to follow this patient by consultation request of  Kayla Hector, MD to address advance care planning and complex medical decision making. This is a follow up visit.                                   ASSESSMENT AND PLAN / RECOMMENDATIONS:   Advance Care Planning/Goals of Care: Goals include to maximize quality of life and symptom management.   CODE STATUS: DNR  Symptom Management/Plan:  Appetite-patient with decline in appetite recently. Encourage foods patient enjoys, offer ensure, or premier shakes if patient is eating 50% or less of a meal. Discussed mirtazapine should her appetite not improve.   Hypertension-continue current regimen of valsartan, hydralazine, chlorthalidone, coreg, as directed. Continue daily blood pressures. Recommend taking her hydralazine in the afternoon as she is only taking in the morning. Follow up with cardiology as scheduled.   Heart failure-dyspnea with exertion, no worsening edema. Continue chlorthalidone, hypertension medications as directed. Follow up with Cardiology as directed. Family is  encouraged to weigh 2-3 times a week; family to purchase a scale.   Fatigue, weakness-secondary to heart failure, hypertension. Recommend PT to evaluate and treat due to weakness, impaired mobility.   Mood-ongoing assessment for depression/mood. Patient encouraged to partake of activities of interest. She is encouraged to verbalize her needs/concerns.   Follow up Palliative Care Visit: Palliative care will continue to follow for complex medical decision making, advance care planning, and clarification of goals. Return in 6 weeks or prn.  I spent 60 minutes providing this consultation. More than 50% of the time in this consultation was spent in counseling and care coordination.    PPS: 40%  HOSPICE ELIGIBILITY/DIAGNOSIS: TBD  Chief Complaint: Palliative Medicine follow up visit.   HISTORY OF PRESENT ILLNESS:  Kayla [name] is a 87 y.o. year old female  with hypertension, CKD stage 3b, CAD, aortic atherosclerosis, hyperlipidemia, paroxysmal atrial fibrillation, hypothyroidism, age related osteoporosis without current pathological fracture, GERD, hx of TIA.[name]  Patient reports she is doing okay although daughters report patient having a bad week, last week. She had episode with loose stool on Friday, cried during the day but was unable to state why she was crying. Patient endorses weakness, fatigue, required assist x 3 to get her up to go to bathroom. She is walking shorter distances, previously was able to ambulate down the hallway, to and from bathroom. No falls recently. She endorses shortness of breath with exertion, denies worsening. She also has LE edema; no change. Fair appetite endorsed; she has  been eating around 40% of her meals recently, previously she had been eating 100%. Family is checking her blood pressure daily; today b/p was 532/02, systolic pressure has been up to 334 and diastolic pressure usually in the 70's. A 10-point review of systems is negative, except for the pertinent  positives and negatives detailed in the HPI.   Patient received resting in recliner, legs elevated. Pleasant affect. Patient did become tearful at one point during visit. She denies feeling depressed or down.   History obtained from review of EMR, discussion with primary team, and interview with family, facility staff/caregiver and/or Kayla [name].  I reviewed available labs, medications, imaging, studies and related documents from the EMR.  Records reviewed and summarized above.    Physical Exam:  Pulse 68, resp 20, 150/64, sats 96% on room air Constitutional: NAD General: frail appearing EYES: anicteric sclera, lids intact, no discharge  ENMT: intact hearing, oral mucous membranes moist CV: S1S2, RRR, 1+ edema Pulmonary: LCTA, no increased work of breathing, no cough Abdomen: normo-active BS + 4 quadrants, soft and non tender GU: deferred MSK: moves all extremities, ambulatory Skin: warm and dry, no rashes or wounds on visible skin Neuro: generalized weakness, A & O x 3, grips equal bilaterally, CN II-XII grossly intact.  Psych: non-anxious affect Hem/lymph/immuno: no widespread bruising   Thank you for the opportunity to participate in the care of Kayla [name].  The palliative care team will continue to follow. Please call our office at [phone] if we can be of additional assistance.   Kayla Slocumb, NP   COVID-19 PATIENT SCREENING TOOL Asked and negative response unless otherwise noted:   Have you had symptoms of covid, tested positive or been in contact with someone with symptoms/positive test in the past 5-10 days? No

## 2021-03-09 ENCOUNTER — Other Ambulatory Visit: Payer: Self-pay | Admitting: Family

## 2021-03-09 ENCOUNTER — Telehealth: Payer: Self-pay

## 2021-03-09 NOTE — Telephone Encounter (Signed)
Reached out to [employer] [facility] to inquire if able to provide patient Physical Therapy.

## 2021-03-14 ENCOUNTER — Ambulatory Visit: Payer: Medicare Other | Admitting: Cardiovascular Disease

## 2021-03-25 ENCOUNTER — Other Ambulatory Visit: Payer: Medicare Other | Admitting: Student

## 2021-03-25 ENCOUNTER — Other Ambulatory Visit: Payer: Self-pay

## 2021-03-25 DIAGNOSIS — Z515 Encounter for palliative care: Secondary | ICD-10-CM

## 2021-03-25 DIAGNOSIS — R531 Weakness: Secondary | ICD-10-CM

## 2021-03-25 DIAGNOSIS — R634 Abnormal weight loss: Secondary | ICD-10-CM

## 2021-03-25 NOTE — Progress Notes (Signed)
[facility] Consult Note Telephone: [phone]  Fax: [phone]    Date of encounter: 03/25/21 12:41 PM PATIENT NAME: Kayla [name] [address] [place] [address]   [phone] (home)  DOB: [birth year] MRN: [id number] PRIMARY CARE PROVIDER:    Adin Hector, MD,  [place] [address] [phone]  REFERRING PROVIDER:   Adin Hector, MD [facility] [place],  [place] [address] [phone]  RESPONSIBLE PARTY:    Contact Information     Name Relation Home Work Mobile   [name],Kayla Daughter [phone]   [name], Kayla [phone]     Kayla [name] Daughter [phone]  [phone]        I met face to face with patient and family in  home. Palliative Care was asked to follow this patient by consultation request of  Kayla Hector, MD to address advance care planning and complex medical decision making. This is a follow up visit.                                   ASSESSMENT AND PLAN / RECOMMENDATIONS:   Advance Care Planning/Goals of Care: Goals include to maximize quality of life and symptom management. Our advance care planning conversation included a discussion about:    The value and importance of advance care planning  Experiences with loved ones who have been seriously ill or have died  Exploration of personal, cultural or spiritual beliefs that might influence medical decisions  Exploration of goals of care in the event of a sudden injury or illness  Identification and preparation of a healthcare agent  Decision not to resuscitate or to de-escalate disease focused treatments due to poor prognosis. CODE STATUS: DNR  Education provided on Palliative Medicine vs. Hospice services. Education provide on disease processes. Patient with continued decline. Discussed eligibility with hospice  medical director; patient is eligible for hospice evaluation. Family is in agreement.  Dr. Olin [name] office notified to see if Dr. Caryl [name] will serve as hospice attending. Received return call from [place], [place]. He states Dr. Caryl [name] will serve as patient's hospice attending.   Symptom Management/Plan:  Generalized weakness-secondary to CHF, CAD. Family to continue assisting with adl's. Use assistive device for transfers. Monitor for falls/safety. Patient is being referred for hospice evaluation.  Abnormal weight loss- patient continues with decline in appetite; she is eating 10-15% of meals. 12 pound weight loss in the past 2-4 weeks. Continue to offer foods patient enjoys.    Follow up Palliative Care Visit: Palliative care will continue to follow for complex medical decision making, advance care planning, and clarification of goals. Return PRN.   I spent 60 minutes providing this consultation. More than 50% of the time in this consultation was spent in counseling and care coordination.   PPS: 30%  HOSPICE ELIGIBILITY/DIAGNOSIS: CHF, abnormal weight loss/protein calorie malnutrition.  Chief Complaint: Palliative Medicine follow up visit.   HISTORY OF PRESENT ILLNESS:  Kayla [name] is a 87 y.o. year old female  with CHF, hypertension, CKD stage 3b, CAD, aortic atherosclerosis, hyperlipidemia, paroxysmal atrial fibrillation, hypothyroidism, age related osteoporosis without current pathological fracture, GERD, hx of TIA.  Since Monday, patient has had a marked decline. She is requiring more assistance physically, it took 3 people to transfer patient yesterday. Today, patient was able to ambulate a  couple of steps with assist x 2. She is now incontinent of bladder. She was eating around 40% of meals two weeks ago, now she is eating 10-15% of meals provided. She was 144 pounds, today she was 132 pounds; 12 pound loss in past 2-4 weeks. She is more withdrawn, sleeping at least 18 hours a day.  Family also reports patient being more tearful, crying at times. She denies pain. She does endorse worsening shortness of breath Her blood pressures have been labile; ranging from 25'O systolic to over 037 systolic. She was seen by EMS on Monday and family declined transport to hospital. Patient would like to be managed in the home and have comfort measures. She does have a U/A pending per PCP but denies any urinary complaints.   Blood pressure on Monday 205/88, today 161/58. Blood pressures have been 82/51 and 103/52 in the past week on the lower end. A 10-point review of systems is negative, except for the pertinent positives and negatives detailed in the HPI.    History obtained from review of EMR, discussion with primary team, and interview with family, facility staff/caregiver and/or Kayla [name].  I reviewed available labs, medications, imaging, studies and related documents from the EMR.  Records reviewed and summarized above.    Physical Exam: Weight: 132 pounds today Pulse 60, resp 18, b/p 161/58, sats 95% on room air Constitutional: NAD General: frail appearing, visibly weak EYES: anicteric sclera, lids intact, no discharge  ENMT: intact hearing, oral mucous membranes moist CV: S1S2, irregular rhythm, trace LE edema Pulmonary: LCTA, shortness of breath with exertion, no cough Abdomen:  normo-active BS + 4 quadrants, soft and non tender GU: deferred MSK: moves all extremities Skin: cool and dry, no rashes or wounds on visible skin Neuro: generalized weakness, A & O x 3 Psych: non-anxious affect, flat affect Hem/lymph/immuno: no widespread bruising   Thank you for the opportunity to participate in the care of [name].  The palliative care team will continue to follow. Please call our office at [phone] if we can be of additional assistance.   Kayla Slocumb, NP   COVID-19 PATIENT SCREENING TOOL Asked and negative response unless otherwise noted:   Have you had symptoms  of covid, tested positive or been in contact with someone with symptoms/positive test in the past 5-10 days? No

## 2021-05-17 ENCOUNTER — Ambulatory Visit (INDEPENDENT_AMBULATORY_CARE_PROVIDER_SITE_OTHER): Payer: Medicare Other | Admitting: Vascular Surgery

## 2021-05-17 ENCOUNTER — Encounter (INDEPENDENT_AMBULATORY_CARE_PROVIDER_SITE_OTHER): Payer: Medicare Other

## 2021-11-14 DEATH — deceased
